# Patient Record
Sex: Female | Born: 1975 | Race: White | Hispanic: Yes | Marital: Married | Smoking: Never smoker
Health system: Southern US, Community
[De-identification: ages and names within clinical notes are randomized; demographics above are authoritative.]

## PROBLEM LIST (undated history)

## (undated) DIAGNOSIS — Z862 Personal history of diseases of the blood and blood-forming organs and certain disorders involving the immune mechanism: Secondary | ICD-10-CM

## (undated) DIAGNOSIS — K8020 Calculus of gallbladder without cholecystitis without obstruction: Secondary | ICD-10-CM

## (undated) DIAGNOSIS — Z8659 Personal history of other mental and behavioral disorders: Secondary | ICD-10-CM

## (undated) DIAGNOSIS — J329 Chronic sinusitis, unspecified: Secondary | ICD-10-CM

## (undated) DIAGNOSIS — L0293 Carbuncle, unspecified: Secondary | ICD-10-CM

## (undated) DIAGNOSIS — Z8759 Personal history of other complications of pregnancy, childbirth and the puerperium: Secondary | ICD-10-CM

## (undated) DIAGNOSIS — Z8669 Personal history of other diseases of the nervous system and sense organs: Secondary | ICD-10-CM

## (undated) DIAGNOSIS — I8390 Asymptomatic varicose veins of unspecified lower extremity: Secondary | ICD-10-CM

## (undated) HISTORY — DX: Calculus of gallbladder without cholecystitis without obstruction: K80.20

## (undated) HISTORY — PX: NO PAST SURGERIES: SHX2092

---

## 2000-12-12 LAB — HEMOGLOBINOPATHY EVALUATION: Hemoglobin, Elp: NORMAL

## 2000-12-19 ENCOUNTER — Ambulatory Visit (HOSPITAL_COMMUNITY): Admission: RE | Admit: 2000-12-19 | Discharge: 2000-12-19 | Payer: Self-pay | Admitting: Obstetrics

## 2001-01-18 ENCOUNTER — Inpatient Hospital Stay (HOSPITAL_COMMUNITY): Admission: AD | Admit: 2001-01-18 | Discharge: 2001-01-19 | Payer: Self-pay | Admitting: Obstetrics

## 2003-03-24 ENCOUNTER — Inpatient Hospital Stay (HOSPITAL_COMMUNITY): Admission: AD | Admit: 2003-03-24 | Discharge: 2003-03-24 | Payer: Self-pay | Admitting: Obstetrics & Gynecology

## 2003-05-20 ENCOUNTER — Encounter: Admission: RE | Admit: 2003-05-20 | Discharge: 2003-05-20 | Payer: Self-pay | Admitting: *Deleted

## 2003-06-10 ENCOUNTER — Encounter: Admission: RE | Admit: 2003-06-10 | Discharge: 2003-06-10 | Payer: Self-pay | Admitting: *Deleted

## 2003-06-17 ENCOUNTER — Encounter: Admission: RE | Admit: 2003-06-17 | Discharge: 2003-06-17 | Payer: Self-pay | Admitting: *Deleted

## 2003-06-22 ENCOUNTER — Inpatient Hospital Stay (HOSPITAL_COMMUNITY): Admission: AD | Admit: 2003-06-22 | Discharge: 2003-06-24 | Payer: Self-pay | Admitting: Family Medicine

## 2003-06-22 ENCOUNTER — Inpatient Hospital Stay (HOSPITAL_COMMUNITY): Admission: AD | Admit: 2003-06-22 | Discharge: 2003-06-22 | Payer: Self-pay | Admitting: Family Medicine

## 2005-05-23 DIAGNOSIS — Z8759 Personal history of other complications of pregnancy, childbirth and the puerperium: Secondary | ICD-10-CM

## 2005-05-23 HISTORY — DX: Personal history of other complications of pregnancy, childbirth and the puerperium: Z87.59

## 2006-01-19 ENCOUNTER — Inpatient Hospital Stay (HOSPITAL_COMMUNITY): Admission: AD | Admit: 2006-01-19 | Discharge: 2006-01-20 | Payer: Self-pay | Admitting: Family Medicine

## 2006-01-20 ENCOUNTER — Inpatient Hospital Stay (HOSPITAL_COMMUNITY): Admission: AD | Admit: 2006-01-20 | Discharge: 2006-01-21 | Payer: Self-pay | Admitting: Gynecology

## 2006-01-22 ENCOUNTER — Encounter (INDEPENDENT_AMBULATORY_CARE_PROVIDER_SITE_OTHER): Payer: Self-pay | Admitting: *Deleted

## 2006-01-22 ENCOUNTER — Inpatient Hospital Stay (HOSPITAL_COMMUNITY): Admission: AD | Admit: 2006-01-22 | Discharge: 2006-01-22 | Payer: Self-pay | Admitting: Obstetrics and Gynecology

## 2006-02-08 ENCOUNTER — Ambulatory Visit: Payer: Self-pay | Admitting: Obstetrics and Gynecology

## 2007-05-23 ENCOUNTER — Inpatient Hospital Stay (HOSPITAL_COMMUNITY): Admission: RE | Admit: 2007-05-23 | Discharge: 2007-05-23 | Payer: Self-pay | Admitting: Obstetrics and Gynecology

## 2007-06-21 ENCOUNTER — Ambulatory Visit (HOSPITAL_COMMUNITY): Admission: RE | Admit: 2007-06-21 | Discharge: 2007-06-21 | Payer: Self-pay | Admitting: Family Medicine

## 2007-08-12 ENCOUNTER — Inpatient Hospital Stay (HOSPITAL_COMMUNITY): Admission: AD | Admit: 2007-08-12 | Discharge: 2007-08-12 | Payer: Self-pay | Admitting: Obstetrics & Gynecology

## 2007-08-12 ENCOUNTER — Ambulatory Visit: Payer: Self-pay | Admitting: Obstetrics and Gynecology

## 2007-09-17 ENCOUNTER — Inpatient Hospital Stay (HOSPITAL_COMMUNITY): Admission: AD | Admit: 2007-09-17 | Discharge: 2007-09-17 | Payer: Self-pay | Admitting: Obstetrics & Gynecology

## 2007-09-17 ENCOUNTER — Ambulatory Visit: Payer: Self-pay | Admitting: *Deleted

## 2007-09-19 ENCOUNTER — Ambulatory Visit: Payer: Self-pay | Admitting: *Deleted

## 2007-09-19 ENCOUNTER — Inpatient Hospital Stay (HOSPITAL_COMMUNITY): Admission: AD | Admit: 2007-09-19 | Discharge: 2007-09-21 | Payer: Self-pay | Admitting: Obstetrics and Gynecology

## 2007-10-27 ENCOUNTER — Inpatient Hospital Stay (HOSPITAL_COMMUNITY): Admission: AD | Admit: 2007-10-27 | Discharge: 2007-10-27 | Payer: Self-pay | Admitting: Obstetrics and Gynecology

## 2007-11-02 ENCOUNTER — Ambulatory Visit (HOSPITAL_COMMUNITY): Admission: RE | Admit: 2007-11-02 | Discharge: 2007-11-02 | Payer: Self-pay | Admitting: Family Medicine

## 2007-11-05 ENCOUNTER — Inpatient Hospital Stay (HOSPITAL_COMMUNITY): Admission: AD | Admit: 2007-11-05 | Discharge: 2007-11-08 | Payer: Self-pay | Admitting: Obstetrics

## 2007-11-05 ENCOUNTER — Ambulatory Visit: Payer: Self-pay | Admitting: *Deleted

## 2008-02-02 ENCOUNTER — Inpatient Hospital Stay (HOSPITAL_COMMUNITY): Admission: AD | Admit: 2008-02-02 | Discharge: 2008-02-02 | Payer: Self-pay | Admitting: Obstetrics and Gynecology

## 2008-04-07 ENCOUNTER — Emergency Department (HOSPITAL_COMMUNITY): Admission: EM | Admit: 2008-04-07 | Discharge: 2008-04-08 | Payer: Self-pay | Admitting: Emergency Medicine

## 2008-05-23 DIAGNOSIS — L0293 Carbuncle, unspecified: Secondary | ICD-10-CM

## 2008-05-23 DIAGNOSIS — Z8659 Personal history of other mental and behavioral disorders: Secondary | ICD-10-CM

## 2008-05-23 DIAGNOSIS — L0292 Furuncle, unspecified: Secondary | ICD-10-CM

## 2008-05-23 HISTORY — DX: Furuncle, unspecified: L02.92

## 2008-05-23 HISTORY — DX: Personal history of other mental and behavioral disorders: Z86.59

## 2008-05-23 HISTORY — DX: Carbuncle, unspecified: L02.93

## 2008-06-17 ENCOUNTER — Emergency Department (HOSPITAL_COMMUNITY): Admission: EM | Admit: 2008-06-17 | Discharge: 2008-06-17 | Payer: Self-pay | Admitting: Emergency Medicine

## 2008-06-23 ENCOUNTER — Ambulatory Visit: Payer: Self-pay | Admitting: Nurse Practitioner

## 2008-06-23 DIAGNOSIS — J45909 Unspecified asthma, uncomplicated: Secondary | ICD-10-CM | POA: Insufficient documentation

## 2008-06-24 LAB — CONVERTED CEMR LAB
ALT: 19 units/L (ref 0–35)
AST: 11 units/L (ref 0–37)
BUN: 11 mg/dL (ref 6–23)
CO2: 24 meq/L (ref 19–32)
Creatinine, Ser: 0.62 mg/dL (ref 0.40–1.20)
Eosinophils Relative: 4 % (ref 0–5)
Glucose, Bld: 76 mg/dL (ref 70–99)
Neutrophils Relative %: 43 % (ref 43–77)
Platelets: 277 10*3/uL (ref 150–400)
RBC: 5.08 M/uL (ref 3.87–5.11)
Total Bilirubin: 0.5 mg/dL (ref 0.3–1.2)
Total Protein: 7.4 g/dL (ref 6.0–8.3)
WBC: 11.3 10*3/uL — ABNORMAL HIGH (ref 4.0–10.5)

## 2008-06-24 IMAGING — US US OB LIMITED
1 series · 13 of 13 positions shown · non-contrast
Comparison: none

OBSTETRICAL ULTRASOUND:
 This ultrasound exam was performed in the [HOSPITAL] Ultrasound Department.  The OB US report was generated in the AS system, and faxed to the ordering physician.  This report is also available in [REDACTED] PACS.

[Series 1: us ob limited · 0.30mm/px · 13 of 13 slices shown]
[im 1/13]
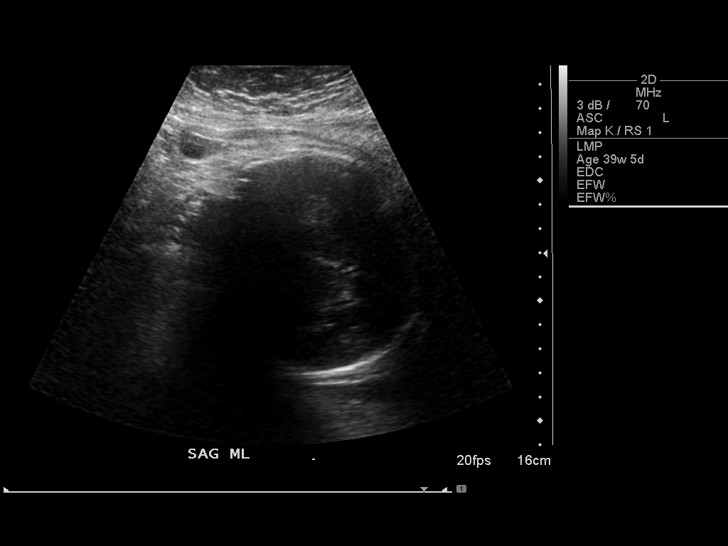
[im 2/13]
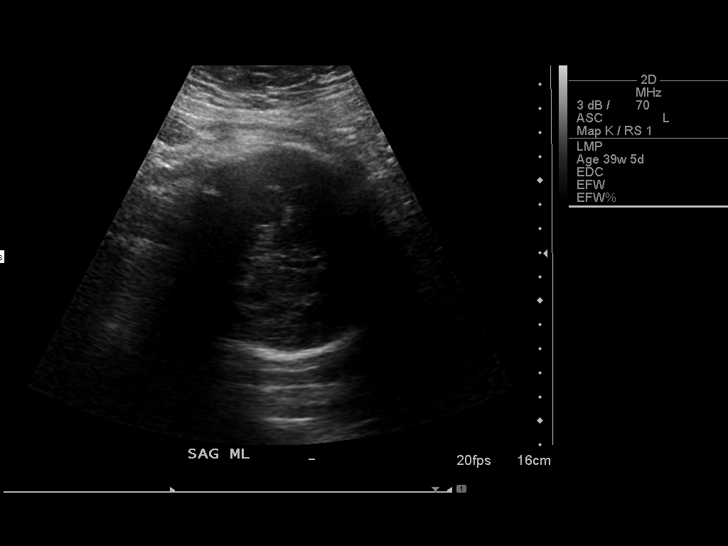
[im 3/13]
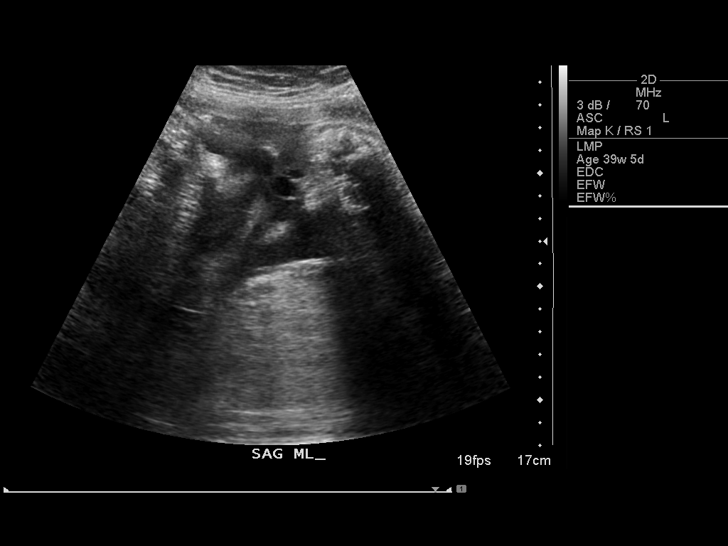
[im 4/13]
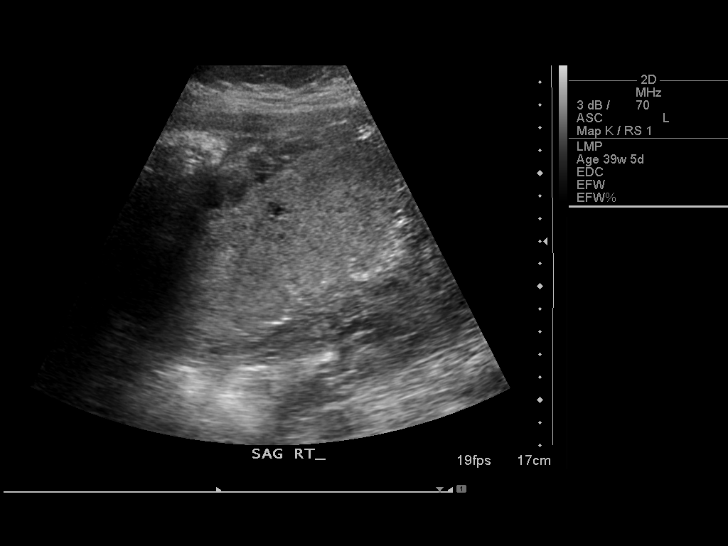
[im 5/13]
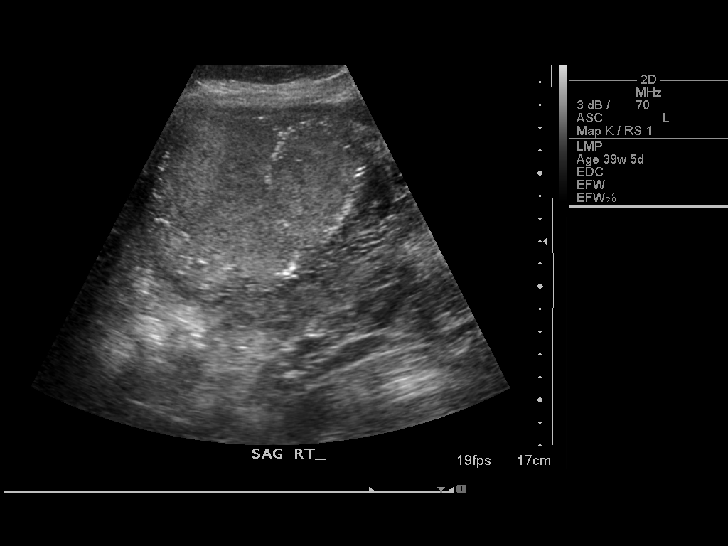
[im 6/13]
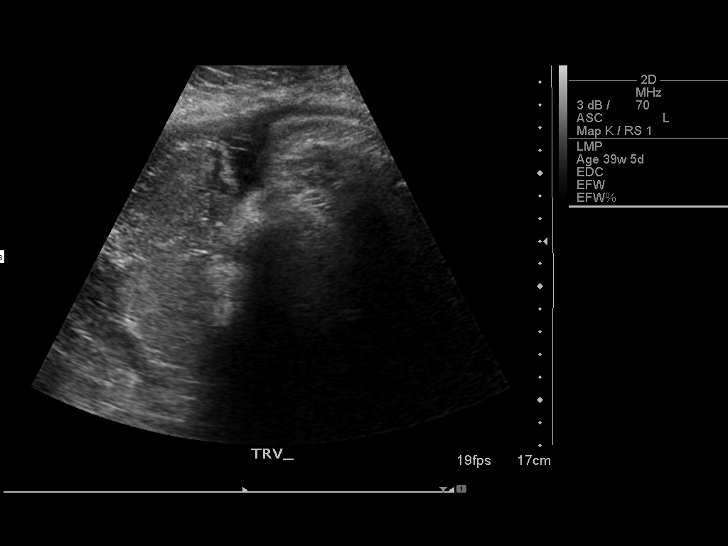
[im 7/13]
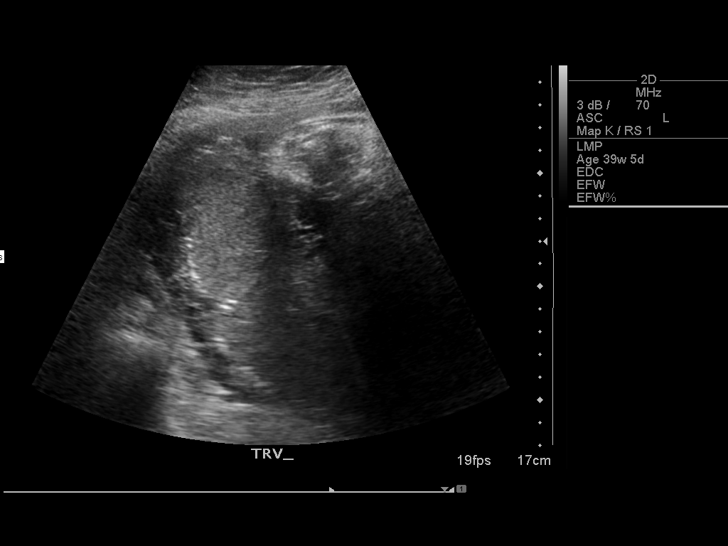
[im 8/13]
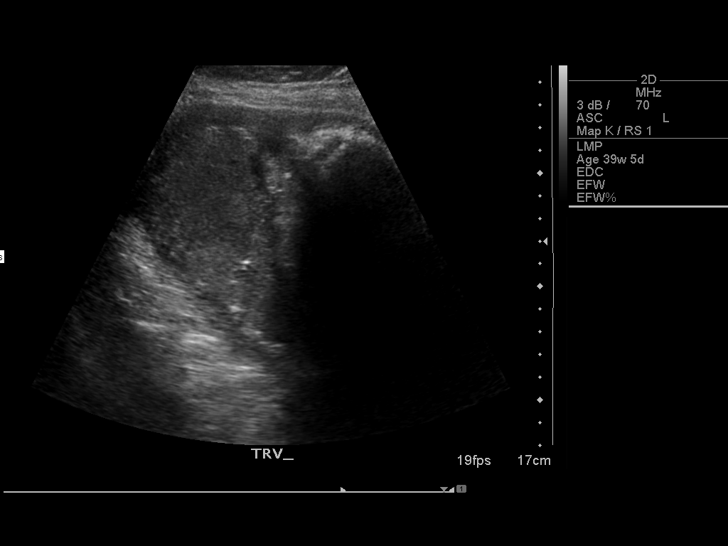
[im 9/13]
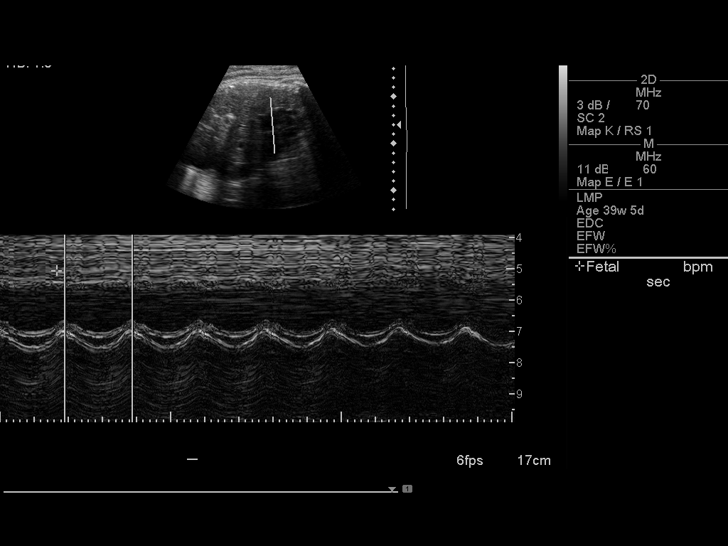
[im 10/13]
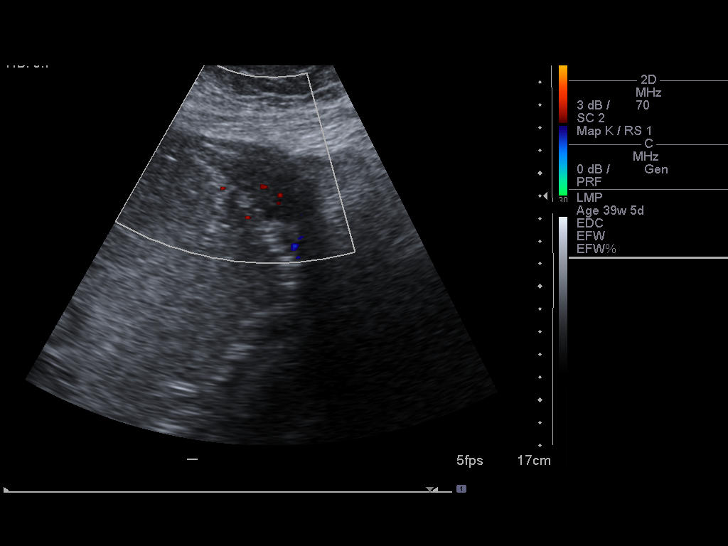
[im 11/13]
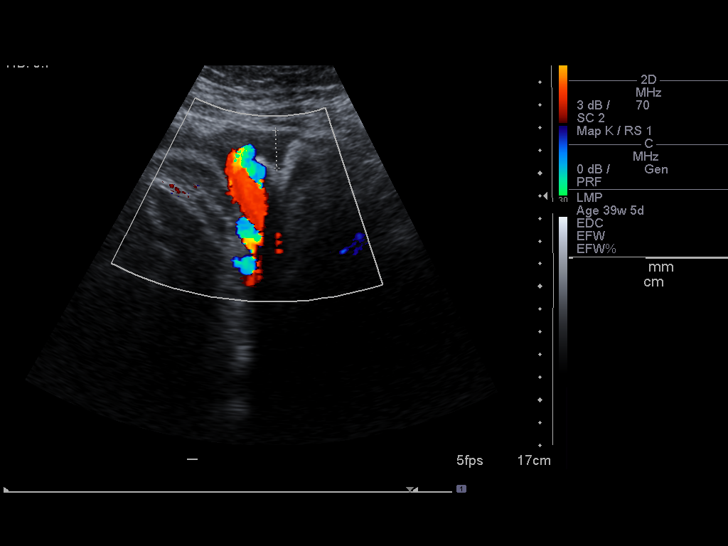
[im 12/13]
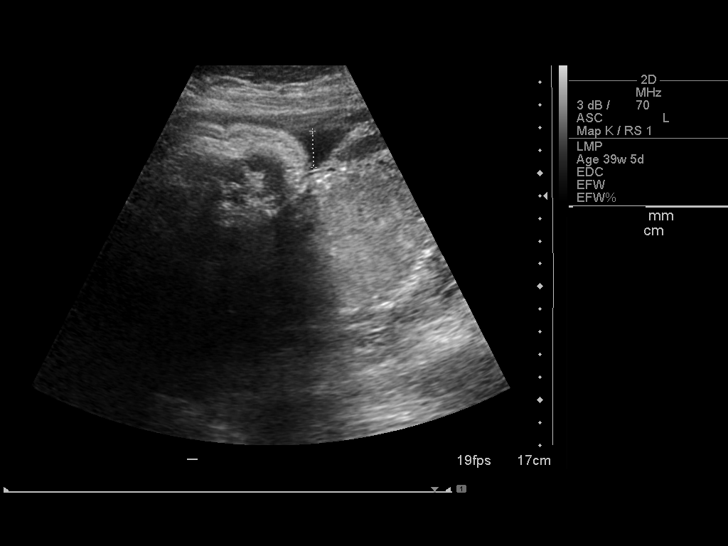
[im 13/13]
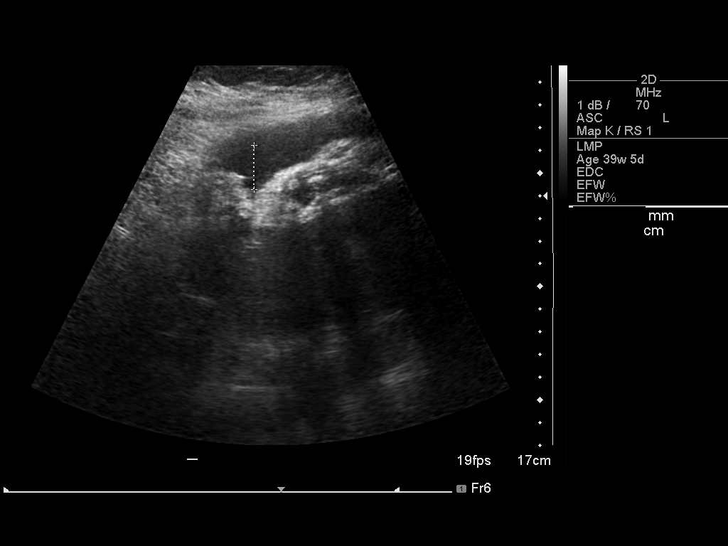

[13 of 13 positions shown; findings below may reference images not displayed]

IMPRESSION: See AS Obstetric US report.

## 2008-06-25 ENCOUNTER — Ambulatory Visit: Payer: Self-pay | Admitting: *Deleted

## 2008-06-26 ENCOUNTER — Encounter (INDEPENDENT_AMBULATORY_CARE_PROVIDER_SITE_OTHER): Payer: Self-pay | Admitting: Nurse Practitioner

## 2008-06-26 ENCOUNTER — Ambulatory Visit (HOSPITAL_COMMUNITY): Admission: RE | Admit: 2008-06-26 | Discharge: 2008-06-26 | Payer: Self-pay | Admitting: Internal Medicine

## 2008-07-18 ENCOUNTER — Telehealth (INDEPENDENT_AMBULATORY_CARE_PROVIDER_SITE_OTHER): Payer: Self-pay | Admitting: Nurse Practitioner

## 2008-08-08 ENCOUNTER — Inpatient Hospital Stay (HOSPITAL_COMMUNITY): Admission: EM | Admit: 2008-08-08 | Discharge: 2008-08-10 | Payer: Self-pay | Admitting: Emergency Medicine

## 2008-08-25 ENCOUNTER — Ambulatory Visit: Payer: Self-pay | Admitting: Nurse Practitioner

## 2008-08-25 DIAGNOSIS — R12 Heartburn: Secondary | ICD-10-CM

## 2008-09-01 ENCOUNTER — Telehealth (INDEPENDENT_AMBULATORY_CARE_PROVIDER_SITE_OTHER): Payer: Self-pay | Admitting: Internal Medicine

## 2008-09-08 ENCOUNTER — Ambulatory Visit: Payer: Self-pay | Admitting: Nurse Practitioner

## 2008-09-08 ENCOUNTER — Encounter (INDEPENDENT_AMBULATORY_CARE_PROVIDER_SITE_OTHER): Payer: Self-pay | Admitting: Nurse Practitioner

## 2008-09-08 DIAGNOSIS — J329 Chronic sinusitis, unspecified: Secondary | ICD-10-CM | POA: Insufficient documentation

## 2008-09-08 LAB — CONVERTED CEMR LAB
Blood in Urine, dipstick: NEGATIVE
Chlamydia, DNA Probe: NEGATIVE
Eosinophils Absolute: 1.1 10*3/uL — ABNORMAL HIGH (ref 0.0–0.7)
GC Probe Amp, Genital: NEGATIVE
Glucose, Urine, Semiquant: NEGATIVE
HCT: 42.6 % (ref 36.0–46.0)
Hemoglobin: 14.2 g/dL (ref 12.0–15.0)
KOH Prep: NEGATIVE
LDL Cholesterol: 72 mg/dL (ref 0–99)
MCHC: 33.3 g/dL (ref 30.0–36.0)
Neutro Abs: 4.3 10*3/uL (ref 1.7–7.7)
Nitrite: NEGATIVE
Protein, U semiquant: 30
Specific Gravity, Urine: 1.02
Urobilinogen, UA: 1
VLDL: 22 mg/dL (ref 0–40)
WBC Urine, dipstick: NEGATIVE
WBC: 8.3 10*3/uL (ref 4.0–10.5)

## 2008-09-09 ENCOUNTER — Encounter (INDEPENDENT_AMBULATORY_CARE_PROVIDER_SITE_OTHER): Payer: Self-pay | Admitting: Nurse Practitioner

## 2008-09-17 ENCOUNTER — Telehealth (INDEPENDENT_AMBULATORY_CARE_PROVIDER_SITE_OTHER): Payer: Self-pay | Admitting: Nurse Practitioner

## 2008-10-22 ENCOUNTER — Ambulatory Visit: Payer: Self-pay | Admitting: Nurse Practitioner

## 2008-10-22 ENCOUNTER — Telehealth (INDEPENDENT_AMBULATORY_CARE_PROVIDER_SITE_OTHER): Payer: Self-pay | Admitting: Nurse Practitioner

## 2008-10-22 DIAGNOSIS — J309 Allergic rhinitis, unspecified: Secondary | ICD-10-CM | POA: Insufficient documentation

## 2008-10-22 DIAGNOSIS — L0293 Carbuncle, unspecified: Secondary | ICD-10-CM

## 2008-10-22 DIAGNOSIS — L0292 Furuncle, unspecified: Secondary | ICD-10-CM | POA: Insufficient documentation

## 2008-11-20 ENCOUNTER — Telehealth (INDEPENDENT_AMBULATORY_CARE_PROVIDER_SITE_OTHER): Payer: Self-pay | Admitting: *Deleted

## 2008-12-15 ENCOUNTER — Ambulatory Visit: Payer: Self-pay | Admitting: Nurse Practitioner

## 2008-12-15 DIAGNOSIS — F5089 Other specified eating disorder: Secondary | ICD-10-CM

## 2008-12-15 LAB — CONVERTED CEMR LAB
Bilirubin Urine: NEGATIVE
Glucose, Urine, Semiquant: NEGATIVE
Ketones, urine, test strip: NEGATIVE
Nitrite: NEGATIVE
Urobilinogen, UA: 0.2
WBC Urine, dipstick: NEGATIVE
pH: 5

## 2009-02-18 ENCOUNTER — Ambulatory Visit: Payer: Self-pay | Admitting: Nurse Practitioner

## 2009-02-18 LAB — CONVERTED CEMR LAB
Blood in Urine, dipstick: NEGATIVE
Nitrite: NEGATIVE

## 2009-02-24 ENCOUNTER — Ambulatory Visit (HOSPITAL_COMMUNITY): Admission: RE | Admit: 2009-02-24 | Discharge: 2009-02-24 | Payer: Self-pay | Admitting: Internal Medicine

## 2009-03-16 ENCOUNTER — Ambulatory Visit: Payer: Self-pay | Admitting: Nurse Practitioner

## 2009-03-17 ENCOUNTER — Encounter (INDEPENDENT_AMBULATORY_CARE_PROVIDER_SITE_OTHER): Payer: Self-pay | Admitting: Nurse Practitioner

## 2009-03-18 ENCOUNTER — Telehealth (INDEPENDENT_AMBULATORY_CARE_PROVIDER_SITE_OTHER): Payer: Self-pay | Admitting: Nurse Practitioner

## 2009-03-18 DIAGNOSIS — D649 Anemia, unspecified: Secondary | ICD-10-CM | POA: Insufficient documentation

## 2009-03-18 LAB — CONVERTED CEMR LAB
Eosinophils Absolute: 0.3 10*3/uL (ref 0.0–0.7)
Hemoglobin: 9.2 g/dL — ABNORMAL LOW (ref 12.0–15.0)
Lymphocytes Relative: 30 % (ref 12–46)
MCHC: 29.3 g/dL — ABNORMAL LOW (ref 30.0–36.0)
MCV: 76.8 fL — ABNORMAL LOW (ref 78.0–100.0)
Monocytes Absolute: 0.6 10*3/uL (ref 0.1–1.0)
Retic Ct Pct: 1 % (ref 0.4–3.1)

## 2009-03-19 ENCOUNTER — Inpatient Hospital Stay (HOSPITAL_COMMUNITY): Admission: AD | Admit: 2009-03-19 | Discharge: 2009-03-19 | Payer: Self-pay | Admitting: Obstetrics & Gynecology

## 2009-03-20 ENCOUNTER — Encounter (INDEPENDENT_AMBULATORY_CARE_PROVIDER_SITE_OTHER): Payer: Self-pay | Admitting: Nurse Practitioner

## 2009-03-20 ENCOUNTER — Telehealth (INDEPENDENT_AMBULATORY_CARE_PROVIDER_SITE_OTHER): Payer: Self-pay | Admitting: Nurse Practitioner

## 2009-03-23 ENCOUNTER — Emergency Department (HOSPITAL_COMMUNITY): Admission: EM | Admit: 2009-03-23 | Discharge: 2009-03-23 | Payer: Self-pay | Admitting: Emergency Medicine

## 2009-03-23 ENCOUNTER — Ambulatory Visit: Payer: Self-pay | Admitting: Nurse Practitioner

## 2009-03-23 ENCOUNTER — Encounter (INDEPENDENT_AMBULATORY_CARE_PROVIDER_SITE_OTHER): Payer: Self-pay | Admitting: Nurse Practitioner

## 2009-03-24 LAB — CONVERTED CEMR LAB
HCT: 32.5 % — ABNORMAL LOW (ref 36.0–46.0)
Hemoglobin: 9.5 g/dL — ABNORMAL LOW (ref 12.0–15.0)
MCHC: 29.2 g/dL — ABNORMAL LOW (ref 30.0–36.0)
Platelets: 278 10*3/uL (ref 150–400)
Retic Ct Pct: 1.4 % (ref 0.4–3.1)
WBC: 7.6 10*3/uL (ref 4.0–10.5)

## 2009-04-08 ENCOUNTER — Ambulatory Visit: Payer: Self-pay | Admitting: Nurse Practitioner

## 2009-04-08 DIAGNOSIS — J069 Acute upper respiratory infection, unspecified: Secondary | ICD-10-CM | POA: Insufficient documentation

## 2009-04-08 LAB — CONVERTED CEMR LAB: Rapid Strep: NEGATIVE

## 2009-04-29 ENCOUNTER — Ambulatory Visit: Payer: Self-pay | Admitting: Nurse Practitioner

## 2009-04-29 LAB — CONVERTED CEMR LAB
Hemoglobin: 13.8 g/dL (ref 12.0–15.0)
MCHC: 31.6 g/dL (ref 30.0–36.0)
Platelets: 251 10*3/uL (ref 150–400)

## 2009-05-05 ENCOUNTER — Telehealth (INDEPENDENT_AMBULATORY_CARE_PROVIDER_SITE_OTHER): Payer: Self-pay | Admitting: *Deleted

## 2009-05-29 ENCOUNTER — Telehealth (INDEPENDENT_AMBULATORY_CARE_PROVIDER_SITE_OTHER): Payer: Self-pay | Admitting: *Deleted

## 2009-06-04 ENCOUNTER — Ambulatory Visit: Payer: Self-pay | Admitting: Nurse Practitioner

## 2009-07-01 ENCOUNTER — Telehealth (INDEPENDENT_AMBULATORY_CARE_PROVIDER_SITE_OTHER): Payer: Self-pay | Admitting: Nurse Practitioner

## 2009-07-03 ENCOUNTER — Ambulatory Visit: Payer: Self-pay | Admitting: Nurse Practitioner

## 2009-07-06 ENCOUNTER — Ambulatory Visit: Payer: Self-pay | Admitting: Nurse Practitioner

## 2009-07-20 ENCOUNTER — Ambulatory Visit: Payer: Self-pay | Admitting: Nurse Practitioner

## 2009-07-20 DIAGNOSIS — R51 Headache: Secondary | ICD-10-CM

## 2009-07-20 DIAGNOSIS — R519 Headache, unspecified: Secondary | ICD-10-CM | POA: Insufficient documentation

## 2009-07-23 ENCOUNTER — Telehealth (INDEPENDENT_AMBULATORY_CARE_PROVIDER_SITE_OTHER): Payer: Self-pay | Admitting: Nurse Practitioner

## 2009-07-23 ENCOUNTER — Ambulatory Visit (HOSPITAL_COMMUNITY): Admission: RE | Admit: 2009-07-23 | Discharge: 2009-07-23 | Payer: Self-pay | Admitting: Internal Medicine

## 2009-07-27 ENCOUNTER — Ambulatory Visit: Payer: Self-pay | Admitting: Internal Medicine

## 2009-08-05 ENCOUNTER — Telehealth (INDEPENDENT_AMBULATORY_CARE_PROVIDER_SITE_OTHER): Payer: Self-pay | Admitting: Nurse Practitioner

## 2009-08-10 ENCOUNTER — Encounter (INDEPENDENT_AMBULATORY_CARE_PROVIDER_SITE_OTHER): Payer: Self-pay | Admitting: Nurse Practitioner

## 2009-09-02 ENCOUNTER — Telehealth (INDEPENDENT_AMBULATORY_CARE_PROVIDER_SITE_OTHER): Payer: Self-pay | Admitting: Nurse Practitioner

## 2009-09-03 ENCOUNTER — Ambulatory Visit: Payer: Self-pay | Admitting: Nurse Practitioner

## 2009-09-03 DIAGNOSIS — B373 Candidiasis of vulva and vagina: Secondary | ICD-10-CM

## 2009-09-03 LAB — CONVERTED CEMR LAB
Beta hcg, urine, semiquantitative: NEGATIVE
Blood in Urine, dipstick: NEGATIVE
KOH Prep: NEGATIVE
Ketones, urine, test strip: NEGATIVE
Nitrite: NEGATIVE
Urobilinogen, UA: 0.2

## 2009-09-28 ENCOUNTER — Ambulatory Visit: Payer: Self-pay | Admitting: Nurse Practitioner

## 2009-10-28 ENCOUNTER — Ambulatory Visit: Payer: Self-pay | Admitting: Nurse Practitioner

## 2009-10-28 DIAGNOSIS — K047 Periapical abscess without sinus: Secondary | ICD-10-CM

## 2009-11-01 ENCOUNTER — Emergency Department (HOSPITAL_COMMUNITY): Admission: EM | Admit: 2009-11-01 | Discharge: 2009-11-01 | Payer: Self-pay | Admitting: Emergency Medicine

## 2009-11-25 ENCOUNTER — Ambulatory Visit: Payer: Self-pay | Admitting: Obstetrics and Gynecology

## 2009-11-25 ENCOUNTER — Inpatient Hospital Stay (HOSPITAL_COMMUNITY): Admission: AD | Admit: 2009-11-25 | Discharge: 2009-11-25 | Payer: Self-pay | Admitting: Obstetrics & Gynecology

## 2009-12-21 ENCOUNTER — Telehealth (INDEPENDENT_AMBULATORY_CARE_PROVIDER_SITE_OTHER): Payer: Self-pay | Admitting: Nurse Practitioner

## 2009-12-28 ENCOUNTER — Ambulatory Visit: Payer: Self-pay | Admitting: Nurse Practitioner

## 2009-12-28 ENCOUNTER — Telehealth (INDEPENDENT_AMBULATORY_CARE_PROVIDER_SITE_OTHER): Payer: Self-pay | Admitting: Nurse Practitioner

## 2009-12-31 ENCOUNTER — Telehealth (INDEPENDENT_AMBULATORY_CARE_PROVIDER_SITE_OTHER): Payer: Self-pay | Admitting: Nurse Practitioner

## 2010-01-01 ENCOUNTER — Telehealth (INDEPENDENT_AMBULATORY_CARE_PROVIDER_SITE_OTHER): Payer: Self-pay | Admitting: Nurse Practitioner

## 2010-01-05 ENCOUNTER — Emergency Department (HOSPITAL_COMMUNITY): Admission: EM | Admit: 2010-01-05 | Discharge: 2010-01-05 | Payer: Self-pay | Admitting: Emergency Medicine

## 2010-01-13 ENCOUNTER — Emergency Department (HOSPITAL_COMMUNITY): Admission: EM | Admit: 2010-01-13 | Discharge: 2010-01-13 | Payer: Self-pay | Admitting: Emergency Medicine

## 2010-02-17 ENCOUNTER — Ambulatory Visit: Payer: Self-pay | Admitting: Nurse Practitioner

## 2010-02-17 LAB — CONVERTED CEMR LAB
Albumin: 4.3 g/dL (ref 3.5–5.2)
Alkaline Phosphatase: 101 units/L (ref 39–117)
BUN: 13 mg/dL (ref 6–23)
CO2: 20 meq/L (ref 19–32)
Calcium: 9.1 mg/dL (ref 8.4–10.5)
Chlamydia, DNA Probe: NEGATIVE
Chloride: 104 meq/L (ref 96–112)
Eosinophils Absolute: 0.6 10*3/uL (ref 0.0–0.7)
GC Probe Amp, Genital: NEGATIVE
Glucose, Bld: 88 mg/dL (ref 70–99)
HDL goal, serum: 40 mg/dL
Ketones, urine, test strip: NEGATIVE
LDL Goal: 160 mg/dL
Lymphocytes Relative: 35 % (ref 12–46)
Lymphs Abs: 3.3 10*3/uL (ref 0.7–4.0)
Monocytes Relative: 6 % (ref 3–12)
Neutro Abs: 4.9 10*3/uL (ref 1.7–7.7)
Neutrophils Relative %: 53 % (ref 43–77)
Nitrite: NEGATIVE
Platelets: 196 10*3/uL (ref 150–400)
Potassium: 3.7 meq/L (ref 3.5–5.3)
RBC: 4.49 M/uL (ref 3.87–5.11)
Sodium: 139 meq/L (ref 135–145)
Specific Gravity, Urine: <1.005
Total Protein: 6.8 g/dL (ref 6.0–8.3)
Urobilinogen, UA: 0.2
WBC Urine, dipstick: NEGATIVE
WBC: 9.3 10*3/uL (ref 4.0–10.5)

## 2010-02-18 ENCOUNTER — Encounter (INDEPENDENT_AMBULATORY_CARE_PROVIDER_SITE_OTHER): Payer: Self-pay | Admitting: Nurse Practitioner

## 2010-02-23 LAB — CONVERTED CEMR LAB: Pap Smear: NEGATIVE

## 2010-03-12 IMAGING — CT CT HEAD W/O CM
1 series · 16 of 30 positions shown, 20 images · non-contrast
Comparison: None

CLINICAL DATA: Headache

CT HEAD WITHOUT CONTRAST
TECHNIQUE: Contiguous axial images were obtained from the base of
the skull through the vertex without contrast.

[Series 2: head routine 4.8 h37s · axial · 0.43mm/px · z∈[-162,-33]mm · 16 of 30 slices shown, 20 images]
[im 2/30  brain]
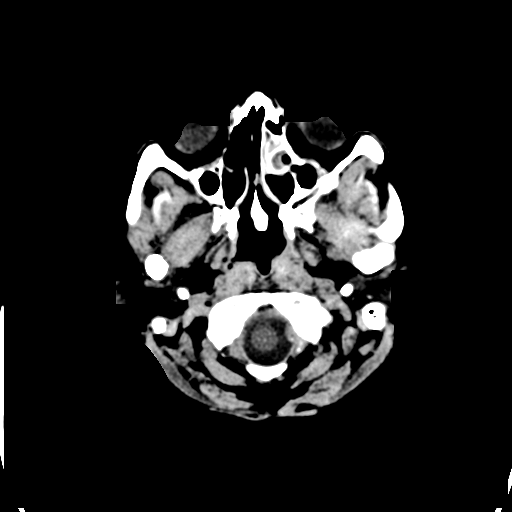
[im 2/30  bone]
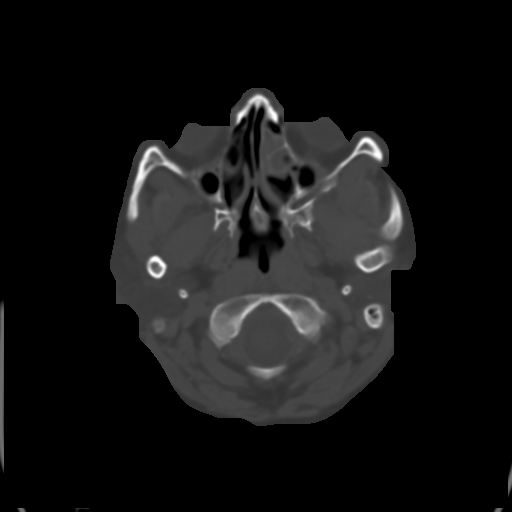
[im 4/30  brain]
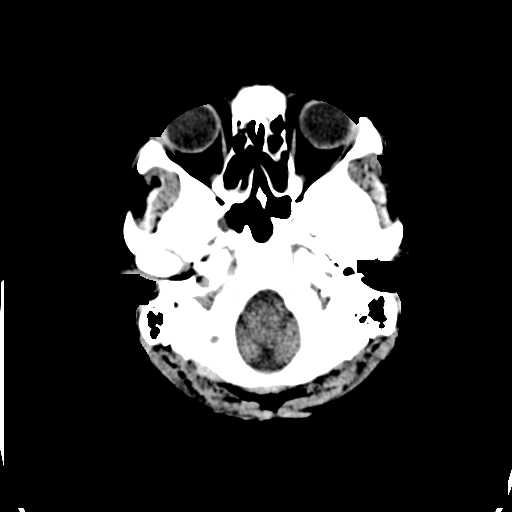
[im 6/30  brain]
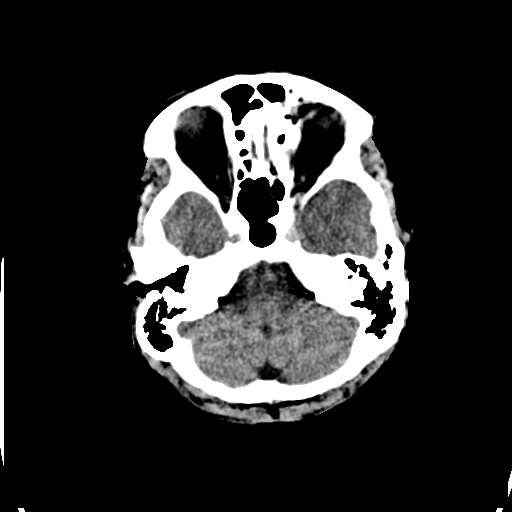
[im 8/30  brain]
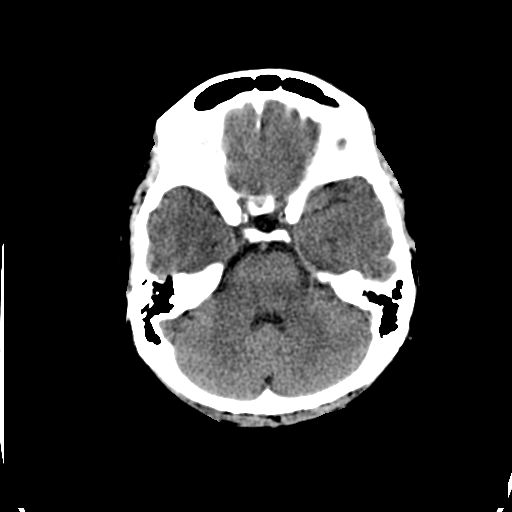
[im 9/30  brain]
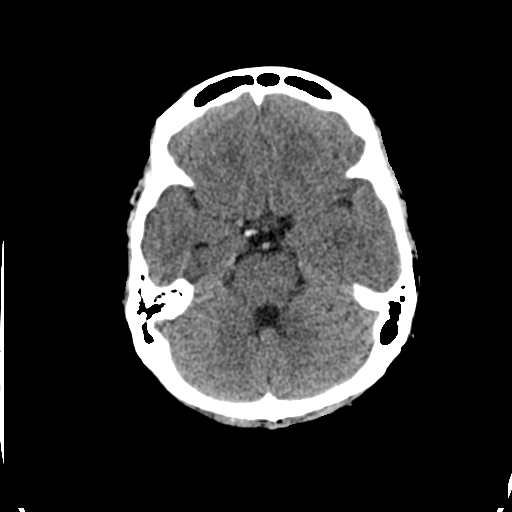
[im 9/30  bone]
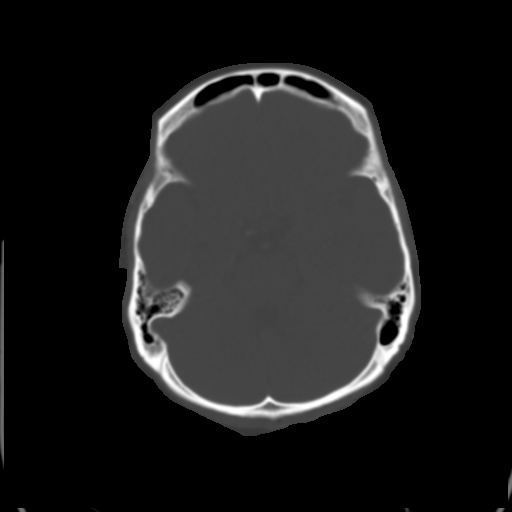
[im 11/30  brain]
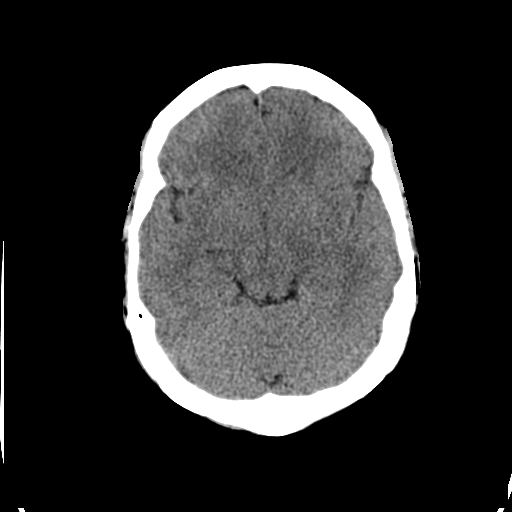
[im 13/30  brain]
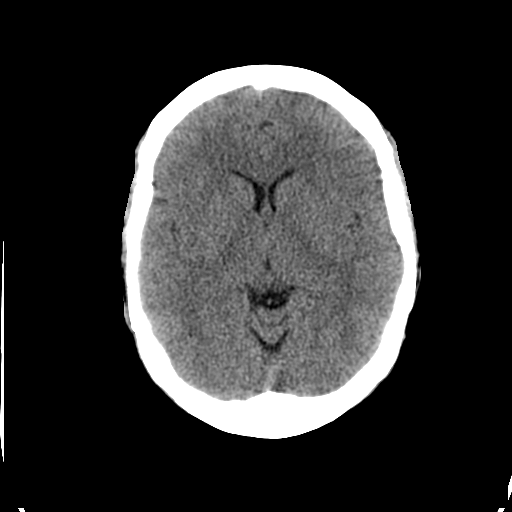
[im 15/30  brain]
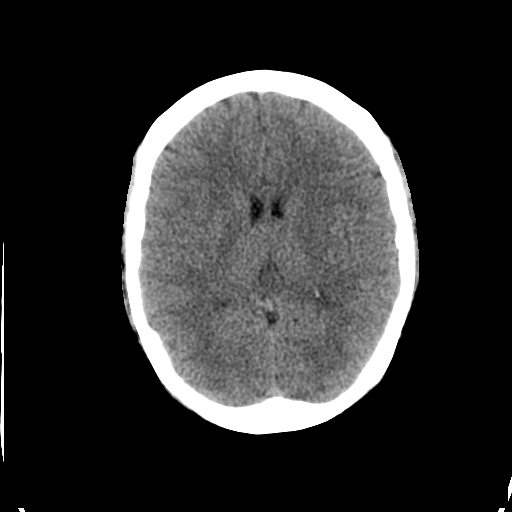
[im 16/30  brain]
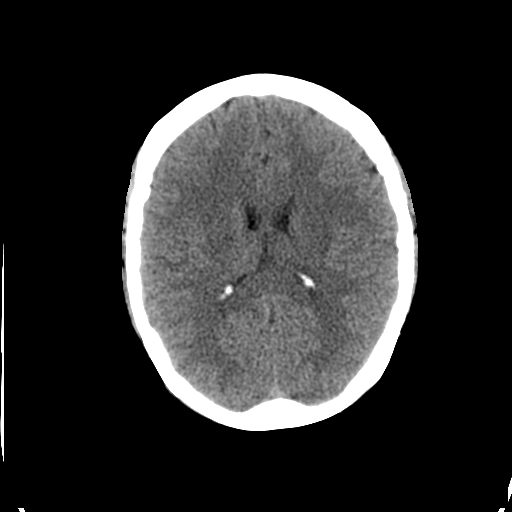
[im 16/30  bone]
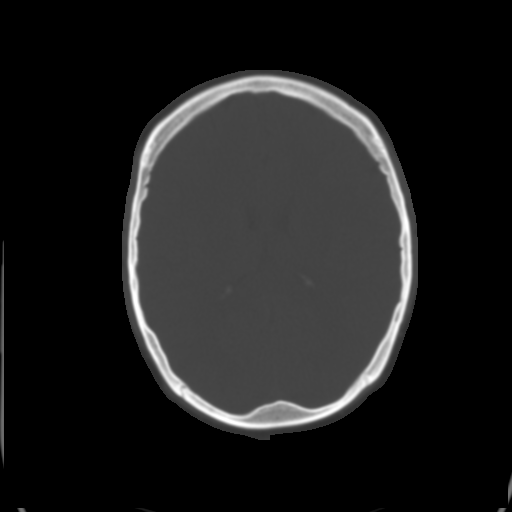
[im 18/30  brain]
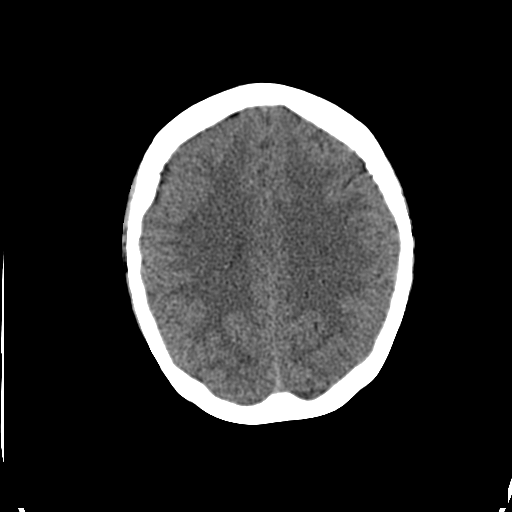
[im 20/30  brain]
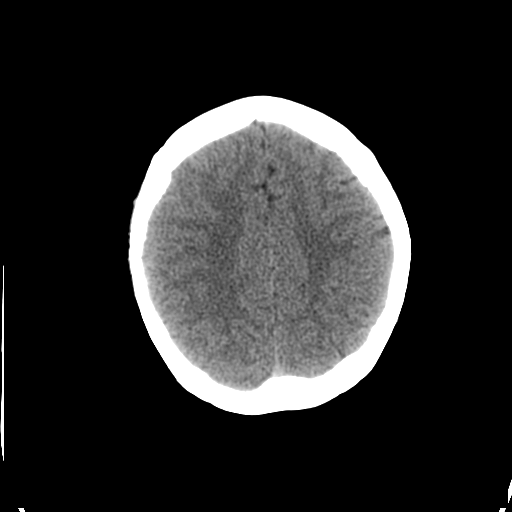
[im 22/30  brain]
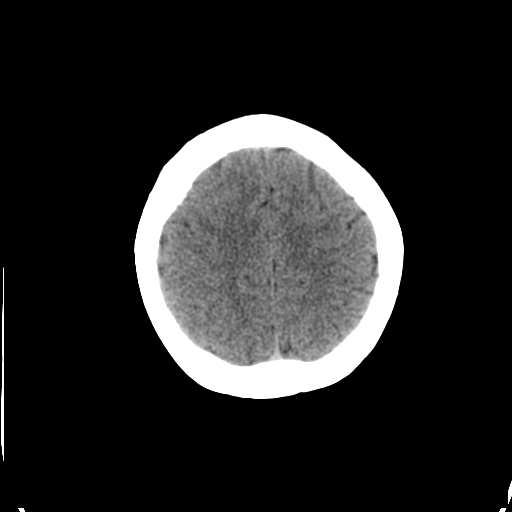
[im 23/30  brain]
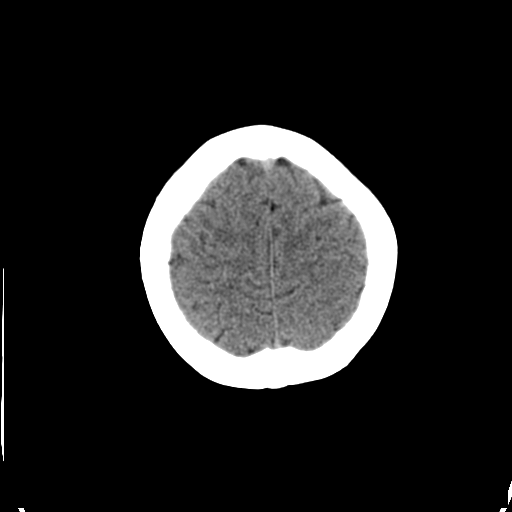
[im 23/30  bone]
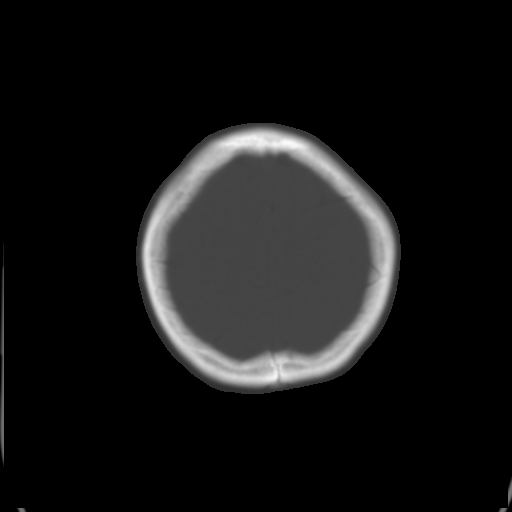
[im 25/30  brain]
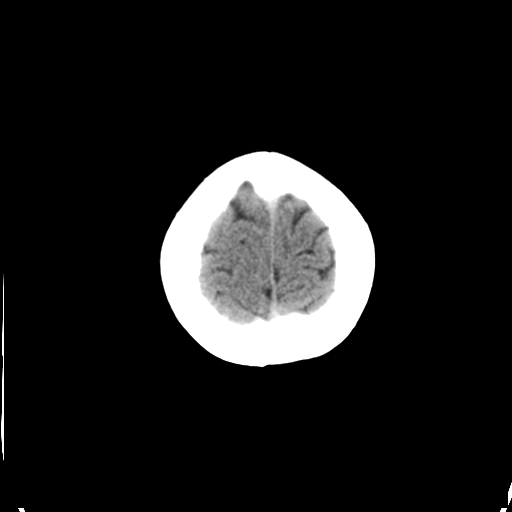
[im 27/30  brain]
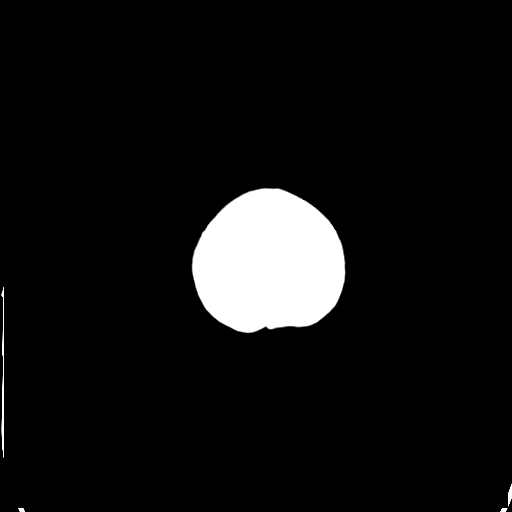
[im 29/30  brain]
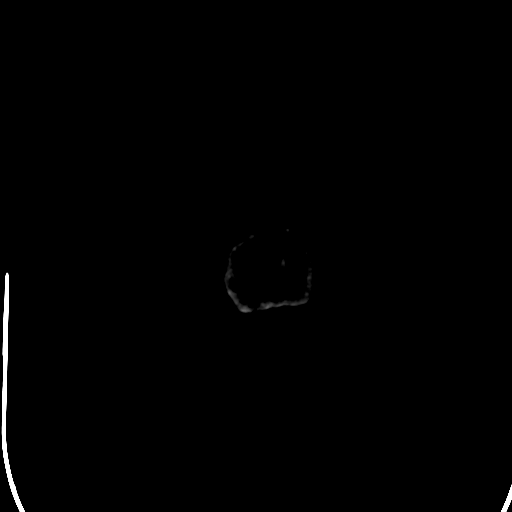

[16 of 30 positions shown; findings below may reference images not displayed]

FINDINGS: The brain has a normal appearance without evidence of
malformation, atrophy, old or acute infarction, mass lesion,
hemorrhage, hydrocephalus or extra-axial collection.  The calvarium
is unremarkable.  Middle ears and mastoids are clear.  There are
changes of sinusitis affecting the ethmoid, sphenoid and frontal
sinuses.
IMPRESSION: Normal appearance of the brain.  Paranasal sinusitis.

## 2010-04-01 ENCOUNTER — Telehealth (INDEPENDENT_AMBULATORY_CARE_PROVIDER_SITE_OTHER): Payer: Self-pay | Admitting: Nurse Practitioner

## 2010-04-01 ENCOUNTER — Ambulatory Visit: Payer: Self-pay | Admitting: Nurse Practitioner

## 2010-04-01 LAB — CONVERTED CEMR LAB
HDL: 41 mg/dL (ref >39)
LDL Cholesterol: 77 mg/dL (ref 0–99)
Total CHOL/HDL Ratio: 3.3

## 2010-04-02 ENCOUNTER — Encounter (INDEPENDENT_AMBULATORY_CARE_PROVIDER_SITE_OTHER): Payer: Self-pay | Admitting: Nurse Practitioner

## 2010-04-29 ENCOUNTER — Emergency Department (HOSPITAL_COMMUNITY): Admission: EM | Admit: 2010-04-29 | Discharge: 2009-10-27 | Payer: Self-pay | Admitting: Emergency Medicine

## 2010-05-03 ENCOUNTER — Telehealth (INDEPENDENT_AMBULATORY_CARE_PROVIDER_SITE_OTHER): Payer: Self-pay | Admitting: Nurse Practitioner

## 2010-05-20 ENCOUNTER — Encounter (INDEPENDENT_AMBULATORY_CARE_PROVIDER_SITE_OTHER): Payer: Self-pay | Admitting: Nurse Practitioner

## 2010-05-20 ENCOUNTER — Ambulatory Visit
Admission: RE | Admit: 2010-05-20 | Discharge: 2010-05-20 | Payer: Self-pay | Source: Home / Self Care | Attending: Nurse Practitioner | Admitting: Nurse Practitioner

## 2010-05-20 DIAGNOSIS — B999 Unspecified infectious disease: Secondary | ICD-10-CM | POA: Insufficient documentation

## 2010-05-20 LAB — CONVERTED CEMR LAB
Beta hcg, urine, semiquantitative: NEGATIVE
Rapid Strep: NEGATIVE

## 2010-05-23 DIAGNOSIS — Z8669 Personal history of other diseases of the nervous system and sense organs: Secondary | ICD-10-CM

## 2010-05-23 HISTORY — DX: Personal history of other diseases of the nervous system and sense organs: Z86.69

## 2010-05-23 NOTE — L&D Delivery Note (Cosign Needed)
Delivery Note At 5:12 PM a viable female was delivered via Vaginal, Spontaneous Delivery (Presentation: Left Occiput Anterior).  Nuchal cord x1, delivered through cord. APGAR: 6, 9; weight 8 lb 2.7 oz (3705 g).   Placenta status: Intact, Spontaneous.  Pt had moderate bleeding after delivery of placenta and so rcvd cytotec to good effect.  Cord: 3 vessels with the following complications: None.    Anesthesia: Epidural  Episiotomy: None Lacerations: None Suture Repair: n/a Est. Blood Loss (mL): 500  Mom to postpartum.  Baby to nursery-stable.  Maren Reamer, Camrin Lapre 05/12/2011, 5:57 PM

## 2010-06-11 ENCOUNTER — Encounter (INDEPENDENT_AMBULATORY_CARE_PROVIDER_SITE_OTHER): Payer: Self-pay | Admitting: Nurse Practitioner

## 2010-06-11 ENCOUNTER — Ambulatory Visit
Admission: RE | Admit: 2010-06-11 | Discharge: 2010-06-11 | Payer: Self-pay | Source: Home / Self Care | Attending: Nurse Practitioner | Admitting: Nurse Practitioner

## 2010-06-11 DIAGNOSIS — H6690 Otitis media, unspecified, unspecified ear: Secondary | ICD-10-CM | POA: Insufficient documentation

## 2010-06-22 NOTE — Letter (Signed)
Summary: Meghan Mclean   Imported By: Arta Bruce 10/28/2009 12:06:45  _____________________________________________________________________  External Attachment:    Type:   Image     Comment:   External Document

## 2010-06-22 NOTE — Progress Notes (Signed)
Summary: ? about birth control  Phone Note Call from Patient   Complaint: Urinary/GYN Problems Summary of Call: The pt went to the [facility] because apparently she had an appointment at 12:45 pm but when she went her name was not registered for an office visit.   I gave the number from the other location to contact Gavin Pound for further information.Manon Hilding  September 02, 2009 2:29 PM Initial call taken by: Manon Hilding,  September 02, 2009 2:30 PM  Follow-up for Phone Call        See phone note dated 09-02-09 @ 3:14pm Follow-up by: Candi Leash,  September 02, 2009 3:16 PM  Additional Follow-up for Phone Call Additional follow up Details #1::        Graciela, please read phone note 09/02/09 per Deb.Marland KitchenMarland KitchenMikey College CMA  September 03, 2009 10:51 AM   i spoke with Stanton Kidney yesterday and she explain everything to me in  reference of the pt.  Today I called the pt back  and she is aware of the cost tube ligation surgery, which is  ( $5000) and will look forward for other options).. she will probably has an aswere by today on her office visit appointment .Manon Hilding  September 03, 2009 11:45 AM    Additional Follow-up for Phone Call Additional follow up Details #2::    pt seen in office today Follow-up by: Lehman Prom FNP,  September 03, 2009 2:30 PM

## 2010-06-22 NOTE — Letter (Signed)
Summary: Handout Printed  Printed Handout:  - Vaginitis-Brief 

## 2010-06-22 NOTE — Assessment & Plan Note (Signed)
Summary: Acute - Candidiasis   Vital Signs:  Patient profile:   35 year old female Menstrual status:  regular Height:      59.5 inches Weight:      155 pounds BMI:     30.89 Temp:     97.9 degrees F oral Pulse rate:   101 / minute Pulse rhythm:   regular Resp:     18 per minute BP sitting:   118 / 79  (left arm) Cuff size:   regular  Vitals Entered By: [place] Shannon (September 03, 2009 2:16 PM) CC: vaginal infection...., Vaginal discharge Is Patient Diabetic? No Pain Assessment Patient in pain? no       Does patient need assistance? Functional Status Self care Ambulation Normal   Visit Type:  acute Primary Care Provider:  Lehman Prom FNP  CC:  vaginal infection.... and Vaginal discharge.  History of Present Illness:  Pt into the office to discuss contraceptions options she wanted a tubal ligation but she was not able to get the operation due to cost She not returns to consider option She is considering the IUD which would be good for 5 years. She had an IUD previously that last for 10 years and pt had it removed at the end of the 10 year. Menses - none for the past 3 months. Prior to that time she was regular, monthly menses. She has 6 children.  She is currently using condoms.   Vaginal discharge      This is a 35 year old woman who presents with Vaginal discharge.  The symptoms began 3 days ago ago.  The severity is described as mild.  The patient complains of itching and vaginal burning, but denies burning on urination, fever, and back pain.  The discharge is described as white.  Risk factors for vaginal discharge include missed period.  The patient denies the following symptoms: genital sores.    Language line used for Spanish interpreter  Current Medications (verified): 1)  Ventolin Hfa 108 (90 Base) Mcg/act Aers (Albuterol Sulfate) .... 2 Puffs Ever 6 Hours As Needed For Shortness of Breath 2)  Singulair 10 Mg Tabs (Montelukast Sodium) .Marland Kitchen.. 1 Tablet By  Mouth At Night For Asthma 3)  Advair Diskus 250-50 Mcg/dose Aepb (Fluticasone-Salmeterol) .... One Inhalation Two Times A Day  *note Change in Dose** 4)  Nexium 40 Mg Cpdr (Esomeprazole Magnesium) .... One Tablet By Mouth Daily Before Breakfast For Stomach 5)  Allegra-D 12 Hour 60-120 Mg Xr12h-Tab (Fexofenadine-Pseudoephedrine) .... One Tablet By Mouth Two Times A Day For Allergies 6)  Multivitamins  Tabs (Multiple Vitamin) .Marland Kitchen.. 1 Tablet By Mouth Daily 7)  Fluticasone Propionate 0.05 % Crea (Fluticasone Propionate) .... One Spray in Each Nostril Two Times A Day 8)  Ferrous Sulfate 325 (65 Fe) Mg Tbec (Ferrous Sulfate) .... One Tablet By Mouth Three Times A Day 9)  Calcium + D 600-200 Mg-Unit Tabs (Calcium Carbonate-Vitamin D) .... One Tablet By Mouth Two Times A Day 10)  Amoxicillin-Pot Clavulanate 875-125 Mg Tabs (Amoxicillin-Pot Clavulanate) .... One Tablet By Mouth Two Times A Day For Infection  Allergies (verified): 1)  ! Ibuprofen  Review of Systems General:  Denies fever. ENT:  Complains of nasal congestion. CV:  Denies chest pain or discomfort. Resp:  Denies cough and shortness of breath. GI:  Denies abdominal pain, nausea, and vomiting. GU:  Complains of discharge.  Physical Exam  General:  alert.   Head:  normocephalic.   Nose:  nasal congestion - inflammed  turbinates Lungs:  normal breath sounds.   no wheezes Heart:  normal rate and regular rhythm.   Msk:  up to the exam Neurologic:  alert & oriented X3.   Skin:  color normal.   Psych:  Oriented X3.     Impression & Recommendations:  Problem # 1:  FAMILY PLANNING (ICD-V25.09) will refer to [facility] - possible for IUD placement pt given info so she can call to make appt  Problem # 2:  ALLERGIC RHINITIS (ICD-477.9) pt to continue her meds would encourage that she purchase a netti pot Her updated medication list for this problem includes:    Nasacort Aq 55 Mcg/act Aers (Triamcinolone acetonide(nasal)) ..... One spray  in each nostril two times a day **d/c flonase**  Problem # 3:  CANDIDIASIS OF VULVA AND VAGINA (ICD-112.1)  handout given to pt Her updated medication list for this problem includes:    Fluconazole 150 Mg Tabs (Fluconazole) ..... One tablet by mouth x 1 day  Orders: KOH/ WET Mount [phone]) UA Dipstick w/o Micro (manual) (29562) Urine Pregnancy Test  (13086)  Complete Medication List: 1)  Ventolin Hfa 108 (90 Base) Mcg/act Aers (Albuterol sulfate) .... 2 puffs ever 6 hours as needed for shortness of breath 2)  Singulair 10 Mg Tabs (Montelukast sodium) .Marland Kitchen.. 1 tablet by mouth at night for asthma 3)  Advair Diskus 250-50 Mcg/dose Aepb (Fluticasone-salmeterol) .... One inhalation two times a day  *note change in dose** 4)  Nexium 40 Mg Cpdr (Esomeprazole magnesium) .... One tablet by mouth daily before breakfast for stomach 5)  Allegra-d 12 Hour 60-120 Mg Xr12h-tab (Fexofenadine-pseudoephedrine) .... One tablet by mouth two times a day for allergies 6)  Multivitamins Tabs (Multiple vitamin) .Marland Kitchen.. 1 tablet by mouth daily 7)  Ferrous Sulfate 325 (65 Fe) Mg Tbec (Ferrous sulfate) .... One tablet by mouth three times a day 8)  Calcium + D 600-200 Mg-unit Tabs (Calcium carbonate-vitamin d) .... One tablet by mouth two times a day 9)  Nasacort Aq 55 Mcg/act Aers (Triamcinolone acetonide(nasal)) .... One spray in each nostril two times a day **d/c flonase** 10)  Fluconazole 150 Mg Tabs (Fluconazole) .... One tablet by mouth x 1 day  Patient Instructions: 1)  You will need to buy a NETTI POT from the pharmacy.  Using this will help you clean out your sinuses. You can buy this from a pharmacy such as [facility] 2)  Your nasal spray has been changed to nasacort.  A new prescription has been sent to the pharmacy. 3)  This time of the year with the pollen and flowers your sinuses are really irriated 4)  Family planning - keep using condoms for now. 5)  Contact the health department to see if  you can get an IUD placed. 6)  You have a yeast infection.  Take Fluconazole by mouth x 1 dose 7)  Follow up as needed Prescriptions: FLUCONAZOLE 150 MG TABS (FLUCONAZOLE) One tablet by mouth x 1 day  #1 x 0   Entered and Authorized by:   Lehman Prom FNP   Signed by:   Lehman Prom FNP on 09/03/2009   Method used:   Print then Give to Patient   RxID:   [id number] NASACORT AQ 55 MCG/ACT AERS (TRIAMCINOLONE ACETONIDE(NASAL)) One spray in each nostril two times a day **D/C flonase**  #1 x 3   Entered and Authorized by:   Lehman Prom FNP   Signed by:   Lehman Prom FNP on 09/03/2009   Method  used:   Faxed to ...       [facility] (retail)       [address] [place], [place]  [address]       Ph: [phone]       Fax: [phone]   RxID:   [id number]   Laboratory Results   Urine Tests  Date/Time Received: September 03, 2009 3:07 PM   Routine Urinalysis   Glucose: negative   (Normal Range: Negative) Bilirubin: negative   (Normal Range: Negative) Ketone: negative   (Normal Range: Negative) Spec. Gravity: <1.005   (Normal Range: 1.003-1.035) Blood: negative   (Normal Range: Negative) pH: 6.5   (Normal Range: 5.0-8.0) Protein: negative   (Normal Range: Negative) Urobilinogen: 0.2   (Normal Range: 0-1) Nitrite: negative   (Normal Range: Negative) Leukocyte Esterace: trace   (Normal Range: Negative)    Urine HCG: negative Date/Time Received: September 03, 2009 2:52 PM   [employer] Source: vaginal WBC/hpf: 1-5 Bacteria/hpf: rare Clue cells/hpf: none Yeast/hpf: few Wet [place] KOH: Negative Trichomonas/hpf: none

## 2010-06-22 NOTE — Progress Notes (Signed)
Summary: REFILL MEDS  Phone Note Call from Patient   Caller: Patient Reason for Call: Refill Medication Summary of Call: PT WENT TO [facility] TO REFILL HER MEDS AND THEY TOLD HER THAT HER PROVIDER NEED TO FAX THE ORDER . Fuller Song PT @ [phone]  [facility] YOU  Initial call taken by: Cheryll Dessert,  December 21, 2009 9:40 AM  Follow-up for Phone Call        received request for ventolin sent electronically today to [facility] pharmacy Follow-up by: Lehman Prom FNP,  December 22, 2009 8:37 AM  Additional Follow-up for Phone Call Additional follow up Details #1::        called [phone] the number you have called is unavailable please call back later. Levon Hedger  December 22, 2009 9:45 AM  called [phone] the number you have called is unavailable please call back later. Levon Hedger  December 24, 2009 12:40 PM     Additional Follow-up for Phone Call Additional follow up Details #2::    pt into the office today - will review with pt Follow-up by: Lehman Prom FNP,  December 28, 2009 12:33 PM  Prescriptions: VENTOLIN HFA 108 (90 BASE) MCG/ACT AERS (ALBUTEROL SULFATE) 2 puffs ever 6 hours as needed for shortness of breath  #1 x 3   Entered and Authorized by:   Lehman Prom FNP   Signed by:   Lehman Prom FNP on 12/22/2009   Method used:   Faxed to ...       [facility] (retail)       [address] [place], [place]  [address]       Ph: [phone]       Fax: [phone]   RxID:   [id number]

## 2010-06-22 NOTE — Letter (Signed)
Summary: *HSN Results Follow up  [facility]  [address] [place], [place] [address]   Phone: [phone]  Fax: [phone]      02/18/2010   EVANN KOELZER [address] [place], [place]  [address]   Dear  Ms. Annaliz MARCIAL-GARCIA,                            ____S.Drinkard,FNP   ____D. Gore,FNP       ____B. McPherson,MD   ____V. Rankins,MD    ____E. Mulberry,MD    __X__N. Daphine Deutscher, FNP  ____D. Reche Dixon, MD    ____K. Philipp Deputy, MD    ____Other     This letter is to inform you that your recent test(s):  ___X____Pap Smear    ___X____Lab Test     _______X-ray    ___X____ is within acceptable limits  _______ requires a medication change  _______ requires a follow-up lab visit  _______ requires a follow-up visit with your provider   Comments: Labs done during recent office visit are normal.  PAP Smear results __________________________.       _________________________________________________________ If you have any questions, please contact our office [phone].                    Sincerely,    Lehman Prom FNP [facility]

## 2010-06-22 NOTE — Progress Notes (Signed)
Summary: Feeling sick/ acute issues//gk  Phone Note Call from Patient   Summary of Call: Since last week, pt had been suffering with ear pain  and blurred vision along with watering eye.  Indeed, she has headache and dizziness.  Pt was looking to be seen today but there is nothing available. [facility] FNP Initial call taken by: Manon Hilding,  December 28, 2009 11:28 AM  Follow-up for Phone Call        Put in acute appt. for Jesse Fall.  Dutch Quint RN  December 28, 2009 11:45 AM   Additional Follow-up for Phone Call Additional follow up Details #1::        will see pt in noon slot be sure that acute pt's are being phoned for updates regarding symptoms.   Additional Follow-up by: Lehman Prom FNP,  December 28, 2009 11:56 AM     Appended Document: F/u on Acute visit Called pt. for f/u -- states she feels about the same as when she saw provider.  Taking all meds as order, including Ceron-DM for congestion.  States she has headaches that are "just there", but at times feels like her head is "going to explode" and she gets dizzy, but Tylenol and the Ceron-DM help a little.  Still having nasal drainage, which is watery and clear.  Also c/o CP to left side of chest, non-radiating, denies SOB .  Advised to continue meds as ordered, to call back if symptoms worsen.  Dutch Quint RN  December 30, 2009 9:41 AM

## 2010-06-22 NOTE — Assessment & Plan Note (Signed)
Summary: Complete Physical Exam   Vital Signs:  Patient profile:   35 year old female Menstrual status:  regular LMP:     02/08/2010 Weight:      156.2 pounds BMI:     31.13 Temp:     97.6 degrees F oral Pulse rate:   76 / minute Pulse rhythm:   regular Resp:     20 per minute BP sitting:   104 / 76  (left arm) Cuff size:   regular  Vitals Entered By: Levon Hedger (February 17, 2010 3:13 PM)  Nutrition Counseling: Patient's BMI is greater than 25 and therefore counseled on weight management options. CC: Lipid Management Is Patient Diabetic? No Pain Assessment Patient in pain? no       Does patient need assistance? Functional Status Self care Ambulation Normal LMP (date): 02/08/2010 LMP - Character: normal     Enter LMP: 02/08/2010 Last PAP Result  Specimen Adequacy: Satisfactory for evaluation.   Interpretation/Result:Negative for intraepithelial Lesion or Malignancy.      Primary Care Provider:  Lehman Prom FNP  CC:  Lipid Management.  History of Present Illness:  Pt into the office for a complete physical exam  PAP - last smear done 1 year ago. 6 children and is married menses - monthly. S/p tubal ligation  Mammogram - no history of mammogram no family history of breast cancer  Optho - Never had an eye exam  Dental - last appt was 1 month ago. she was seen in this office at that time and was referred to the dental clinic  tdap - up to date  Asthma History    Asthma Control Assessment:    Age range: 12+ years    Symptoms: >2 days/week    Nighttime Awakenings: 0-2/month    Interferes w/ normal activity: some limitations    SABA use (not for EIB): 0-2 days/week    Exacerbations requiring oral systemic steroids: 0-1/year    Asthma Control Assessment: Not Well Controlled  Lipid Management History:      Positive NCEP/ATP III risk factors include HDL cholesterol less than 40.  Negative NCEP/ATP III risk factors include female age less than  67 years old and non-tobacco-user status.     Allergies: 1)  ! Ibuprofen 2)  ! Aspirin  Review of Systems General:  Denies fever. Eyes:  Denies blurring. ENT:  Denies earache. CV:  Denies chest pain or discomfort. Resp:  Denies cough and shortness of breath. GI:  Denies abdominal pain, nausea, and vomiting. GU:  Denies discharge. MS:  Denies joint pain. Derm:  Denies lesion(s). Neuro:  Denies headaches. Psych:  Denies anxiety and depression.  Physical Exam  General:  alert.   Head:  normocephalic.   Eyes:  pupils equal, pupils round, and pupils reactive to light.   Ears:  bil TM with bony landmarks present Nose:  no nasal discharge.   Mouth:  pharynx pink and moist and fair dentition.   Neck:  supple.   Chest Wall:  no mass.   Breasts:  no masses and no abnormal thickening.   Lungs:  normal breath sounds.   Heart:  normal rate and regular rhythm.   Abdomen:  soft, non-tender, and normal bowel sounds.   Rectal:  defer Msk:  normal ROM.   Extremities:  no edema Neurologic:  alert & oriented X3.   Skin:  color normal.   Psych:  Oriented X3.    Pelvic Exam  Vulva:      normal appearance.  Urethra and Bladder:      Urethra--no discharge.   Vagina:      physiologic discharge.   Cervix:      parous.   Uterus:      smooth.   Adnexa:      nontender bilaterally.      Impression & Recommendations:  Problem # 1:  ROUTINE GYNECOLOGICAL EXAMINATION (ICD-V72.31) labs done except lipids pap done self breast exam placcard rec optho and dental exam Orders: UA Dipstick w/o Micro (manual) ([address]) KOH/ WET Mount [phone]) Pap Smear, Thin Prep ( Collection of) (J4782) T-Comprehensive Metabolic Panel [phone]) T-CBC w/Diff ([id number]) T-TSH ([id number]) Rapid HIV  (92370) T- GC Chlamydia (27253)  Problem # 2:  ANEMIA (ICD-285.9)  Her updated medication list for this problem includes:    Ferrous Sulfate 325 (65 Fe) Mg Tbec (Ferrous sulfate) ..... One tablet  by mouth three times a day  Problem # 3:  REACTIVE AIRWAY DISEASE (ICD-493.90) pharmacy report indicates that pt is still not taking meds as ordered though symptoms are stable Her updated medication list for this problem includes:    Ventolin Hfa 108 (90 Base) Mcg/act Aers (Albuterol sulfate) .Marland Kitchen... 2 puffs ever 6 hours as needed for shortness of breath    Singulair 10 Mg Tabs (Montelukast sodium) .Marland Kitchen... 1 tablet by mouth at night for asthma    Advair Diskus 250-50 Mcg/dose Aepb (Fluticasone-salmeterol) ..... One inhalation two times a day  *note change in dose**  Complete Medication List: 1)  Ventolin Hfa 108 (90 Base) Mcg/act Aers (Albuterol sulfate) .... 2 puffs ever 6 hours as needed for shortness of breath 2)  Singulair 10 Mg Tabs (Montelukast sodium) .Marland Kitchen.. 1 tablet by mouth at night for asthma 3)  Advair Diskus 250-50 Mcg/dose Aepb (Fluticasone-salmeterol) .... One inhalation two times a day  *note change in dose** 4)  Nexium 40 Mg Cpdr (Esomeprazole magnesium) .... One tablet by mouth daily before breakfast for stomach 5)  Multivitamins Tabs (Multiple vitamin) .Marland Kitchen.. 1 tablet by mouth daily 6)  Ferrous Sulfate 325 (65 Fe) Mg Tbec (Ferrous sulfate) .... One tablet by mouth three times a day 7)  Calcium + D 600-200 Mg-unit Tabs (Calcium carbonate-vitamin d) .... One tablet by mouth two times a day 8)  Nasacort Aq 55 Mcg/act Aers (Triamcinolone acetonide(nasal)) .... One spray in each nostril two times a day **d/c flonase** 9)  Xyzal 5 Mg Tabs (Levocetirizine dihydrochloride) .... One tablet by mouth daily for allergies 10)  Ceron-dm 12.09-23-13 Mg/8ml Syrp (Phenylephrine-chlorphen-dm) .... One teaspoon every 6 hours as needed for congestion  Lipid Assessment/Plan:      Based on NCEP/ATP III, the patient's risk factor category is "0-1 risk factors".  The patient's lipid goals are as follows: Total cholesterol goal is 200; LDL cholesterol goal is 160; HDL cholesterol goal is 40; Triglyceride goal  is 150.     Patient Instructions: 1)  Maria - call dental clinic and schedule an appt for the pt. 2)  she went about 1 month ago and they told her to call and schedule a follow up appt but when she called they were not able to understand her. 3)  Flu vaccine will be here next month.  Come in 4 weeks for a nurse visit to get the  flu vaccine and lipids. you need to get his once per year.  Also do not eat after midnight before this appointment so you can get your cholesterol checked  Laboratory Results   Urine Tests  Date/Time Received: February 17, 2010 3:31 PM   Routine Urinalysis   Color: lt. yellow Appearance: Clear Glucose: negative   (Normal Range: Negative) Bilirubin: negative   (Normal Range: Negative) Ketone: negative   (Normal Range: Negative) Spec. Gravity: <1.005   (Normal Range: 1.003-1.035) Blood: trace-lysed   (Normal Range: Negative) pH: 6.0   (Normal Range: 5.0-8.0) Protein: negative   (Normal Range: Negative) Urobilinogen: 0.2   (Normal Range: 0-1) Nitrite: negative   (Normal Range: Negative) Leukocyte Esterace: negative   (Normal Range: Negative)        Laboratory Results   Urine Tests    Routine Urinalysis   Color: lt. yellow Appearance: Clear Glucose: negative   (Normal Range: Negative) Bilirubin: negative   (Normal Range: Negative) Ketone: negative   (Normal Range: Negative) Spec. Gravity: <1.005   (Normal Range: 1.003-1.035) Blood: trace-lysed   (Normal Range: Negative) pH: 6.0   (Normal Range: 5.0-8.0) Protein: negative   (Normal Range: Negative) Urobilinogen: 0.2   (Normal Range: 0-1) Nitrite: negative   (Normal Range: Negative) Leukocyte Esterace: negative   (Normal Range: Negative)

## 2010-06-22 NOTE — Assessment & Plan Note (Signed)
Summary: Allergic Rhinitis   Vital Signs:  Patient profile:   35 year old female Menstrual status:  regular Weight:      154.1 pounds BMI:     30.71 BSA:     1.66 O2 Sat:      72 % Temp:     97.5 degrees F oral Pulse rate:   100 / minute Pulse rhythm:   regular Resp:     20 per minute BP sitting:   102 / 72  (left arm)  Vitals Entered By: Levon Hedger (Sep 28, 2009 2:35 PM) CC: pt was taking medication for her nose and now she is having infection in her entire face...pt has been using home remedies for throat but right now her ears hurt the most Is Patient Diabetic? No Pain Assessment Patient in pain? yes     Location: ears, nose, chest  Does patient need assistance? Functional Status Self care Ambulation Normal   Primary Care Provider:  Lehman Prom FNP  CC:  pt was taking medication for her nose and now she is having infection in her entire face...pt has been using home remedies for throat but right now her ears hurt the most.  History of Present Illness:  Pt into the office with several complaints +sore throat +nasal congestion +ear pain pt was ordered to get a netti pot following her last visit in this office.  She used the pot twice and she stopped using it due to adverse effects.  Pt reports that she has not been able to afford her medications due to her husband not working at this time so pt has not been able to get her medications  Pt has questions about if the medications are to cure the problem or if it is just to manage the symptoms  Allergies (verified): 1)  ! Ibuprofen  Review of Systems General:  Denies fever. ENT:  Complains of nasal congestion and sore throat. CV:  Denies chest pain or discomfort. Resp:  Denies cough.  Physical Exam  General:  alert.   Head:  normocephalic.   Eyes:  pupils round.   Ears:  bil TM with clear fluid Nose:  nasal congestion (extreme) Mouth:  pale, tonsillar enlargement Lungs:  clear throughout Heart:   normal rate and regular rhythm.   Msk:  up to the exam table Neurologic:  alert & oriented X3.     Impression & Recommendations:  Problem # 1:  ALLERGIC RHINITIS (ICD-477.9) advised pt to make an environemental sweep of her home. there may be triggers where she live claritin samples given to pt in office Her updated medication list for this problem includes:    Nasacort Aq 55 Mcg/act Aers (Triamcinolone acetonide(nasal)) ..... One spray in each nostril two times a day **d/c flonase**  Problem # 2:  REACTIVE AIRWAY DISEASE (ICD-493.90)  Her updated medication list for this problem includes:    Ventolin Hfa 108 (90 Base) Mcg/act Aers (Albuterol sulfate) .Marland Kitchen... 2 puffs ever 6 hours as needed for shortness of breath    Singulair 10 Mg Tabs (Montelukast sodium) .Marland Kitchen... 1 tablet by mouth at night for asthma    Advair Diskus 250-50 Mcg/dose Aepb (Fluticasone-salmeterol) ..... One inhalation two times a day  *note change in dose**  Orders: Pulse Oximetry (single measurment) (94760)  Complete Medication List: 1)  Ventolin Hfa 108 (90 Base) Mcg/act Aers (Albuterol sulfate) .... 2 puffs ever 6 hours as needed for shortness of breath 2)  Singulair 10 Mg Tabs (Montelukast sodium) .Marland KitchenMarland KitchenMarland Kitchen  1 tablet by mouth at night for asthma 3)  Advair Diskus 250-50 Mcg/dose Aepb (Fluticasone-salmeterol) .... One inhalation two times a day  *note change in dose** 4)  Nexium 40 Mg Cpdr (Esomeprazole magnesium) .... One tablet by mouth daily before breakfast for stomach 5)  Allegra-d 12 Hour 60-120 Mg Xr12h-tab (Fexofenadine-pseudoephedrine) .... One tablet by mouth two times a day for allergies 6)  Multivitamins Tabs (Multiple vitamin) .Marland Kitchen.. 1 tablet by mouth daily 7)  Ferrous Sulfate 325 (65 Fe) Mg Tbec (Ferrous sulfate) .... One tablet by mouth three times a day 8)  Calcium + D 600-200 Mg-unit Tabs (Calcium carbonate-vitamin d) .... One tablet by mouth two times a day 9)  Nasacort Aq 55 Mcg/act Aers (Triamcinolone  acetonide(nasal)) .... One spray in each nostril two times a day **d/c flonase** 10)  Fluconazole 150 Mg Tabs (Fluconazole) .... One tablet by mouth x 1 day  Patient Instructions: 1)  Take the claritin 10mg  by mouth daily 2)  You should also use the nasal spray in each nostril 3)  Follow up as needed

## 2010-06-22 NOTE — Assessment & Plan Note (Signed)
Summary: Acute - Sinusitis   Vital Signs:  Patient profile:   35 year old female Menstrual status:  regular LMP:     05/2009 Weight:      162.0 pounds BMI:     32.29 BSA:     1.70 Temp:     98.1 degrees F oral Pulse rate:   71 / minute Pulse rhythm:   regular Resp:     16 per minute BP sitting:   103 / 72  (left arm) Cuff size:   regular  Vitals Entered By: Levon Hedger (June 04, 2009 10:46 AM) CC: sorethroat, cough with fever x 3 days Pain Assessment Patient in pain? yes     Location: stomach  Does patient need assistance? Functional Status Self care Ambulation Normal LMP (date): 05/2009 LMP - Character: normal     Enter LMP: 05/2009 Last PAP Result  Specimen Adequacy: Satisfactory for evaluation.   Interpretation/Result:Negative for intraepithelial Lesion or Malignancy.      CC:  sorethroat and cough with fever x 3 days.  History of Present Illness:  Pt into the office with complaints of headache. Started 1 week ago. Slight cough present Fever for 3 days +nasal congestion Asthma medications continue.  Doing well until last week with Upper respiratory symptoms continue  Pt has been taking tylenol arthritis over the counter for the pain.  Abd discomfort after taking tylenol on yesterday but pt took the meds without eating  Spanish interpreter present today  Allergies: 1)  ! Ibuprofen  Review of Systems General:  Complains of fever; x 3 days. ENT:  Complains of nasal congestion and sore throat; denies earache. CV:  Denies chest pain or discomfort. Resp:  Complains of cough; slight. Neuro:  Complains of headaches.  Physical Exam  General:  alert.   Head:  normocephalic.   Ears:  bil TM with clear fluid  Nose:  nasal congestion frontal sinus tenderness Lungs:  normal breath sounds.   Heart:  normal rate and regular rhythm.   Abdomen:  normal bowel sounds.   Msk:  up to the exam table Neurologic:  alert & oriented X3.   Psych:  Oriented X3.       Impression & Recommendations:  Problem # 1:  SINUSITIS (ICD-473.9)  will start cough meds and antibiotics  The following medications were removed from the medication list:    Mucinex Dm 30-600 Mg Xr12h-tab (Dextromethorphan-guaifenesin) ..... One tablet by mouth two times a day  as needed for cough Her updated medication list for this problem includes:    Allegra-d 12 Hour 60-120 Mg Xr12h-tab (Fexofenadine-pseudoephedrine) ..... One tablet by mouth two times a day for allergies    Ceron-dm 12.09-23-13 Mg/58ml Syrp (Phenylephrine-chlorphen-dm) ..... One teaspoon every 6 hours as needed for cough    Amoxicillin 500 Mg Caps (Amoxicillin) ..... One capsule by mouth three times a day for infection  Orders: Rapid Strep ([address])  Problem # 2:  REACTIVE AIRWAY DISEASE (ICD-493.90) stable for now but advised current respiratory symptoms may trigger asthma Her updated medication list for this problem includes:    Ventolin Hfa 108 (90 Base) Mcg/act Aers (Albuterol sulfate) .Marland Kitchen... 2 puffs ever 6 hours as needed for shortness of breath    Singulair 10 Mg Tabs (Montelukast sodium) .Marland Kitchen... 1 tablet by mouth at night for asthma    Advair Diskus 100-50 Mcg/dose Misc (Fluticasone-salmeterol) .Marland Kitchen... 1 inhalation twice daily for breathing  Problem # 3:  ANEMIA (ICD-285.9) advised pt to keep taking iron but needs to  also add fiber in the diet to prevent constipation. may need to take a stool softner Her updated medication list for this problem includes:    Ferrous Sulfate 325 (65 Fe) Mg Tbec (Ferrous sulfate) ..... One tablet by mouth three times a day  Complete Medication List: 1)  Ibuprofen 600 Mg Tabs (Ibuprofen) .... One tablet by mouth two times a day as needed for pain 2)  Ventolin Hfa 108 (90 Base) Mcg/act Aers (Albuterol sulfate) .... 2 puffs ever 6 hours as needed for shortness of breath 3)  Singulair 10 Mg Tabs (Montelukast sodium) .Marland Kitchen.. 1 tablet by mouth at night for asthma 4)  Advair Diskus  100-50 Mcg/dose Misc (Fluticasone-salmeterol) .Marland Kitchen.. 1 inhalation twice daily for breathing 5)  Nexium 40 Mg Cpdr (Esomeprazole magnesium) .... One tablet by mouth daily before breakfast for stomach 6)  Allegra-d 12 Hour 60-120 Mg Xr12h-tab (Fexofenadine-pseudoephedrine) .... One tablet by mouth two times a day for allergies 7)  Multivitamins Tabs (Multiple vitamin) .Marland Kitchen.. 1 tablet by mouth daily 8)  Fluticasone Propionate 0.05 % Crea (Fluticasone propionate) .... One spray in each nostril two times a day 9)  Ferrous Sulfate 325 (65 Fe) Mg Tbec (Ferrous sulfate) .... One tablet by mouth three times a day 10)  Calcium + D 600-200 Mg-unit Tabs (Calcium carbonate-vitamin d) .... One tablet by mouth two times a day 11)  Ceron-dm 12.09-23-13 Mg/27ml Syrp (Phenylephrine-chlorphen-dm) .... One teaspoon every 6 hours as needed for cough 12)  Amoxicillin 500 Mg Caps (Amoxicillin) .... One capsule by mouth three times a day for infection  Patient Instructions: 1)  Start allegra D - 1 tablet by mouth two times a day (Prescription sent to [facility]) Stop the lorantadine when you get the new medications. 2)  Continue your asthma medications Prescriptions: AMOXICILLIN 500 MG CAPS (AMOXICILLIN) One capsule by mouth three times a day for infection  #21 x 0   Entered and Authorized by:   Lehman Prom FNP   Signed by:   Lehman Prom FNP on 06/04/2009   Method used:   Print then Give to Patient   RxID:   [id number] CERON-DM 12.09-23-13 MG/5ML SYRP (PHENYLEPHRINE-CHLORPHEN-DM) One teaspoon every 6 hours as needed for cough  #4 ounces x 0   Entered and Authorized by:   Lehman Prom FNP   Signed by:   Lehman Prom FNP on 06/04/2009   Method used:   Print then Give to Patient   RxID:   [id number] ALLEGRA-D 12 HOUR 60-120 MG XR12H-TAB (FEXOFENADINE-PSEUDOEPHEDRINE) One tablet by mouth two times a day for allergies  #60 x 5   Entered and Authorized by:   Lehman Prom FNP   Signed by:   Lehman Prom FNP on 06/04/2009   Method used:   Faxed to ...       [facility] (retail)       [address] [place], [place]  [address]       Ph: [phone]       Fax: [phone]   RxID:   [id number]   Laboratory Results  Date/Time Received: June 04, 2009 11:10 AM  Date/Time Reported: June 04, 2009 11:10 AM   Other Tests  Rapid Strep: negative

## 2010-06-22 NOTE — Assessment & Plan Note (Signed)
Summary: Acute - Reactive Airway    Vital Signs:  Patient profile:   35 year old female Menstrual status:  regular Weight:      155.5 pounds BMI:     30.99 BSA:     1.67 O2 Sat:      98 % on Room air Temp:     98.3 degrees F oral Pulse rate:   80 / minute Pulse rhythm:   regular Resp:     20 per minute BP sitting:   103 / 71  (left arm) Cuff size:   regular  Vitals Entered By: Levon Hedger (July 06, 2009 2:54 PM)  O2 Flow:  Room air  History of Present Illness:  Pt into the office to f/u on visit 3 days ago. Cough has improved. Main complaint at this time is sore throat and headache. Reports headache has been constant over the past 2 weeks and was even present during last visit. She presents with her medications and has been taking them as ordered. Pt did not get the tessalon perles for cough over the weekend due to cost.  Needs spanish interpreter     Headache HPI:      The headaches are not associated with an aura.  The location of the headaches are bilateral and occipital.  Headache quality is pressure or tightness.  Aggravating factors include physical activity, head movement, and coughing or sneezing.        The patient denies fever, seizures, and impaired level of consciousness.        Additional history: Headache present x 2 weeks.  started initailly in frontal region and has progressed into the occipital. Denies any nausea or fever. Tylenol does not help much with the headache and pt is not able to take ibuprofen due to asthma trigger.    Headache Treatment History:      She has tried acetaminophen-plain which was ineffective.     Allergies: 1)  ! Ibuprofen  Review of Systems General:  Denies fever. Eyes:  Denies blurring. ENT:  Denies earache. CV:  Complains of chest pain or discomfort. Resp:  Denies cough. GI:  Denies abdominal pain, nausea, and vomiting. Neuro:  Complains of headaches.  Physical Exam  General:  alert.   Head:   normocephalic.   Eyes:  pupils round.   Ears:  bil TM with bony landmarks present right ear with slight erythema Nose:  frontal sinus tenderness Mouth:  pharynx pink and moist.   Neck:  active ROM Lungs:  normal breath sounds.   no wheezes Heart:  normal rate and regular rhythm.   Msk:  up to the exam table Neurologic:  alert & oriented X3.   Skin:  color normal.   Psych:  Oriented X3.     Impression & Recommendations:  Problem # 1:  REACTIVE AIRWAY DISEASE (ICD-493.90)  Will increase advair to 250/50 on next visit improved today Her updated medication list for this problem includes:    Ventolin Hfa 108 (90 Base) Mcg/act Aers (Albuterol sulfate) .Marland Kitchen... 2 puffs ever 6 hours as needed for shortness of breath    Singulair 10 Mg Tabs (Montelukast sodium) .Marland Kitchen... 1 tablet by mouth at night for asthma    Advair Diskus 250-50 Mcg/dose Aepb (Fluticasone-salmeterol) ..... One inhalation two times a day  *note change in dose**  Orders: Pulse Oximetry (single measurment) (16109)  Problem # 2:  ALLERGIC RHINITIS (ICD-477.9) will treat for sinusitis given headache  continue other current medications  Complete Medication List:  1)  Ventolin Hfa 108 (90 Base) Mcg/act Aers (Albuterol sulfate) .... 2 puffs ever 6 hours as needed for shortness of breath 2)  Singulair 10 Mg Tabs (Montelukast sodium) .Marland Kitchen.. 1 tablet by mouth at night for asthma 3)  Advair Diskus 250-50 Mcg/dose Aepb (Fluticasone-salmeterol) .... One inhalation two times a day  *note change in dose** 4)  Nexium 40 Mg Cpdr (Esomeprazole magnesium) .... One tablet by mouth daily before breakfast for stomach 5)  Allegra-d 12 Hour 60-120 Mg Xr12h-tab (Fexofenadine-pseudoephedrine) .... One tablet by mouth two times a day for allergies 6)  Multivitamins Tabs (Multiple vitamin) .Marland Kitchen.. 1 tablet by mouth daily 7)  Fluticasone Propionate 0.05 % Crea (Fluticasone propionate) .... One spray in each nostril two times a day 8)  Ferrous Sulfate 325  (65 Fe) Mg Tbec (Ferrous sulfate) .... One tablet by mouth three times a day 9)  Calcium + D 600-200 Mg-unit Tabs (Calcium carbonate-vitamin d) .... One tablet by mouth two times a day 10)  Tessalon Perles 100 Mg Caps (Benzonatate) .... One capsule by mouth two times a day as needed for cough 11)  Clarithromycin 500 Mg Tabs (Clarithromycin) .... One tablet by mouth two times a day for infection  Patient Instructions: 1)  Follow up in 2 weeks for breathing and assessment of headache.  2)  If headache still present after antibiotics will need to schedule a head CT 3)  Will also consider home nebulizer if you continue with frequent exacerbation Prescriptions: CLARITHROMYCIN 500 MG TABS (CLARITHROMYCIN) One tablet by mouth two times a day for infection  #20 x 0   Entered and Authorized by:   Lehman Prom FNP   Signed by:   Lehman Prom FNP on 07/06/2009   Method used:   Faxed to ...       [facility] (retail)       [address] [place], [place]  [address]       Ph: [phone]       Fax: [phone]   RxID:   [id number]

## 2010-06-22 NOTE — Letter (Signed)
Summary: Handout Printed  Printed Handout:  - Seasonal (Allergic) Rhinitis

## 2010-06-22 NOTE — Assessment & Plan Note (Signed)
Summary: Asthma/Headache   Vital Signs:  Patient profile:   35 year old female Menstrual status:  regular LMP:     06/27/2009 Weight:      156.7 pounds BMI:     31.23 BSA:     1.67 Temp:     98.1 degrees F oral Pulse rate:   101 / minute Pulse rhythm:   regular Resp:     99 per minute BP sitting:   114 / 74  (left arm) Cuff size:   regular  Vitals Entered By: Levon Hedger (July 20, 2009 3:08 PM) CC: follow-up visit on breathing, Headache Is Patient Diabetic? No Pain Assessment Patient in pain? yes       Does patient need assistance? Functional Status Self care Ambulation Normal LMP (date): 06/27/2009 LMP - Character: normal     Enter LMP: 06/27/2009 Last PAP Result  Specimen Adequacy: Satisfactory for evaluation.   Interpretation/Result:Negative for intraepithelial Lesion or Malignancy.      CC:  follow-up visit on breathing and Headache.  History of Present Illness:  Pt into the office for asthma follow up - asthma and headache Pt presents today with all her medications for asthma Still with some chest discomfort with deep breathing. Denies any cough no fever no wheezing Pt has concerns that "something is wrong with my chest" Pt just finished the antibiotic as ordered during the last visit.  She did not start as scheduled due to finances.     Asthma History    Asthma Control Assessment:    Age range: 12+ years    Symptoms: 0-2 days/week    Nighttime Awakenings: 0-2/month    Interferes w/ normal activity: no limitations    SABA use (not for EIB): 0-2 days/week    Asthma Control Assessment: Well Controlled  Headache HPI:      The headaches are not associated with an aura.  The location of the headaches are occipital.  Headache quality is pressure or tightness.  Aggravating factors include physical activity, head movement, and coughing or sneezing.              Allergies (verified): 1)  ! Ibuprofen  Review of Systems Eyes:  Complains of  blurring. CV:  Denies chest pain or discomfort. Resp:  Complains of chest discomfort; denies chest pain with inspiration, cough, and shortness of breath. Neuro:  Complains of headaches.  Physical Exam  General:  alert and well-developed.   Head:  normocephalic.   Eyes:  pupils round.   Chest Wall:  tenderness with palpation Lungs:  normal breath sounds.   no wheezes or rhonchi noted Heart:  normal rate and regular rhythm.   Msk:  up to the exam  Neurologic:  alert & oriented X3.   Skin:  color normal.   Psych:  Oriented X3.     Impression & Recommendations:  Problem # 1:  REACTIVE AIRWAY DISEASE (ICD-493.90) advised pt to continue current meds will order CXRAY given pt's concerns. if negative then problem is most likely muscle aleve samples given in office - will be careful as to not induce GI symptoms Her updated medication list for this problem includes:    Ventolin Hfa 108 (90 Base) Mcg/act Aers (Albuterol sulfate) .Marland Kitchen... 2 puffs ever 6 hours as needed for shortness of breath    Singulair 10 Mg Tabs (Montelukast sodium) .Marland Kitchen... 1 tablet by mouth at night for asthma    Advair Diskus 250-50 Mcg/dose Aepb (Fluticasone-salmeterol) ..... One inhalation two times a day  *note  change in dose**  Orders: CXR- 2view (CXR)  Problem # 2:  HEADACHE (ICD-784.0)  will order head CT pt also needs an eye exam she can scheduled an appt for [facility] at [address] Orders: CT without Contrast (CT w/o contrast)  Her updated medication list for this problem includes:    Aleve 220 Mg Tabs (Naproxen sodium) .Marland Kitchen... 2 tablets by mouth daily  Problem # 3:  FAMILY PLANNING (ICD-V25.09)  Pt would like tubal ligation she is aware of the cost  Complete Medication List: 1)  Ventolin Hfa 108 (90 Base) Mcg/act Aers (Albuterol sulfate) .... 2 puffs ever 6 hours as needed for shortness of breath 2)  Singulair 10 Mg Tabs (Montelukast sodium) .Marland Kitchen.. 1 tablet by mouth at night for asthma 3)  Advair  Diskus 250-50 Mcg/dose Aepb (Fluticasone-salmeterol) .... One inhalation two times a day  *note change in dose** 4)  Nexium 40 Mg Cpdr (Esomeprazole magnesium) .... One tablet by mouth daily before breakfast for stomach 5)  Allegra-d 12 Hour 60-120 Mg Xr12h-tab (Fexofenadine-pseudoephedrine) .... One tablet by mouth two times a day for allergies 6)  Multivitamins Tabs (Multiple vitamin) .Marland Kitchen.. 1 tablet by mouth daily 7)  Fluticasone Propionate 0.05 % Crea (Fluticasone propionate) .... One spray in each nostril two times a day 8)  Ferrous Sulfate 325 (65 Fe) Mg Tbec (Ferrous sulfate) .... One tablet by mouth three times a day 9)  Calcium + D 600-200 Mg-unit Tabs (Calcium carbonate-vitamin d) .... One tablet by mouth two times a day 10)  Aleve 220 Mg Tabs (Naproxen sodium) .... 2 tablets by mouth daily  Other Orders: Gynecologic Referral (Gyn)  Patient Instructions: 1)  Asthma - continue current medications 2)  Get x-ray of your chest to look at your chest.  If this is negative then pain is likely from muscles that are sore from cough or deep breathing 3)  Headache - you should schedule an appointment for an eye exam at [facility].   This will look for any cataracts, glaucoma in the eyes.  You will likely need an eye exam.  The cheapest place to get this done is [facility] 4)  Keep your appointment for head CT 5)  Follow up in this office in 2 weeks for head CT and CXRAY results Prescriptions: ALEVE 220 MG TABS (NAPROXEN SODIUM) 2 tablets by mouth daily  #10 x 0   Entered and Authorized by:   Lehman Prom FNP   Signed by:   Lehman Prom FNP on 07/23/2009   Method used:   Samples Given   RxID:   [id number]

## 2010-06-22 NOTE — Progress Notes (Signed)
Summary: retasure results  Phone Note Call from Patient Call back at Home Phone [phone]   Summary of Call: Pt wants to know if the provider can get the result from retasure exam done at the other location. [facility]  Initial call taken by: Manon Hilding,  August 10, 2009 10:30 AM  Follow-up for Phone Call        forward to N. Daphine Deutscher, FNP Follow-up by: Levon Hedger,  August 10, 2009 4:47 PM  Additional Follow-up for Phone Call Additional follow up Details #1::        I don't have the results as of yet. When was the exam done?  It takes at least 2 weeks for the test to be viewed by the eye doctor and the report to migrate over to this office from [facility].  Additional Follow-up by: Lehman Prom FNP,  August 13, 2009 8:15 AM    Additional Follow-up for Phone Call Additional follow up Details #2::    called medical records and they will fax results to the office Follow-up by: Levon Hedger,  August 19, 2009 4:19 PM  Additional Follow-up for Phone Call Additional follow up Details #3:: Details for Additional Follow-up Action Taken: notify pt that retasure results are ok -no cataracts, glaucoma or problems with the vessels in her eyes. This screening did NOT check her need for glasses.  She still may need a basic eye exam if she is having blurriness or trouble focusing when trying to read.  most economical place is [email or web address]  August 20, 2009  1:07 PM  Levon Hedger  August 21, 2009 12:17 PM Per Ashby Dawes left message on machine for pt to return call to the office.  pt informed of above medication per Ashby Dawes. Levon Hedger  August 25, 2009 4:46 PM   Levon Hedger  August 25, 2009 3:23 PM Left message on machine for pt to retur call to the office per Graciela.     Diabetes Management Exam:    Eye Exam:       Eye Exam done elsewhere          Date: 07/27/2009          Results: normal          Done by: Colonel Bald

## 2010-06-22 NOTE — Progress Notes (Signed)
Summary: CXRAY/head CT results  Phone Note Outgoing Call   Summary of Call: notify pt that chest x-ray is normal head ct shows that she has a sinus infection.  this is likely the cause of her headache. will send a prescription for antibiotics to [facility] pharmacy as it will be expensive from [facility] she will need to take according to the directions - Amoxil 875mg  by mouth two times a day x 10 days (rx printed and in basket - fax to [facility] pharmacy) Initial call taken by: Lehman Prom FNP,  July 23, 2009 8:00 PM  Follow-up for Phone Call        per Graciela left message on machine for pt to return call to the office. Levon Hedger  August 03, 2009 4:27 PM  Levon Hedger  August 03, 2009 4:48 PM Per Ashby Dawes pt informed.  will call [place] pharmacy for availability of meds.    New/Updated Medications: LEVAQUIN 750 MG TABS (LEVOFLOXACIN) One tablet by mouth daily for infection AMOXICILLIN-POT CLAVULANATE 875-125 MG TABS (AMOXICILLIN-POT CLAVULANATE) One tablet by mouth two times a day for infection Prescriptions: AMOXICILLIN-POT CLAVULANATE 875-125 MG TABS (AMOXICILLIN-POT CLAVULANATE) One tablet by mouth two times a day for infection  #20 x 0   Entered and Authorized by:   Lehman Prom FNP   Signed by:   Lehman Prom FNP on 07/24/2009   Method used:   Printed then faxed to ...       [facility] - Pharmac (retail)       [address] [place], [place]  [address]       Ph: [phone]       Fax: [phone]   RxID:   [id number] LEVAQUIN 750 MG TABS (LEVOFLOXACIN) One tablet by mouth daily for infection  #5 x 0   Entered and Authorized by:   Lehman Prom FNP   Signed by:   Lehman Prom FNP on 07/23/2009   Method used:   Faxed to ...       [facility] - Pharmac (retail)       [address] [place], [place]  [address]       Ph: [phone]       Fax: [phone]   RxID:   [id number]

## 2010-06-22 NOTE — Letter (Signed)
Summary: [facility]  [facility]   Imported By: Arta Bruce 08/21/2009 10:30:41  _____________________________________________________________________  External Attachment:    Type:   Image     Comment:   External Document

## 2010-06-22 NOTE — Assessment & Plan Note (Signed)
Summary: S/p ER visit   Vital Signs:  Patient profile:   35 year old female Menstrual status:  regular Weight:      154.4 pounds BMI:     30.77 Temp:     98.0 degrees F oral Pulse rate:   73 / minute Pulse rhythm:   regular Resp:     16 per minute BP sitting:   112 / 74  (left arm) Cuff size:   regular  Vitals Entered By: Levon Hedger (October 28, 2009 8:30 AM) CC: x 2 weeks tooth pain on the right side of her face...went to urgent care for taking Ibuprofen which she has an allergy  Is Patient Diabetic? No Pain Assessment Patient in pain? yes     Location: chest  Does patient need assistance? Functional Status Self care Ambulation Normal   Primary Care Provider:  Lehman Prom FNP  CC:  x 2 weeks tooth pain on the right side of her face...went to urgent care for taking Ibuprofen which she has an allergy .  History of Present Illness:  Pt into the office because she went to the ER on 2 days ago after taking a advil for a headache. **Pt has a known allergy to NSAIDs but reports she was in so much pain she took it anyway** This medications induced an asthmatic response and she was sent to the ER. she did get Rx filled as ordered including famotidine 20mg  two times a day x 3 days and loratadine 10mg  by mouth daily.  She has been having tooth pain for the past 2 weeks. She notes that she has a cavity in one of her teeth No dental care in many years +headache +facial swelling +pain  Allergies (verified): 1)  ! Ibuprofen  Review of Systems General:  mouth pain. ENT:  Complains of nasal congestion. CV:  Denies chest pain or discomfort. Resp:  Denies cough and shortness of breath; SOB has resolved at this time. GI:  Denies abdominal pain, nausea, and vomiting. Neuro:  Complains of headaches.  Physical Exam  General:  alert.   Head:  normocephalic.   Nose:  right nare - inflammation tender with palpation of right maxillary slight swelling Mouth:  right upper  molar - cavity    Impression & Recommendations:  Problem # 1:  ABSCESS, TOOTH (ICD-522.5) will treat with pen v k will refer pt to dental clinic Orders: Dental Referral (Dentist)  Problem # 2:  REACTIVE AIRWAY DISEASE (ICD-493.90)  Her updated medication list for this problem includes:    Ventolin Hfa 108 (90 Base) Mcg/act Aers (Albuterol sulfate) .Marland Kitchen... 2 puffs ever 6 hours as needed for shortness of breath    Singulair 10 Mg Tabs (Montelukast sodium) .Marland Kitchen... 1 tablet by mouth at night for asthma    Advair Diskus 250-50 Mcg/dose Aepb (Fluticasone-salmeterol) ..... One inhalation two times a day  *note change in dose**  Complete Medication List: 1)  Ventolin Hfa 108 (90 Base) Mcg/act Aers (Albuterol sulfate) .... 2 puffs ever 6 hours as needed for shortness of breath 2)  Singulair 10 Mg Tabs (Montelukast sodium) .Marland Kitchen.. 1 tablet by mouth at night for asthma 3)  Advair Diskus 250-50 Mcg/dose Aepb (Fluticasone-salmeterol) .... One inhalation two times a day  *note change in dose** 4)  Nexium 40 Mg Cpdr (Esomeprazole magnesium) .... One tablet by mouth daily before breakfast for stomach 5)  Allegra-d 12 Hour 60-120 Mg Xr12h-tab (Fexofenadine-pseudoephedrine) .... One tablet by mouth two times a day for allergies 6)  Multivitamins Tabs (Multiple vitamin) .Marland Kitchen.. 1 tablet by mouth daily 7)  Ferrous Sulfate 325 (65 Fe) Mg Tbec (Ferrous sulfate) .... One tablet by mouth three times a day 8)  Calcium + D 600-200 Mg-unit Tabs (Calcium carbonate-vitamin d) .... One tablet by mouth two times a day 9)  Nasacort Aq 55 Mcg/act Aers (Triamcinolone acetonide(nasal)) .... One spray in each nostril two times a day **d/c flonase** 10)  Penicillin V Potassium 500 Mg Tabs (Penicillin v potassium) .... One tablet by mouth three times a day for infection  Patient Instructions: 1)  You will be referred to the dental clinic 2)  they will contact you with the time/date of the appointment. 3)  Follow up as  needed Prescriptions: PENICILLIN V POTASSIUM 500 MG TABS (PENICILLIN V POTASSIUM) One tablet by mouth three times a day for infection  #30 x 0   Entered and Authorized by:   Lehman Prom FNP   Signed by:   Lehman Prom FNP on 10/28/2009   Method used:   Print then Give to Patient   RxID:   [id number] ALLEGRA-D 12 HOUR 60-120 MG XR12H-TAB (FEXOFENADINE-PSEUDOEPHEDRINE) One tablet by mouth two times a day for allergies  #60 x 5   Entered and Authorized by:   Lehman Prom FNP   Signed by:   Lehman Prom FNP on 10/28/2009   Method used:   Faxed to ...       [facility] - Pharmac (retail)       [address] [place], [place]  [address]       Ph: [phone]       Fax: [phone]   RxID:   [id number] SINGULAIR 10 MG TABS (MONTELUKAST SODIUM) 1 tablet by mouth at night for asthma  #1 x 6   Entered and Authorized by:   Lehman Prom FNP   Signed by:   Lehman Prom FNP on 10/28/2009   Method used:   Faxed to ...       [facility] - Pharmac (retail)       [address] [place], [place]  [address]       Ph: [phone]       Fax: [phone]   RxID:   [id number]   Appended Document: S/p ER visit    Clinical Lists Changes  Medications: Removed medication of ALLEGRA-D 12 HOUR 60-120 MG XR12H-TAB (FEXOFENADINE-PSEUDOEPHEDRINE) One tablet by mouth two times a day for allergies Added new medication of XYZAL 5 MG TABS (LEVOCETIRIZINE DIHYDROCHLORIDE) One tablet by mouth daily for allergies - Signed Rx of XYZAL 5 MG TABS (LEVOCETIRIZINE DIHYDROCHLORIDE) One tablet by mouth daily for allergies;  #30 x 5;  Signed;  Entered by: Lehman Prom FNP;  Authorized by: Lehman Prom FNP;  Method used: Faxed to [facility], [address], [place], [place]  [address], Ph: [phone], Fax: [phone]    Prescriptions: XYZAL 5 MG TABS (LEVOCETIRIZINE  DIHYDROCHLORIDE) One tablet by mouth daily for allergies  #30 x 5   Entered and Authorized by:   Lehman Prom FNP   Signed by:   Lehman Prom FNP on 10/28/2009   Method used:   Faxed to ...       [facility] - Pharmac (retail)       [address] [place], [place]  [address]  Ph: [phone]       Fax: [phone]   RxID:   [id number]

## 2010-06-22 NOTE — Letter (Signed)
Summary: TEST ORDER FORM//CT//APPT DATE & TIME  TEST ORDER FORM//CT//APPT DATE & TIME   Imported By: Arta Bruce 09/16/2009 15:09:55  _____________________________________________________________________  External Attachment:    Type:   Image     Comment:   External Document

## 2010-06-22 NOTE — Assessment & Plan Note (Signed)
Summary: Acute- Asthma Flare   Vital Signs:  Patient profile:   35 year old female Menstrual status:  regular Weight:      158.3 pounds O2 Sat:      96 % on Room air Temp:     98.3 degrees F oral Pulse rate:   79 / minute Pulse rhythm:   regular Resp:     18 per minute BP sitting:   102 / 69  (left arm) Cuff size:   regular  Vitals Entered By: Geanie Cooley  (July 03, 2009 11:48 AM)  O2 Flow:  Room air  Serial Vital Signs/Assessments:  Comments: 11:56 AM PEAK FLOWS 1: 350 2: 360 3:350 By: Vesta Mixer CMA   CC: Pt states she has been having problems with her asthma. She hasnt had any asthma attacks, but pt states her chest hurt, her throat hurts, she has been coughing and she has had cold chills and headaches for 5days. After each blow of the peak flow, pt starting couging alot. Is Patient Diabetic? No Pain Assessment Patient in pain? yes     Location: chest Intensity: 7 Type: heaviness Onset of pain  5days  Does patient need assistance? Functional Status Self care Ambulation Normal   CC:  Pt states she has been having problems with her asthma. She hasnt had any asthma attacks, but pt states her chest hurt, her throat hurts, she has been coughing and she has had cold chills and headaches for 5days. After each blow of the peak flow, and pt starting couging alot.Marland Kitchen  History of Present Illness:  Pt into the office with c/o SOB and chest pain. Pt has been taking meds as ordered except Allegra D which she reports she only took 1 dose and it gave her chest pain  Asthma History    Initial Asthma Severity Rating:    Age range: 12+ years    Symptoms: daily    Nighttime Awakenings: 3-4/month    Interferes w/ normal activity: some limitations    SABA use (not for EIB): >2 days/week but not >1X/day    Exacerbations requiring oral systemic steroids: 2 or more/year    Asthma Severity Assessment: Moderate Persistent    Allergies: 1)  ! Ibuprofen  Review of  Systems CV:  Complains of chest pain or discomfort. Resp:  Complains of chest pain with inspiration, cough, shortness of breath, and wheezing. GI:  Denies nausea and vomiting.  Physical Exam  General:  alert.   Head:  normocephalic.   Lungs:  few scattered wheezes decreased air movement Heart:  normal rate and regular rhythm.     Impression & Recommendations:  Problem # 1:  REACTIVE AIRWAY DISEASE (ICD-493.90) poor control will increase advair to 250/50 neb given in office along with depomedrol advised pt to restart allegra at 1/2 tablet x 3 days then advance to whole table pt will need to search home for triggers follow up in 3 days Her updated medication list for this problem includes:    Ventolin Hfa 108 (90 Base) Mcg/act Aers (Albuterol sulfate) .Marland Kitchen... 2 puffs ever 6 hours as needed for shortness of breath    Singulair 10 Mg Tabs (Montelukast sodium) .Marland Kitchen... 1 tablet by mouth at night for asthma    Advair Diskus 250-50 Mcg/dose Aepb (Fluticasone-salmeterol) ..... One inhalation two times a day  *note change in dose**  Orders: Depo- Medrol 80mg  (J1040) Admin of Therapeutic Inj  intramuscular or subcutaneous (36644) Albuterol Sulfate Sol 1mg  unit dose (I3474) Atrovent 1mg  (Neb) (  E4540) Nebulizer Tx (98119)  Complete Medication List: 1)  Ibuprofen 600 Mg Tabs (Ibuprofen) .... One tablet by mouth two times a day as needed for pain 2)  Ventolin Hfa 108 (90 Base) Mcg/act Aers (Albuterol sulfate) .... 2 puffs ever 6 hours as needed for shortness of breath 3)  Singulair 10 Mg Tabs (Montelukast sodium) .Marland Kitchen.. 1 tablet by mouth at night for asthma 4)  Advair Diskus 250-50 Mcg/dose Aepb (Fluticasone-salmeterol) .... One inhalation two times a day  *note change in dose** 5)  Nexium 40 Mg Cpdr (Esomeprazole magnesium) .... One tablet by mouth daily before breakfast for stomach 6)  Allegra-d 12 Hour 60-120 Mg Xr12h-tab (Fexofenadine-pseudoephedrine) .... One tablet by mouth two times a day  for allergies 7)  Multivitamins Tabs (Multiple vitamin) .Marland Kitchen.. 1 tablet by mouth daily 8)  Fluticasone Propionate 0.05 % Crea (Fluticasone propionate) .... One spray in each nostril two times a day 9)  Ferrous Sulfate 325 (65 Fe) Mg Tbec (Ferrous sulfate) .... One tablet by mouth three times a day 10)  Calcium + D 600-200 Mg-unit Tabs (Calcium carbonate-vitamin d) .... One tablet by mouth two times a day 11)  Tessalon Perles 100 Mg Caps (Benzonatate) .... One capsule by mouth two times a day as needed for cough  Patient Instructions: 1)  You have been given an injection of medication which will help open your lungs over the weekend. 2)  Your advair will be increased to 250/50.  A new prescription has been sent to the pharmacy for you to pick up next time. 3)  Allegra D - Take 1/2 tablet on Friday, Saturday and Sunday. 4)  Starting on Monday, take the whole tablet 5)  Follow up on Monday with n.martin for recheck of lungs Prescriptions: CALCIUM + D 600-200 MG-UNIT TABS (CALCIUM CARBONATE-VITAMIN D) One tablet by mouth two times a day  #60 x 5   Entered and Authorized by:   Hanan Mcwilliams Martin FNP   Signed by:   Sabriah Hobbins Martin FNP on 07/05/2009   Method used:   Faxed to ...       [facility] - Pharmac (retail)       [address]       [place], [place]  [address]       Ph: [phone]       Fax: [phone]   RxID:   [id number] CALCIUM + D 600-200 MG-UNIT TABS (CALCIUM CARBONATE-VITAMIN D) One tablet by mouth two times a day  #60 x 5   Entered and Authorized by:   Kadi Hession Martin FNP   Signed by:   Javarri Segal Martin FNP on 07/05/2009   Method used:   Printed then faxed to ...       [facility] - Pharmac (retail)       [address] [place], [place]  [address]       Ph: [phone]       Fax: [phone]   RxID:   [id number] TESSALON PERLES 100 MG CAPS (BENZONATATE) One capsule by mouth two times a day as needed  for cough  #14 x 0   Entered and Authorized by:   Lehman Prom FNP   Signed by:   Lehman Prom FNP on 07/03/2009   Method used:   Print then Give to Patient   RxID:   [id number] ADVAIR DISKUS 250-50 MCG/DOSE AEPB (FLUTICASONE-SALMETEROL) One inhalation two times a day  *Note change in dose**  #1 x  3   Entered and Authorized by:   Lehman Prom FNP   Signed by:   Lehman Prom FNP on 07/03/2009   Method used:   Faxed to ...       [facility] - Pharmac (retail)       [address] [place], [place]  [address]       Ph: [phone]       Fax: [phone]   RxID:   [id number]      Medication Administration  Injection # 1:    Medication: Depo- Medrol 80mg     Diagnosis: VIRAL URI (ICD-465.9)    Route: IM    Site: RUOQ gluteus    Exp Date: 01/2011    Lot #: [id number]    Mfr: Pharmacia    Patient tolerated injection without complications    Given by: Levon Hedger (July 03, 2009 2:04 PM)  Medication # 1:    Medication: Albuterol Sulfate Sol 1mg  unit dose    Diagnosis: VIRAL URI (ICD-465.9)    Dose: 2.5mg     Route: po    Exp Date: 12/2010    Lot #: [id number]    Mfr: nephron    Patient tolerated medication without complications    Given by: Levon Hedger (July 03, 2009 12:43 PM)  Medication # 2:    Medication: Atrovent 1mg  (Neb)    Diagnosis: VIRAL URI (ICD-465.9)    Dose: 0.5mg     Route: po    Exp Date: 12/2010    Lot #: [id number]    Mfr: nephron    Patient tolerated medication without complications    Given by: Levon Hedger (July 03, 2009 12:43 PM)  Orders Added: 1)  Depo- Medrol 80mg  [J1040] 2)  Admin of Therapeutic Inj  intramuscular or subcutaneous [96372] 3)  Albuterol Sulfate Sol 1mg  unit dose [J7613] 4)  Atrovent 1mg  (Neb) [U4403] 5)  Nebulizer Tx [94640] 6)  Est. Patient Level III [47425]

## 2010-06-22 NOTE — Progress Notes (Signed)
Summary: urgent message!!!!  Phone Note Call from Patient Call back at [facility] Phone [phone]   Summary of Call: Since yesterday, the pt start using allegra again but she has cough now  and she is wondering if she use any cough medication  can affect her using the allegra (asthma patient) Daphine Deutscher FNP Initial call taken by: Manon Hilding,  July 01, 2009 9:58 AM  Follow-up for Phone Call        Pt called back again because she is unsure of what medication she should take for cough because the pt is asthmatic and recently start using allegra again.  Pt is wondering if someone can advice of what medication she should use that don't affect her health condition.. Also today she has chest pain and sorethroat.Manon Hilding  July 02, 2009 12:21 PM  Levon Hedger  July 02, 2009 12:38 PM Per Ashby Dawes pt state since last night her chest is very tight and hard for her to breath. She has a sorethroat and coug with green phlem x 3 days and she said she had some fever last night.  She is eating a lttle and is drinking juices and water.  She says she has only used advil and singular.  Additional Follow-up for Phone Call Additional follow up Details #1::        Pt is a very poorly controlled asthmatic Pt should have an inhaler to use for SOB. She should also be using advair by mouth two times a day EVERYDAY.  continue singulair and allegra. gargle with warm salt water. Have her come this afternoon for nurse triage - o2 sat, peak flow, and possibly nebulizer Additional Follow-up by: Lehman Prom FNP,  July 02, 2009 12:57 PM    Additional Follow-up for Phone Call Additional follow up Details #2::    I spoke with the pt and she only is taking singular and advair and she is not using allegra because she start having chest pain.Manon Hilding  July 02, 2009 1:46 PM   Schedule pt for a nurse triage visit this afternoon as indicated in the previous note. she needs to bring all  her medication (if she is taking them or not) into the office) n.martin,fnp  July 02, 2009  1:54 PM  I called back the pt again, and she will come for an office visit tomorrow (Jul 03, 2009 at 11:45 am).Marland Kitchenand I also scheduled an interpreter for the visit. Manon Hilding  July 02, 2009 2:26 PM

## 2010-06-22 NOTE — Progress Notes (Signed)
Summary: appt request  Phone Note Call from Patient Call back at [facility] Phone [phone]   Summary of Call: Since last two days, the pt has nasal congestion, and nasal congestion and she is wondering if she can use tylenol.  She is an asthmatic pt so she do not know what to use.  I set up an office visit with [facility] on Thursday, Jan 13. [facility] FNP Initial call taken by: Manon Hilding,  May 29, 2009 3:41 PM  Follow-up for Phone Call        Pt still having nasal congestion, cough and headache and she is asthmatic and as result would like to somebody call her back that can advise her what she can get from the pharmacy.Manon Hilding  June 01, 2009 11:57 AM  Follow-up by: Manon Hilding,  June 01, 2009 11:58 AM  Additional Follow-up for Phone Call Additional follow up Details #1::        pt will need to keep appt to see provider unless there is a cancellation. If she feels worse before the appt she needs to go to the urgent care and if she does that she needs to cancel her appt. Additional Follow-up by: Mikey College CMA,  June 01, 2009 4:05 PM    Additional Follow-up for Phone Call Additional follow up Details #2::    I spoke with the pt and she will wait until tomorrow because of the bad weather.Manon Hilding  June 03, 2009 10:03 AM

## 2010-06-22 NOTE — Progress Notes (Signed)
Summary: GYN update  Phone Note From Other Clinic   Caller: Receptionist Summary of Call: [facility] and [facility], Wanted to make you aware of a situation with a patient today.  Ms Meghan Mclean, MRN [id number], was scheduled for an appointment in our office today for a consult for a tubal ligation.  This patient is indigent with 100% write off.  The health system agreed several years ago that sterilizations must be paid for up front (no payment plans are allowed for this procedure).  There are many options to prevent pregnancy and without payment up front for a tubal, the patient will not be scheduled for surgery.  I spoke with this patient.  She was very upset as she thought this would be covered by the charity care policy or that she could make payments.  I explained that this was a very costly procedure greater than $5000.00 though I didn't have an exact price.  I explained there would be anesthesia, pathology, surgeon and operating room charges.  I told her we would be happy to see her if she would like to discuss other possible options as she said she wasn't able to pay up front, she decided she would leave.  Please make your providers and staff aware of this policy so hopefully we can prevent this issue with another patient.  Also, let me know if you have any questions. Thanks in advance for your help! Tresa Endo   Initial call taken by: Candi Leash,  September 02, 2009 3:14 PM  Follow-up for Phone Call        Reviewed and comments noted Follow-up by: Lehman Prom FNP,  September 02, 2009 5:28 PM

## 2010-06-22 NOTE — Progress Notes (Signed)
Summary: NEED MEDS REFIL  Phone Note Call from Patient Call back at [phone]   Refills Requested: Medication #1:  CALCIUM + D 600-200 MG-UNIT TABS One tablet by mouth two times a day Summary of Call: NEED REFIL FOR ALLEGRA [facility] Initial call taken by: Domenic Polite,  April 01, 2010 9:14 AM  Follow-up for Phone Call        Refills for Calcium and Xyzal completed. Dutch Quint RN  April 01, 2010 9:25 AM     Prescriptions: XYZAL 5 MG TABS (LEVOCETIRIZINE DIHYDROCHLORIDE) One tablet by mouth daily for allergies  #30 x 3   Entered by:   Dutch Quint RN   Authorized by:   Lehman Prom FNP   Signed by:   Dutch Quint RN on 04/01/2010   Method used:   Faxed to ...       [facility] - Pharmac (retail)       [address] [place], [place]  [address]       Ph: [phone]       Fax: [phone]   RxID:   [id number] CALCIUM + D 600-200 MG-UNIT TABS (CALCIUM CARBONATE-VITAMIN D) One tablet by mouth two times a day  #60 x 3   Entered by:   Dutch Quint RN   Authorized by:   Lehman Prom FNP   Signed by:   Dutch Quint RN on 04/01/2010   Method used:   Faxed to ...       [facility] - Pharmac (retail)       [address] [place], [place]  [address]       Ph: [phone]       Fax: [phone]   RxID:   [id number]

## 2010-06-22 NOTE — Progress Notes (Signed)
Summary: side effects from medication  Phone Note Call from Patient   Summary of Call: The pt states that the medication that she is taking for her allergies cause her respiraton problems and she feels constantly nervous.  Pt always go to [facility] Sherian Maroon) Daphine Deutscher FNP  Initial call taken by: Manon Hilding,  December 31, 2009 9:33 AM  Follow-up for Phone Call        Left message on answering machine for pt. to return call.  Dutch Quint RN  January 05, 2010 3:00 PM  States she thinks the Ceron-DM has been  causing some of her symptoms.  She told the ED physician last night and he told her that Allegra-D should be used in place of that.   Also thinks that Advair 250 mg. is causing the anxiety and respiratory symptoms, wants to go back to 150 mg. Follow-up by: Dutch Quint RN,  January 06, 2010 11:36 AM  Additional Follow-up for Phone Call Additional follow up Details #1::        Pt can stop the ceron DM - she should be taking either the ceron DM or the allegra D. She reported during last visit that she did not have any allegra D during recent visit so i ordered the ceron DM which was less expensive than the allegra D over the counter. Regarding the advair - 100/50 was not effective as pt still returned with frequent flares so she was increased to 250/50 which does not seem to be doing much better since she still returns to frequent flares. The problem with all this is that pt is NOT taking meds regularly Additional Follow-up by: Lehman Prom FNP,  January 06, 2010 11:39 AM    Additional Follow-up for Phone Call Additional follow up Details #2::    Advised of provider's instructions and recommendations.  States that she does better with the lower-dose Advair; that she doesn't feel anxious or jittery, so that's what she's been taking.  Advised to bring all of her medications to her next appt. with provider - 01/18/10 - and discuss it then.  Verbalized understanding and agreement.     Follow-up by: Dutch Quint RN,  January 06, 2010 2:50 PM

## 2010-06-22 NOTE — Assessment & Plan Note (Signed)
Summary: Acute - Allergic Rhinits   Vital Signs:  Patient profile:   35 year old female Menstrual status:  regular Weight:      155.7 pounds BMI:     31.03 O2 Sat:      97 % on Room air Temp:     98.1 degrees F oral Pulse rate:   78 / minute Pulse rhythm:   regular Resp:     20 per minute BP sitting:   102 / 72  (left arm) Cuff size:   regular  Vitals Entered By: Levon Hedger (December 28, 2009 11:54 AM)  Nutrition Counseling: Patient's BMI is greater than 25 and therefore counseled on weight management options.  O2 Flow:  Room air CC: Headache Is Patient Diabetic? No  Does patient need assistance? Functional Status Self care Ambulation Normal   Primary Care Provider:  Lehman Prom FNP  CC:  Headache.  History of Present Illness:  Pt into the office with c/o headache Started 1 week ago Last night she was unable to sleep +ear pain  +sore throat Pt has been without her medications for 2 weeks.  Reports that she has called refills into the pharmacy but is waiting until this week to refill. Pt has never been to an allergist, so she does not know to what she is allergic.    Headache HPI:      The patient comes in for an acute, first time visit for headaches.  The headaches will last anywhere from 30 minutes to several days at a time.        The headaches are not associated with an aura.  The location of the headaches are occipital.  Headache quality is pressure or tightness.  Aggravating factors include physical activity, head movement, and coughing or sneezing.         Allergies: 1)  ! Ibuprofen  Review of Systems General:  Denies fever. ENT:  Complains of earache, nasal congestion, and sore throat. CV:  Denies chest pain or discomfort. Resp:  Denies cough, shortness of breath, and wheezing. Neuro:  Complains of headaches.  Physical Exam  General:  alert.   Head:  normocephalic.   Ears:  bil TM with clear fluid behind TM Lungs:  normal breath sounds.    Heart:  normal rate and regular rhythm.   Msk:  up to the exam table Neurologic:  gait normal.     Impression & Recommendations:  Problem # 1:  ALLERGIC RHINITIS (ICD-477.9) advised pt to pick up meds this week from the pharmacy pt needs allergy testing - will check on pricing for pt will give cough meds with decongestant - pt was not able to tolerate the allergy D Her updated medication list for this problem includes:    Nasacort Aq 55 Mcg/act Aers (Triamcinolone acetonide(nasal)) ..... One spray in each nostril two times a day **d/c flonase**    Xyzal 5 Mg Tabs (Levocetirizine dihydrochloride) ..... One tablet by mouth daily for allergies  Problem # 2:  REACTIVE AIRWAY DISEASE (ICD-493.90) stable Her updated medication list for this problem includes:    Ventolin Hfa 108 (90 Base) Mcg/act Aers (Albuterol sulfate) .Marland Kitchen... 2 puffs ever 6 hours as needed for shortness of breath    Singulair 10 Mg Tabs (Montelukast sodium) .Marland Kitchen... 1 tablet by mouth at night for asthma    Advair Diskus 250-50 Mcg/dose Aepb (Fluticasone-salmeterol) ..... One inhalation two times a day  *note change in dose**  Orders: Pulse Oximetry (single measurment) (98119)  Problem # 3:  HEADACHE (ICD-784.0) likely due to congestion advised pt to take tylenol as needed   Problem # 4:  IUD (ICD-V25.1) s/p removal for 2 weeks which has subsided at this time pt will schedule appt for pelvic exam  Complete Medication List: 1)  Ventolin Hfa 108 (90 Base) Mcg/act Aers (Albuterol sulfate) .... 2 puffs ever 6 hours as needed for shortness of breath 2)  Singulair 10 Mg Tabs (Montelukast sodium) .Marland Kitchen.. 1 tablet by mouth at night for asthma 3)  Advair Diskus 250-50 Mcg/dose Aepb (Fluticasone-salmeterol) .... One inhalation two times a day  *note change in dose** 4)  Nexium 40 Mg Cpdr (Esomeprazole magnesium) .... One tablet by mouth daily before breakfast for stomach 5)  Multivitamins Tabs (Multiple vitamin) .Marland Kitchen.. 1 tablet by  mouth daily 6)  Ferrous Sulfate 325 (65 Fe) Mg Tbec (Ferrous sulfate) .... One tablet by mouth three times a day 7)  Calcium + D 600-200 Mg-unit Tabs (Calcium carbonate-vitamin d) .... One tablet by mouth two times a day 8)  Nasacort Aq 55 Mcg/act Aers (Triamcinolone acetonide(nasal)) .... One spray in each nostril two times a day **d/c flonase** 9)  Xyzal 5 Mg Tabs (Levocetirizine dihydrochloride) .... One tablet by mouth daily for allergies 10)  Ceron-dm 12.09-23-13 Mg/6ml Syrp (Phenylephrine-chlorphen-dm) .... One teaspoon every 6 hours as needed for congestion  Patient Instructions: 1)  Schedule an appointment for a complete physical exam 2)  Come fasting for this appointment 3)  Get your medications from the pharmacy. Prescriptions: CERON-DM 12.09-23-13 MG/5ML SYRP (PHENYLEPHRINE-CHLORPHEN-DM) One teaspoon every 6 hours as needed for congestion  #127ml x 0   Entered and Authorized by:   Lehman Prom FNP   Signed by:   Lehman Prom FNP on 12/28/2009   Method used:   Print then Give to Patient   RxID:   [id number]

## 2010-06-22 NOTE — Progress Notes (Signed)
Summary: EARACHE (SPANISH)   Phone Note Call from Patient   Complaint: Earache/Ear Infection Summary of Call: PT IS BEING HAVING A SEVERE EARACHE .IF NFP MARTIN CAN PRESCRIBE SOMENTHING . Fuller Song HER @ [phone]  [facility] YOU  Initial call taken by: Cheryll Dessert,  January 01, 2010 10:41 AM  Follow-up for Phone Call        Left message on answering machine for pt. to return call.  Dutch Quint RN  January 05, 2010 3:00 PM  Still having an earache, with headache.  Went to ED last night.  Checked her ears, gave her Amoxicillin x10 days three times a day.  Also given rx for nasal spray and Allegra-D which she has not filled yet.  Is feeling better -- also taking Claritin.  Told to f/u with provider. - has appt. 01/18/10 for CPP. Follow-up by: Dutch Quint RN,  January 06, 2010 11:27 AM

## 2010-06-22 NOTE — Letter (Signed)
Summary: Lipid Letter  [facility]  [address] [place], [place] [address]   Phone: [phone]  Fax: [phone]    04/02/2010  [facility] [address] [place], [place]  [address]  Dear Marianna Fuss:  We have carefully reviewed your last lipid profile from 04/01/2010 and the results are noted below with a summary of recommendations for lipid management.    Cholesterol:       136     Goal: less than 200   HDL "good" Cholesterol:   41     Goal: greater than 40   LDL "bad" Cholesterol:   77     Goal: less than 130   Triglycerides:       88     Goal: less than 150    Cholesterol labs are normal.      Current Medications: 1)    Ventolin Hfa 108 (90 Base) Mcg/act Aers (Albuterol sulfate) .... 2 puffs ever 6 hours as needed for shortness of breath 2)    Singulair 10 Mg Tabs (Montelukast sodium) .Marland Kitchen.. 1 tablet by mouth at night for asthma 3)    Advair Diskus 250-50 Mcg/dose Aepb (Fluticasone-salmeterol) .... One inhalation two times a day  *note change in dose** 4)    Nexium 40 Mg Cpdr (Esomeprazole magnesium) .... One tablet by mouth daily before breakfast for stomach 5)    Multivitamins  Tabs (Multiple vitamin) .Marland Kitchen.. 1 tablet by mouth daily 6)    Ferrous Sulfate 325 (65 Fe) Mg Tbec (Ferrous sulfate) .... One tablet by mouth three times a day 7)    Calcium + D 600-200 Mg-unit Tabs (Calcium carbonate-vitamin d) .... One tablet by mouth two times a day 8)    Nasacort Aq 55 Mcg/act Aers (Triamcinolone acetonide(nasal)) .... One spray in each nostril two times a day **d/c flonase** 9)    Xyzal 5 Mg Tabs (Levocetirizine dihydrochloride) .... One tablet by mouth daily for allergies 10)    Ceron-dm 12.09-23-13 Mg/12ml Syrp (Phenylephrine-chlorphen-dm) .... One teaspoon every 6 hours as needed for congestion  If you have any questions, please call. We appreciate being able to work with you.   Sincerely,    [facility] Lehman Prom FNP

## 2010-06-24 NOTE — Letter (Signed)
Summary: Handout Printed  Printed Handout:  - Ear - Middle, Infection (Otitis Media), Adult

## 2010-06-24 NOTE — Letter (Signed)
Summary: Handout Printed  Printed Handout:  - Intestinal Parasites (Worms)-Brief

## 2010-06-24 NOTE — Progress Notes (Signed)
Summary: ALLEGRA REFILL   Phone Note Refill Request   PT NEED THE ALLEGRA  REFILL AND PT USE  THE [facility] . PLEASE,CALL HER @! [phone]  Initial call taken by: Cheryll Dessert,  May 03, 2010 12:31 PM  Follow-up for Phone Call        Allegra changed to Xyzal in June -- still has Xyzal refills left at [facility] pharmacy.  Dutch Quint RN  May 03, 2010 12:43 PM   Additional Follow-up for Phone Call Additional follow up Details #1::        xyzal was refilled in november with 3 refills. confirm with pharmacy that it was received and then notify pt regarding refills Additional Follow-up by: Lehman Prom FNP,  May 03, 2010 3:49 PM    Additional Follow-up for Phone Call Additional follow up Details #2::    Confirmed with pharmacy -- Per interpreter -- pt. states she will call [place] pharmacy for refills tomorrow. Dutch Quint RN  May 03, 2010 4:45 PM

## 2010-06-24 NOTE — Assessment & Plan Note (Signed)
Summary: Right - Otitis Media   Vital Signs:  Patient profile:   35 year old female Menstrual status:  regular LMP:     04/2010 Weight:      152.3 pounds BMI:     30.36 O2 Sat:      95 % on Room air Temp:     97.7 degrees F oral Pulse rate:   100 / minute Pulse rhythm:   regular Resp:     20 per minute BP sitting:   110 / 70  (left arm) Cuff size:   regular  Vitals Entered By: Levon Hedger (June 11, 2010 10:57 AM)  Nutrition Counseling: Patient's BMI is greater than 25 and therefore counseled on weight management options.  O2 Flow:  Room air  Serial Vital Signs/Assessments:  Comments: 11:10 AM P/F  340,  320,  300 By: Levon Hedger   CC: still has cough and pain in her ears with green phlem Is Patient Diabetic? No Pain Assessment Patient in pain? no       Does patient need assistance? Functional Status Self care Ambulation Normal LMP (date): 04/2010 LMP - Character: normal     Enter LMP: 04/2010 Last PAP Result  Specimen Adequacy: Satisfactory for evaluation.   Interpretation/Result:Negative for intraepithelial Lesion or Malignancy.      Primary Care Provider:  Lehman Prom FNP  CC:  still has cough and pain in her ears with green phlem.  History of Present Illness:  Pt into the office for follow up on cough as seen on prevoius visit Still with cough Pt was instructed to take Theraflu over the counter which has not helped. +productive cough -fever +headache +right ear pain +sore throat especially when coughing  Community liason present today in office - Spanish   Allergies (verified): 1)  ! Ibuprofen 2)  ! Aspirin  Review of Systems General:  Denies fever. ENT:  Complains of earache, nasal congestion, sinus pressure, and sore throat. CV:  Denies chest pain or discomfort. Resp:  Complains of cough. GI:  Denies abdominal pain, nausea, and vomiting.  Physical Exam  General:  alert.   Head:  normocephalic.   Eyes:  pupils  round.   Ears:  right - TM erythema left ear - clear fluid Nose:  inflammed turbinates Mouth:  fair dentition.   Lungs:  basilar wheezes bilaterally Heart:  normal rate and regular rhythm.   Neurologic:  alert & oriented X3.     Impression & Recommendations:  Problem # 1:  OTITIS MEDIA, RIGHT (ICD-382.9)  handout given to pt  Her updated medication list for this problem includes:    Amoxicillin 500 Mg Tabs (Amoxicillin) ..... One tablet by mouth three times a day for infection  Problem # 2:  ALLERGIC RHINITIS (ICD-477.9) pt would benefit from allergy shots but she is not able to afford she was instructed to take meds as ordered Her updated medication list for this problem includes:    Rhinocort Aqua 32 Mcg/act Susp (Budesonide) ..... One spray in each nostril two times a day    Xyzal 5 Mg Tabs (Levocetirizine dihydrochloride) ..... One tablet by mouth daily for allergies  Problem # 3:  REACTIVE AIRWAY DISEASE (ICD-493.90) few scattered wheezes today advised pt to to be sure she is taking meds as  ordered Her updated medication list for this problem includes:    Ventolin Hfa 108 (90 Base) Mcg/act Aers (Albuterol sulfate) .Marland Kitchen... 2 puffs ever 6 hours as needed for shortness of breath  Singulair 10 Mg Tabs (Montelukast sodium) .Marland Kitchen... 1 tablet by mouth at night for asthma    Advair Diskus 100-50 Mcg/dose Aepb (Fluticasone-salmeterol) ..... One inhalation two times a day **note change in dose**  Orders: Peak Flow Rate (94150) Pulse Oximetry (single measurment) (94760)  Complete Medication List: 1)  Ventolin Hfa 108 (90 Base) Mcg/act Aers (Albuterol sulfate) .... 2 puffs ever 6 hours as needed for shortness of breath 2)  Singulair 10 Mg Tabs (Montelukast sodium) .Marland Kitchen.. 1 tablet by mouth at night for asthma 3)  Advair Diskus 100-50 Mcg/dose Aepb (Fluticasone-salmeterol) .... One inhalation two times a day **note change in dose** 4)  Nexium 40 Mg Cpdr (Esomeprazole magnesium) .... One  tablet by mouth daily before breakfast for stomach 5)  Multivitamins Tabs (Multiple vitamin) .Marland Kitchen.. 1 tablet by mouth daily 6)  Ferrous Sulfate 325 (65 Fe) Mg Tbec (Ferrous sulfate) .... One tablet by mouth three times a day 7)  Calcium + D 600-200 Mg-unit Tabs (Calcium carbonate-vitamin d) .... One tablet by mouth two times a day 8)  Rhinocort Aqua 32 Mcg/act Susp (Budesonide) .... One spray in each nostril two times a day 9)  Xyzal 5 Mg Tabs (Levocetirizine dihydrochloride) .... One tablet by mouth daily for allergies 10)  Vermox 100mg   .... One tblet by mouth two times a day x 3 days 11)  Amoxicillin 500 Mg Tabs (Amoxicillin) .... One tablet by mouth three times a day for infection  Asthma Management Plan    Asthma Severity: Moderate Persistent    Control Assessment: Not Well Controlled    Personal best PEF: 340 liters/minute    Predicted PEF: 473 liters/minute    Working PEF: 390 liters/minute    Plan based on PEF formula: [place] [employer] Zone: (Range: 310 to 390) ADVAIR DISKUS 100-50 MCG/DOSE AEPB:  1 inhalation twice a day SINGULAIR 10 MG TABS:  1 tablet daily  Yellow Zone: VENTOLIN HFA 108 (90 BASE) MCG/ACT AERS:  2 puffs every 4 hours as needed  Red Zone:  Patient Instructions: 1)  You should go with your children to their visit today.  Try to see how much the medication is from the [facility] since the health department did not have it and since you do not have medicaid. 2)  Ear infection - Take Amoxil 500mg  by mouth three times a day for ear infection 3)  Follow up as needed Prescriptions: AMOXICILLIN 500 MG TABS (AMOXICILLIN) One tablet by mouth three times a day for infection  #30 x 0   Entered and Authorized by:   Lehman Prom FNP   Signed by:   Lehman Prom FNP on 06/11/2010   Method used:   Print then Give to Patient   RxID:   [id number]    Orders Added: 1)  Est. Patient Level III [10272] 2)  Peak Flow Rate [94150] 3)  Pulse Oximetry (single  measurment) [53664]

## 2010-06-24 NOTE — Assessment & Plan Note (Addendum)
Summary: URI   Vital Signs:  Patient profile:   35 year old female Menstrual status:  regular LMP:     04/07/2010 Weight:      152.6 pounds BMI:     30.42 O2 Sat:      95 % on Room air Temp:     97.5 degrees F Pulse rate:   94 / minute Pulse rhythm:   regular Resp:     20 per minute BP sitting:   94 / 70  (left arm) Cuff size:   regular  Vitals Entered By: Levon Hedger (May 20, 2010 10:37 AM)  Nutrition Counseling: Patient's BMI is greater than 25 and therefore counseled on weight management options.  O2 Flow:  Room air  Serial Vital Signs/Assessments:  Comments: 11:59 AM 360, 360, 380 By: Lehman Prom FNP   CC: sore throat, cough , congestion x 1 week...pt states that her family sick and she was the last one to get it Is Patient Diabetic? No Pain Assessment Patient in pain? yes     Location: abdomen  Does patient need assistance? Functional Status Self care Ambulation Normal LMP (date): 04/07/2010 LMP - Character: normal     Enter LMP: 04/07/2010 Last PAP Result  Specimen Adequacy: Satisfactory for evaluation.   Interpretation/Result:Negative for intraepithelial Lesion or Malignancy.      Primary Care Paz Fuentes:  Lehman Prom FNP  CC:  sore throat, cough , and congestion x 1 week...pt states that her family sick and she was the last one to get it.  History of Present Illness:  Pt into the office with c/o sore throat and lower abdominal pain. Pt started with sore throat 3 days ago. Cough that started 1 week ago +headache +fever +nasal congestion Pt has taken Suphedrine PE over the counter on yesterday without resolution of the symptoms.  Pt did not bring the medications with her today.  Request to decrease advair back to 100/50 dose. states that 250/50 is too strong  TEFL teacher in office to interpret   Allergies (verified): 1)  ! Ibuprofen 2)  ! Aspirin  Review of Systems General:  Complains of fever. ENT:  Complains of  difficulty swallowing, nasal congestion, and sore throat. Resp:  Complains of cough; denies wheezing. GI:  Complains of abdominal pain, change in bowel habits, and nausea; denies vomiting; Pt reports that she had a BM 3 weeks ago and she noticed some parasites in her feces.  Itchy rectum. No recent travel. Admits that she does walk barefooted sometimes outside.  Physical Exam  General:  alert.   Head:  normocephalic.   Ears:  bil TM with bony landmarks present Nose:  inflammed Mouth:  tonsillar enlargment Lungs:  few scattered wheezes Heart:  normal rate and regular rhythm.   Abdomen:  normal bowel sounds.   Msk:  up to the exam table Neurologic:  alert & oriented X3.     Impression & Recommendations:  Problem # 1:  VIRAL URI (ICD-465.9) advised pt to take theraflu as needed for symptoms rapid strep is negative The following medications were removed from the medication list:    Ceron-dm 12.09-23-13 Mg/25ml Syrp (Phenylephrine-chlorphen-dm) ..... One teaspoon every 6 hours as needed for congestion Her updated medication list for this problem includes:    Xyzal 5 Mg Tabs (Levocetirizine dihydrochloride) ..... One tablet by mouth daily for allergies  Orders: Rapid Strep (16109)  Problem # 2:  UNSPECIFIED INFECTIOUS AND PARASITIC DISEASES (ICD-136.9) will treat pt based on report advised her to  have her 6 children assessed by their PCP  Problem # 3:  REACTIVE AIRWAY DISEASE (ICD-493.90) pt is still not completely complaint with her meds - she reports she is going to get refills today Her updated medication list for this problem includes:    Ventolin Hfa 108 (90 Base) Mcg/act Aers (Albuterol sulfate) .Marland Kitchen... 2 puffs ever 6 hours as needed for shortness of breath    Singulair 10 Mg Tabs (Montelukast sodium) .Marland Kitchen... 1 tablet by mouth at night for asthma    Advair Diskus 100-50 Mcg/dose Aepb (Fluticasone-salmeterol) ..... One inhalation two times a day **note change in  dose**  Orders: Pulse Oximetry (single measurment) (94760) Peak Flow Rate (94150)  Complete Medication List: 1)  Ventolin Hfa 108 (90 Base) Mcg/act Aers (Albuterol sulfate) .... 2 puffs ever 6 hours as needed for shortness of breath 2)  Singulair 10 Mg Tabs (Montelukast sodium) .Marland Kitchen.. 1 tablet by mouth at night for asthma 3)  Advair Diskus 100-50 Mcg/dose Aepb (Fluticasone-salmeterol) .... One inhalation two times a day **note change in dose** 4)  Nexium 40 Mg Cpdr (Esomeprazole magnesium) .... One tablet by mouth daily before breakfast for stomach 5)  Multivitamins Tabs (Multiple vitamin) .Marland Kitchen.. 1 tablet by mouth daily 6)  Ferrous Sulfate 325 (65 Fe) Mg Tbec (Ferrous sulfate) .... One tablet by mouth three times a day 7)  Calcium + D 600-200 Mg-unit Tabs (Calcium carbonate-vitamin d) .... One tablet by mouth two times a day 8)  Rhinocort Aqua 32 Mcg/act Susp (Budesonide) .... One spray in each nostril two times a day 9)  Xyzal 5 Mg Tabs (Levocetirizine dihydrochloride) .... One tablet by mouth daily for allergies 10)  Vermox 100mg   .... One tblet by mouth two times a day x 3 days  Other Orders: Urine Pregnancy Test  (81191)  Asthma Management Plan    Asthma Severity: Moderate Persistent    Control Assessment: Not Well Controlled    Personal best PEF: 390 liters/minute    Predicted PEF: 473 liters/minute    Working PEF: 390 liters/minute    Plan based on PEF formula: [employer] [employer] Zone: (Range: 310 to 390) ADVAIR DISKUS 100-50 MCG/DOSE AEPB:  1 inhalation twice a day SINGULAIR 10 MG TABS:  1 tablet daily  Yellow Zone: VENTOLIN HFA 108 (90 BASE) MCG/ACT AERS:  2 puffs every 4 hours as needed  Red Zone:  Patient Instructions: 1)  You have a viral illness. 2)  This will improve over time. 3)  You should gargle with warm salt water 4)  Drink warm tea to help soothe your throat 5)  Strep throat is negative 6)  Pregnancy is negative 7)  You should get Theraflu over the  counter. You will mix with warm water, lemon and tea if you would like. 8)  Follow up as needed. Prescriptions: VERMOX 100MG  One tblet by mouth two times a day x 3 days  #6 x 0   Entered and Authorized by:   Lehman Prom FNP   Signed by:   Lehman Prom FNP on 05/20/2010   Method used:   Print then Give to Patient   RxID:   [id number] RHINOCORT AQUA 32 MCG/ACT SUSP (BUDESONIDE) One spray in each nostril two times a day  #1 x 5   Entered and Authorized by:   Lehman Prom FNP   Signed by:   Lehman Prom FNP on 05/20/2010   Method used:   Faxed to ...       [facility] (retail)       [address] [place], [place]  [address]       Ph: [phone]       Fax: [phone]   RxID:   [id number] SINGULAIR 10 MG TABS (MONTELUKAST SODIUM) 1 tablet by mouth at night for asthma  #30 x 5   Entered and Authorized by:   Lehman Prom FNP   Signed by:   Lehman Prom FNP on 05/20/2010   Method used:   Faxed to ...       [facility] - [facility] (retail)       [address] [place], [place]  [address]       Ph: [phone]       Fax: [phone]   RxID:   [id number] ADVAIR DISKUS 100-50 MCG/DOSE AEPB (FLUTICASONE-SALMETEROL) One inhalation two times a day **note change in dose**  #1 x 5   Entered and Authorized by:   Lehman Prom FNP   Signed by:   Lehman Prom FNP on 05/20/2010   Method used:   Faxed to ...       [facility] - [facility] (retail)       [address] [place], [place]  [address]       Ph: [phone]       Fax: [phone]   RxID:   [id number]    Orders Added: 1)  Est. Patient Level III [69485] 2)  Pulse Oximetry (single measurment) [94760] 3)  Peak Flow Rate [94150] 4)  Rapid Strep [87880] 5)  Urine Pregnancy Test  [81025]    Laboratory Results   Urine Tests  Date/Time Received: May 20, 2010 11:59  AM     Urine HCG: negative Date/Time Received: May 20, 2010 11:59 AM   Other Tests  Rapid Strep: negative   Appended Document: URI    Clinical Lists Changes  Medications: Changed medication from * VERMOX 100MG  One tblet by mouth two times a day x 3 days to * MEBENDAZOLE 100MG  One tblet by mouth two times a day x 3 days - Signed Rx of MEBENDAZOLE 100MG  One tblet by mouth two times a day x 3 days;  #6 x 0;  Signed;  Entered by: Lehman Prom FNP;  Authorized by: Lehman Prom FNP;  Method used: Printed then faxed to [facility], [address] [place], [place]  [address], Ph: [phone], Fax: [phone]    Prescriptions: MEBENDAZOLE 100MG  One tblet by mouth two times a day x 3 days  #6 x 0   Entered and Authorized by:   Lehman Prom FNP   Signed by:   Lehman Prom FNP on 06/16/2010   Method used:   Printed then faxed to ...       [facility] - [facility] (retail)       [address] [place], [place]  [address]       Ph: [phone]       Fax: [phone]   RxID:   [id number]

## 2010-07-01 ENCOUNTER — Encounter (INDEPENDENT_AMBULATORY_CARE_PROVIDER_SITE_OTHER): Payer: Self-pay | Admitting: Nurse Practitioner

## 2010-07-08 NOTE — Miscellaneous (Signed)
Summary: Allergy refeferral  Clinical Lists Changes  Orders: Added new Referral order of Allergy Referral  (Allergy) - Signed

## 2010-07-13 ENCOUNTER — Encounter (INDEPENDENT_AMBULATORY_CARE_PROVIDER_SITE_OTHER): Payer: Self-pay | Admitting: Nurse Practitioner

## 2010-07-20 NOTE — Miscellaneous (Signed)
Summary: New meds  Clinical Lists Changes Gave verbal to [facility] - ok to refill meds She has not filled any Rx since 04/2010 with the exception of nasal spray anyway Medications: Removed medication of AMOXICILLIN 500 MG TABS (AMOXICILLIN) One tablet by mouth three times a day for infection Added new medication of CEFTIN 250 MG TABS (CEFUROXIME AXETIL) Rx per Allergist Added new medication of NASONEX 50 MCG/ACT SUSP (MOMETASONE FUROATE) Rx per allergist Added new medication of ADVAIR HFA 115-21 MCG/ACT AERO (FLUTICASONE-SALMETEROL) Rx by allergist

## 2010-07-23 ENCOUNTER — Encounter (INDEPENDENT_AMBULATORY_CARE_PROVIDER_SITE_OTHER): Payer: Self-pay | Admitting: Nurse Practitioner

## 2010-07-27 ENCOUNTER — Encounter: Payer: Self-pay | Admitting: Nurse Practitioner

## 2010-07-27 ENCOUNTER — Encounter (INDEPENDENT_AMBULATORY_CARE_PROVIDER_SITE_OTHER): Payer: Self-pay | Admitting: Nurse Practitioner

## 2010-08-03 NOTE — Assessment & Plan Note (Signed)
Summary: F/u visit with [facility]erg[facility]   Vital Signs:  Patient profile:   35 year old female Menstrual status:  regular LMP:     07/24/2010 Weight:      151.2 pounds BMI:     30.14 O2 Sat:      99 % on Room air Pulse rate:   83 / minute Pulse rhythm:   regular Resp:     20 per minute BP sitting:   98 / 66  (left arm) Cuff size:   regular  Vitals Entered By: Levon Hedger (July 27, 2010 10:46 AM)  Nutrition Counseling: Patient's BMI is greater than 25 and therefore counseled on weight management options.  O2 Flow:  Room air  Serial Vital Signs/Assessments:  Comments: 11:30 AM 400, 400, 410 (done by Levon Hedger) By: Lehman Prom FNP   CC: reaction to medication from allergy specialist  Is Patient Diabetic? No Pain Assessment Patient in pain? no       Does patient need assistance? Functional Status Self care Ambulation Normal LMP (date): 07/24/2010 LMP - Character: normal     Enter LMP: 07/24/2010 Last PAP Result  Specimen Adequacy: Satisfactory for evaluation.   Interpretation/Result:Negative for intraepithelial Lesion or Malignancy.      Primary Care Provider:  Lehman Prom FNP  CC:  reaction to medication from allergy specialist .  History of Present Illness:  Pt into the office for f/u - she has been to the allergist as ordered. Pt presents today with written corrsponsence from the allergiest. On initial visit pt was instructed 1.  Avoidance measures 2.  Treat Inflammation - nasaonex, advair, singulair and prednisone 3.  If needed pt is to take cetirizine or xyzal or ventolin 4. pt was instructed to start ceftin 250mg  by mouth two times a day for 14 days (she has 9 pills left) 5.  Pt was to return on Friday March 2nd for skin testing.  She was instructed not to take xyzal, cetirizine for days before the testing.  Pt admits that she did return for the testing Pt was restarted on Prednison 10mg  - 2 tablets once daily for 1 weeks (pt has  these pills with her today) Continue all other medications Avoidance measures Return in 4 weeks for results  Pt admits that she has some problems with the advair - 230/21 as given per the allergiest.  she reports that when she takes it the medication makes her not able to sleep.  She would prefer to start back on the advair diskus as started by this office  Allergies (verified): 1)  ! Ibuprofen 2)  ! Aspirin  Review of Systems CV:  Denies chest pain or discomfort. Resp:  Denies shortness of breath and wheezing; symptoms improved since taking meds from [facility]. Neuro:  Complains of headaches; increased about 2 weeks ago.Marland Kitchen  Physical Exam  General:  alert.   Head:  normocephalic.   Nose:  nasal congestion Lungs:  normal breath sounds.   Heart:  normal rate and regular rhythm.   Neurologic:  alert & oriented X3.     Impression & Recommendations:  Problem # 1:  ALLERGIC RHINITIS (ICD-477.9) Pt instructed to take meds as ordered by allergiest ok to change back to advair 100/50 as she is able tolerate Her updated medication list for this problem includes:    Rhinocort Aqua 32 Mcg/act Susp (Budesonide) ..... One spray in each nostril two times a day    Xyzal 5 Mg Tabs (Levocetirizine dihydrochloride) ..... One tablet by  mouth daily for allergies    Nasonex 50 Mcg/act Susp (Mometasone furoate) .Marland Kitchen... Rx per allergist  Problem # 2:  REACTIVE AIRWAY DISEASE (ICD-493.90)  Her updated medication list for this problem includes:    Ventolin Hfa 108 (90 Base) Mcg/act Aers (Albuterol sulfate) .Marland Kitchen... 2 puffs ever 6 hours as needed for shortness of breath    Singulair 10 Mg Tabs (Montelukast sodium) .Marland Kitchen... 1 tablet by mouth at night for asthma    Advair Diskus 100-50 Mcg/dose Aepb (Fluticasone-salmeterol) ..... One inhalation two times a day **note change in dose**    Advair Hfa 115-21 Mcg/act Aero (Fluticasone-salmeterol) .Marland Kitchen... Rx by allergist  Orders: Peak Flow Rate (94150) Pulse  Oximetry (single measurment) (94760)  Complete Medication List: 1)  Ventolin Hfa 108 (90 Base) Mcg/act Aers (Albuterol sulfate) .... 2 puffs ever 6 hours as needed for shortness of breath 2)  Singulair 10 Mg Tabs (Montelukast sodium) .Marland Kitchen.. 1 tablet by mouth at night for asthma 3)  Advair Diskus 100-50 Mcg/dose Aepb (Fluticasone-salmeterol) .... One inhalation two times a day **note change in dose** 4)  Nexium 40 Mg Cpdr (Esomeprazole magnesium) .... One tablet by mouth daily before breakfast for stomach 5)  Multivitamins Tabs (Multiple vitamin) .Marland Kitchen.. 1 tablet by mouth daily 6)  Ferrous Sulfate 325 (65 Fe) Mg Tbec (Ferrous sulfate) .... One tablet by mouth three times a day 7)  Calcium + D 600-200 Mg-unit Tabs (Calcium carbonate-vitamin d) .... One tablet by mouth two times a day 8)  Rhinocort Aqua 32 Mcg/act Susp (Budesonide) .... One spray in each nostril two times a day 9)  Xyzal 5 Mg Tabs (Levocetirizine dihydrochloride) .... One tablet by mouth daily for allergies 10)  Mebendazole 100mg   .... One tblet by mouth two times a day x 3 days 11)  Ceftin 250 Mg Tabs (Cefuroxime axetil) .... Rx per allergist 12)  Nasonex 50 Mcg/act Susp (Mometasone furoate) .... Rx per allergist 13)  Advair Hfa 115-21 Mcg/act Aero (Fluticasone-salmeterol) .... Rx by allergist  Patient Instructions: 1)  Keep your appointment with the allergiest as ordered in 4 weeks.  it is very important that you find out what you are allergic to and determine if you are a candidate for the injection 2)  Keep taking all the medications as ordered 3)  May take zyrtec or tylenol for headache.   4)  Follow up here as needed   Orders Added: 1)  Est. Patient Level III [[address]] 2)  Peak Flow Rate [94150] 3)  Pulse Oximetry (single measurment) [98119]

## 2010-08-08 LAB — CBC
Hemoglobin: 14.8 g/dL (ref 12.0–15.0)
MCHC: 34.7 g/dL (ref 30.0–36.0)
RDW: 12.8 % (ref 11.5–15.5)
WBC: 7.4 10*3/uL (ref 4.0–10.5)

## 2010-08-08 LAB — POCT PREGNANCY, URINE: Preg Test, Ur: NEGATIVE

## 2010-08-09 LAB — COMPREHENSIVE METABOLIC PANEL
ALT: 25 U/L (ref 0–35)
AST: 18 U/L (ref 0–37)
Albumin: 4 g/dL (ref 3.5–5.2)
Calcium: 9.7 mg/dL (ref 8.4–10.5)
GFR calc Af Amer: >60 mL/min (ref >60)
Sodium: 139 mEq/L (ref 135–145)
Total Protein: 7.4 g/dL (ref 6.0–8.3)

## 2010-08-09 LAB — DIFFERENTIAL
Eosinophils Absolute: 0.3 10*3/uL (ref 0.0–0.7)
Eosinophils Relative: 3 % (ref 0–5)
Lymphs Abs: 2.3 10*3/uL (ref 0.7–4.0)
Monocytes Absolute: 0.5 10*3/uL (ref 0.1–1.0)
Monocytes Relative: 4 % (ref 3–12)

## 2010-08-09 LAB — URINALYSIS, ROUTINE W REFLEX MICROSCOPIC
Bilirubin Urine: NEGATIVE
Hgb urine dipstick: NEGATIVE
Nitrite: NEGATIVE
Specific Gravity, Urine: 1.015 (ref 1.005–1.030)
pH: 6 (ref 5.0–8.0)

## 2010-08-09 LAB — RAPID STREP SCREEN (MED CTR MEBANE ONLY): Streptococcus, Group A Screen (Direct): NEGATIVE

## 2010-08-09 LAB — LIPASE, BLOOD: Lipase: 22 U/L (ref 11–59)

## 2010-08-09 LAB — POCT PREGNANCY, URINE: Preg Test, Ur: NEGATIVE

## 2010-08-09 LAB — CBC
MCHC: 34.5 g/dL (ref 30.0–36.0)
RBC: 4.56 MIL/uL (ref 3.87–5.11)
RDW: 12.4 % (ref 11.5–15.5)

## 2010-08-09 LAB — POCT CARDIAC MARKERS
CKMB, poc: <1 ng/mL — ABNORMAL LOW (ref 1.0–8.0)
Myoglobin, poc: 62.2 ng/mL (ref 12–200)

## 2010-08-19 NOTE — Letter (Signed)
[facility]   Imported By: Arta Bruce 08/12/2010 11:28:30  _____________________________________________________________________  External Attachment:    Type:   Image     Comment:   External Document

## 2010-08-25 LAB — DIFFERENTIAL
Basophils Absolute: 0 10*3/uL (ref 0.0–0.1)
Basophils Relative: 0 % (ref 0–1)
Eosinophils Absolute: 0.4 10*3/uL (ref 0.0–0.7)
Eosinophils Relative: 4 % (ref 0–5)
Monocytes Absolute: 0.6 10*3/uL (ref 0.1–1.0)

## 2010-08-25 LAB — CBC
HCT: 29 % — ABNORMAL LOW (ref 36.0–46.0)
Hemoglobin: 9.1 g/dL — ABNORMAL LOW (ref 12.0–15.0)
MCHC: 31.6 g/dL (ref 30.0–36.0)
MCV: 73.8 fL — ABNORMAL LOW (ref 78.0–100.0)
RDW: 16.2 % — ABNORMAL HIGH (ref 11.5–15.5)

## 2010-08-25 LAB — URINE MICROSCOPIC-ADD ON

## 2010-08-25 LAB — BASIC METABOLIC PANEL
CO2: 24 mEq/L (ref 19–32)
Calcium: 9 mg/dL (ref 8.4–10.5)
Chloride: 107 mEq/L (ref 96–112)
Glucose, Bld: 104 mg/dL — ABNORMAL HIGH (ref 70–99)
Potassium: 3.5 mEq/L (ref 3.5–5.1)
Sodium: 138 mEq/L (ref 135–145)

## 2010-08-25 LAB — URINALYSIS, ROUTINE W REFLEX MICROSCOPIC
Hgb urine dipstick: NEGATIVE
Protein, ur: NEGATIVE mg/dL
Urobilinogen, UA: 0.2 mg/dL (ref 0.0–1.0)

## 2010-08-26 LAB — WET PREP, GENITAL
Clue Cells Wet Prep HPF POC: NONE SEEN
Trich, Wet Prep: NONE SEEN

## 2010-09-02 LAB — DIFFERENTIAL
Basophils Absolute: 0.1 10*3/uL (ref 0.0–0.1)
Basophils Relative: 0 % (ref 0–1)
Basophils Relative: 1 % (ref 0–1)
Eosinophils Absolute: 0.4 K/uL (ref 0.0–0.7)
Eosinophils Relative: 0 % (ref 0–5)
Eosinophils Relative: 4 % (ref 0–5)
Lymphocytes Relative: 17 % (ref 12–46)
Lymphocytes Relative: 6 % — ABNORMAL LOW (ref 12–46)
Lymphs Abs: 1.8 10*3/uL (ref 0.7–4.0)
Monocytes Absolute: 0 10*3/uL — ABNORMAL LOW (ref 0.1–1.0)
Monocytes Absolute: 0.5 K/uL (ref 0.1–1.0)
Monocytes Relative: 4 % (ref 3–12)
Neutro Abs: 10.8 10*3/uL — ABNORMAL HIGH (ref 1.7–7.7)
Neutro Abs: 7.9 10*3/uL — ABNORMAL HIGH (ref 1.7–7.7)
Neutrophils Relative %: 74 % (ref 43–77)
Neutrophils Relative %: 94 % — ABNORMAL HIGH (ref 43–77)

## 2010-09-02 LAB — BASIC METABOLIC PANEL
BUN: 6 mg/dL (ref 6–23)
CO2: 20 mEq/L (ref 19–32)
Calcium: 8.6 mg/dL (ref 8.4–10.5)
Calcium: 9 mg/dL (ref 8.4–10.5)
Chloride: 109 mEq/L (ref 96–112)
Creatinine, Ser: 0.53 mg/dL (ref 0.4–1.2)
Creatinine, Ser: 0.67 mg/dL (ref 0.4–1.2)
GFR calc Af Amer: >60 mL/min (ref >60)
GFR calc Af Amer: >60 mL/min (ref >60)
GFR calc non Af Amer: >60 mL/min (ref >60)
Glucose, Bld: 245 mg/dL — ABNORMAL HIGH (ref 70–99)
Potassium: 3.5 mEq/L (ref 3.5–5.1)
Sodium: 140 mEq/L (ref 135–145)

## 2010-09-02 LAB — CBC
HCT: 40.7 % (ref 36.0–46.0)
Hemoglobin: 13.1 g/dL (ref 12.0–15.0)
Hemoglobin: 13.8 g/dL (ref 12.0–15.0)
MCHC: 34 g/dL (ref 30.0–36.0)
MCV: 92.6 fL (ref 78.0–100.0)
MCV: 92.9 fL (ref 78.0–100.0)
Platelets: 220 10*3/uL (ref 150–400)
RBC: 4.15 MIL/uL (ref 3.87–5.11)
RBC: 4.3 MIL/uL (ref 3.87–5.11)
RBC: 4.39 MIL/uL (ref 3.87–5.11)
RDW: 12.5 % (ref 11.5–15.5)
WBC: 10.6 10*3/uL — ABNORMAL HIGH (ref 4.0–10.5)
WBC: 11.5 10*3/uL — ABNORMAL HIGH (ref 4.0–10.5)
WBC: 15 10*3/uL — ABNORMAL HIGH (ref 4.0–10.5)

## 2010-09-02 LAB — BASIC METABOLIC PANEL WITH GFR
CO2: 25 meq/L (ref 19–32)
Chloride: 109 meq/L (ref 96–112)
Creatinine, Ser: 0.55 mg/dL (ref 0.4–1.2)
GFR calc Af Amer: >60 mL/min (ref >60)
Glucose, Bld: 118 mg/dL — ABNORMAL HIGH (ref 70–99)

## 2010-09-02 LAB — D-DIMER, QUANTITATIVE: D-Dimer, Quant: 0.35 ug/mL-FEU (ref 0.00–0.48)

## 2010-09-02 LAB — POCT CARDIAC MARKERS
CKMB, poc: <1 ng/mL — ABNORMAL LOW (ref 1.0–8.0)
Myoglobin, poc: 44 ng/mL (ref 12–200)
Troponin i, poc: <0.05 ng/mL (ref 0.00–0.09)

## 2010-09-02 LAB — CARDIAC PANEL(CRET KIN+CKTOT+MB+TROPI)
CK, MB: 1.1 ng/mL (ref 0.3–4.0)
Relative Index: INVALID (ref 0.0–2.5)
Relative Index: INVALID (ref 0.0–2.5)
Troponin I: 0.02 ng/mL (ref 0.00–0.06)

## 2010-09-02 LAB — CK TOTAL AND CKMB (NOT AT ARMC)
CK, MB: 1.1 ng/mL (ref 0.3–4.0)
Relative Index: INVALID (ref 0.0–2.5)
Total CK: 68 U/L (ref 7–177)

## 2010-09-06 LAB — POCT PREGNANCY, URINE: Preg Test, Ur: NEGATIVE

## 2010-10-01 ENCOUNTER — Inpatient Hospital Stay (HOSPITAL_COMMUNITY)
Admission: AD | Admit: 2010-10-01 | Discharge: 2010-10-01 | Disposition: A | Discharge status: Home / Self Care | Payer: Self-pay | Source: Ambulatory Visit | Attending: Obstetrics and Gynecology | Admitting: Obstetrics and Gynecology

## 2010-10-01 ENCOUNTER — Inpatient Hospital Stay (HOSPITAL_COMMUNITY): Payer: Self-pay

## 2010-10-01 DIAGNOSIS — J45909 Unspecified asthma, uncomplicated: Secondary | ICD-10-CM | POA: Insufficient documentation

## 2010-10-01 DIAGNOSIS — O99891 Other specified diseases and conditions complicating pregnancy: Secondary | ICD-10-CM | POA: Insufficient documentation

## 2010-10-01 DIAGNOSIS — O9989 Other specified diseases and conditions complicating pregnancy, childbirth and the puerperium: Secondary | ICD-10-CM

## 2010-10-05 NOTE — Discharge Summary (Signed)
Meghan Mclean, Meghan Mclean      ACCOUNT NO.:  [id number]   MEDICAL RECORD NO.:  [id number]          PATIENT TYPE:  INP   LOCATION:  1401                         FACILITY:  [facility]   PHYSICIAN:  Theodosia Paling, MD    DATE OF BIRTH:  1976/02/06   DATE OF ADMISSION:  08/08/2008  DATE OF DISCHARGE:  08/10/2008                               DISCHARGE SUMMARY   PRIMARY CARE PHYSICIAN:  [facility].   ADMITTING HISTORY:  Please refer to the excellent admission note  dictatedby Dr. Della Goo under history of present illness.   1. Chest pain with negative cardiac enzyme and negative CT and most      likely due to asthma exacerbation as it was getting better with      nebs.  2. History of asthma.   DISCHARGE MEDICATIONS:  Patient is to continue home medication which  include the following:  1. Proventil 2 puffs p.o. every 8 hours p.r.n.  2. Advair Diskus 1 puff every 12 hours 50/250.  3. Singulair 10 mg p.o. at bedtime.   NEW MEDICATION ADDED:  1. Albuterol 2.5 mg nebs every 4 to 6 hours p.r.n. for a month.  2. Azithromycin 250 mg p.o. daily for 3 days.   HOSPITAL COURSE:  The following issues were addressed during the  hospitalization:  1. Chest pain.  Patient was admitted in tele.  There was no EKG      finding.  Cardiac enzymes were negative.  CT was negative for PE.      Her chest pain was improving with nebulizer treatment.  Most likely      it was secondary to asthma exacerbation.  She received nebulizer.      At the time of discharge, patient is chest pain free.  2. Asthma exacerbation.  Patient received nebulizer.  Her oxygen      saturation at rest and on ambulation at room air is above 96%.  She      is not tachypneic.  Her vitals, she is hemodynamically stable.   CONSULTATION PERFORMED:  None.   PROCEDURE PERFORMED:  None.   IMAGING PERFORMED:  CTAshowing volume loss at the right base which in  part could be decompressive due to fatty infiltration of the  liver,  asymmetric increased marking with small right pleural effusion, early  infiltrate versus atelectasis.  Chest x-ray done on August 08, 2008,  showing no acute cardiopulmonary process.   DISPOSITION:  Patient will be following up With [facility]  in 1 week's time.  The patient will be calling up for  appointment.Marland Kitchen   TOTAL TIME SPENT IN DISCHARGE OF THE PATIENT:  Around 45 minutes.      Theodosia Paling, MD  Electronically Signed     NP/MEDQ  D:  08/10/2008  T:  08/10/2008  Job:  [id number]

## 2010-10-05 NOTE — Discharge Summary (Signed)
Meghan Mclean, Meghan Mclean     ACCOUNT NO.:  [id number]   MEDICAL RECORD NO.:  [id number]          PATIENT TYPE:  INP   LOCATION:  9154                          FACILITY:  [facility]   PHYSICIAN:  Meghan Blizzard, MD    DATE OF BIRTH:  Dec 22, 1975   DATE OF ADMISSION:  09/19/2007  DATE OF DISCHARGE:  09/21/2007                               DISCHARGE SUMMARY   ADMISSION DIAGNOSES:  1. Intrauterine pregnancy at 33 weeks 1 day gestation.  2. Community-acquired pneumonia.   POSTOPERATIVE DIAGNOSES:  1. Intrauterine pregnancy at 33 weeks 3 day gestation.  2. Community-acquired pneumonia.   PROCEDURES:  1. The patient had an admission chest x-ray on September 19, 2007, showing      prominence of the interstitium with question of interstitial lung      disease versus atypical pulmonary infection.  2. The patient had a repeat chest x-ray on hospital day #2, showing      area of focal density at the right lung base with question of      atelectasis versus early focus of bronchial pneumonia.   COMPLICATIONS:  None.   CONSULTATIONS:  None.   PERTINENT LABORATORY FINDINGS:  Upon admission, the patient had a  complete blood count showing white blood cell count 10.6, hemoglobin  12.7, hematocrit 37.1, platelets 119, neutrophils 61, and lymphocytes  21.  At present, influenza A and B and rapid screening were negative.   BRIEF PERTINENT ADMISSION HISTORY:  Meghan Mclean is a 35-  year-old gravida 7, para 5-0-1-5 at 33 weeks 1 day gestation presenting  with symptoms of dyspnea and cough for the past 3 weeks.  Her cough has  been productive.  She has had 2 previous MAU visits in which she was  discharged home with albuterol and Pulmicort inhalers.  She was given  presumptive diagnosis of upper respiratory illness and reactive airway  disease.  She returns with similar complaints.  Oxygen saturation on  room air is 92%.  The patient was admitted due to continued symptoms and  chest  x-ray showing possibly atypical pneumonia.  The patient was  admitted with antibiotic therapy with Rocephin and Zithromax.   HOSPITAL COURSE:  The patient was admitted.  She is afebrile throughout  her stay including her MAU admission vitals.  She continued to have the  productive cough.  She was placed in respiratory isolation and  antibiotics were continued with Rocephin and Zithromax throughout her  stay.  She received breathing treatments with albuterol 2.5 mg nebulizer  treatment every 6 hours.  On hospital day #3, she stated that her  symptoms have improved considerably.  On physical examination, she  continued to have some respiratory wheezing in the mid and lower right  lung with rhonchi more prominent on the right, but present in the left  lung as well.  She was saturating 92%-96% on room air.  She remained  afebrile and showed no signs of worsening infection.  She was questioned  about environmental exposures including asbestosis, exposure to organic  and inorganic cleaning materials.  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  She has  no other environmental  exposures.  She has no sequelae of autoimmune diseases.  She is ready  for discharge.   DISCHARGE MEDICATIONS:  1. Continue prenatal vitamins as previously.  2. Albuterol 90 mcg metered dose inhaler 1 to 2 puffs every 4-6 hours      as needed for shortness of breath and cough.  3. Zithromax 500 mg 1 tablet by mouth everyday for 3 days.   DISCHARGE INSTRUCTIONS:  1. Discharge to home.  2. Regular diet.  3. Activity as tolerated.  4. Followup with [facility].  The patient is to call and      arrange for that appointment.      Meghan Lemon, MD      Meghan Blizzard, MD  Electronically Signed    NS/MEDQ  D:  09/21/2007  T:  09/22/2007  Job:  [id number]

## 2010-10-05 NOTE — H&P (Signed)
Meghan Mclean, Meghan Mclean      ACCOUNT NO.:  [id number]   MEDICAL RECORD NO.:  [id number]          PATIENT TYPE:  INP   LOCATION:  1401                         FACILITY:  [facility]   PHYSICIAN:  Della Goo, M.D. DATE OF BIRTH:  1975-12-04   DATE OF ADMISSION:  08/08/2008  DATE OF DISCHARGE:                              HISTORY & PHYSICAL   DATE OF ADMISSION:  August 08, 2008.   CHIEF COMPLAINT:  Shortness of breath, chest tightness.   HISTORY OF PRESENT ILLNESS:  This is 35 year old female who is Spanish-  speaking only, who presents to the emergency department with complaints  of chest tightness, shortness of breath and presyncope.  She states her  symptoms began at about 6 p.m. and continued until she was given  medication in the emergency department which included sublingual nitro  and aspirin.  She denies having any previous similar episodes.  She  describes pain in the center of her chest. Denies any radiation of this  pain.  She does have a history of asthma.  She reports beginning to also  have cough and sneezing which started while in the emergency department.  The patient also has associated symptoms of nausea, but no vomiting.   The patient was evaluated in the emergency department by the EDP and  cardiac enzymes point of care markers were sent which were found to be  negative.  However, an EKG was performed which revealed T-wave inversion  in the anterior leads.  This interview was conducted with the assistance  of the telephone interpretation line.   PAST MEDICAL HISTORY:  Significant for asthma.   PAST SURGICAL HISTORY:  None.   MEDICATIONS:  1. Proventil.  2. Advair Diskus.  3. Tessalon Perles with Singulair.   ALLERGIES:  NO KNOWN DRUG ALLERGIES.   SOCIAL HISTORY:  The patient is married with 6 children.  She is a  nonsmoker, nondrinker.   FAMILY HISTORY:  Negative for coronary artery disease, diabetes,  hypertension and cancer.   PHYSICAL  EXAMINATION:  GENERAL:  This is a 35 year old well-nourished,  well-developed female in discomfort, but no acute distress.  VITAL SIGNS:  Temperature 98.7, blood pressure 112/72, heart rate 78,  respirations 20, O2 sat is 97-99%.  HEENT:  Normocephalic, atraumatic.  Pupils are equally round and  reactive to light.  Extraocular movements are intact.  Funduscopic  benign.  Oropharynx clear.  NECK:  Supple, full range of motion.  No thyromegaly, adenopathy,  jugular venous distention.  CARDIOVASCULAR:  Regular rate and rhythm.  No murmurs, gallops or rubs.  LUNGS:  Clear to auscultation, but mildly decreased.  No rales, rhonchi  or wheezes appreciated.  ABDOMEN:  Positive bowel sounds, soft, nontender, nondistended.  EXTREMITIES:  Without cyanosis, clubbing or edema.  NEUROLOGIC:  Nonfocal.  The patient is alert and oriented x3.  Cranial  nerves intact.  Motor and sensory function intact.   LABORATORY STUDIES:  White blood cell count 10.6, hemoglobin 13.8,  hematocrit 40.7, platelets 220, neutrophils 74%, lymphocytes 17%, sodium  140, potassium 3.5, chloride 109, carbon dioxide 25, BUN 6, creatinine  0.55, glucose 118.  Point care cardiac markers:  Myoglobin 42.2,  CK-MB  less than 1.0, troponin less than 0.05.   DIAGNOSTICS:  1. Chest x-ray reveals no acute cardiopulmonary process.  2. EKG findings as mentioned above.   ASSESSMENT:  A 35 year old female being admitted with:  1. Chest pain.  2. Asthma exacerbation.  3. Beginning upper respiratory tract infection.   PLAN:  The patient will be admitted to a telemetry area for cardiac  monitoring.  Cardiac enzymes will be performed.  The patient will be  placed on Nitropaste, oxygen and aspirin therapy.  She will be placed on  DVT and GI prophylaxis for now.  Azithromycin therapy has been ordered  for her upper respiratory infection.  She has also been placed on a  steroid taper and nebulizer treatments with albuterol and Atrovent   therapy.  Further workup will ensue pending results of her condition and  results of her studies.      Della Goo, M.D.  Electronically Signed     HJ/MEDQ  D:  08/09/2008  T:  08/09/2008  Job:  [id number]

## 2010-10-08 NOTE — Group Therapy Note (Signed)
Meghan Mclean, Meghan Mclean     ACCOUNT NO.:  [id number]   MEDICAL RECORD NO.:  [id number]          PATIENT TYPE:  WOC   LOCATION:  [facility]                   FACILITY:  [facility]   PHYSICIAN:  Argentina Donovan, MD        DATE OF BIRTH:  08-01-1975   DATE OF SERVICE:                                    CLINIC NOTE   The patient is a 35 year old gravida 6, para 5-0-1-5 who had what appeared  to be a complete abortion 3 weeks ago when she came into the MAU.  She has  had no bleeding since that time. The last 6-7 days she has developed some  lower pelvic pain radiating into the back.   ON EXAMINATION:  Her abdomen is soft, flat, tender to deep palpation in the  suprapubic area, but no rebound or guarding.  External genitalia is normal.  BUS is within normal limits.  Vagina is clean and well rugated.  Cervix is  clean, parous.  Pap smear was taken.  The uterus and adnexa could not be  well outlined cause of habitus of the patient but there was not significant  amount tenderness on examination.  We discussed the future with the patient,  she wants an IUD.  She get pregnant while on the pill.  She had missed  several pills.  We are going to put in a request for a Mirena IUD after  discussing the options with her, and my impression is that she is probably  ovulating and has followed with causing the discomfort.  We have cautioned  her to use condoms until the IUD okay comes in to [place], and in addition to  that were going to draw a quantitative beta to make sure she had a complete  AB.  If by  chance the beta comes back positive we may get a repeat  ultrasound.   IMPRESSION:  Complete abortion.   PLAN:  Mirena IUD.  Dr. Argentina Donovan           ______________________________  Argentina Donovan, MD     PR/MEDQ  D:  02/08/2006  T:  02/09/2006  Job:  [id number]

## 2010-11-27 LAB — GC/CHLAMYDIA PROBE AMP, GENITAL
Chlamydia: NEGATIVE
Gonorrhea: NEGATIVE

## 2010-12-06 LAB — VARICELLA ZOSTER ANTIBODY, IGG: Varicella: IMMUNE

## 2010-12-06 LAB — RPR: RPR: NONREACTIVE

## 2010-12-06 LAB — ANTIBODY SCREEN: Antibody Screen: NEGATIVE

## 2010-12-06 LAB — CYTOLOGY - PAP
Pap: NEGATIVE
Urine Culture, OB: NEGATIVE

## 2010-12-06 LAB — HIV ANTIBODY (ROUTINE TESTING W REFLEX): HIV: NONREACTIVE

## 2011-01-27 DIAGNOSIS — J45909 Unspecified asthma, uncomplicated: Secondary | ICD-10-CM | POA: Insufficient documentation

## 2011-02-03 ENCOUNTER — Other Ambulatory Visit: Payer: Self-pay | Admitting: Family Medicine

## 2011-02-03 ENCOUNTER — Ambulatory Visit (INDEPENDENT_AMBULATORY_CARE_PROVIDER_SITE_OTHER): Payer: Self-pay | Admitting: Family Medicine

## 2011-02-03 DIAGNOSIS — G5600 Carpal tunnel syndrome, unspecified upper limb: Secondary | ICD-10-CM | POA: Insufficient documentation

## 2011-02-03 DIAGNOSIS — J45909 Unspecified asthma, uncomplicated: Secondary | ICD-10-CM

## 2011-02-03 DIAGNOSIS — Z641 Problems related to multiparity: Secondary | ICD-10-CM | POA: Insufficient documentation

## 2011-02-03 LAB — POCT URINALYSIS DIP (DEVICE)
Ketones, ur: NEGATIVE mg/dL
Leukocytes, UA: NEGATIVE
Nitrite: NEGATIVE
Protein, ur: NEGATIVE mg/dL
pH: 7 (ref 5.0–8.0)

## 2011-02-03 MED ORDER — WRIST BRACE/LEFT MEDIUM MISC: 1.0000 | Freq: Every day | Status: DC

## 2011-02-03 MED ORDER — FLUTICASONE-SALMETEROL 100-50 MCG/DOSE IN AEPB: 1.0000 | INHALATION_SPRAY | Freq: Two times a day (BID) | RESPIRATORY_TRACT | Status: DC

## 2011-02-03 MED ORDER — FLINTSTONES COMPLETE 60 MG PO CHEW: 2.0000 | CHEWABLE_TABLET | Freq: Every day | ORAL | Status: DC

## 2011-02-03 NOTE — Patient Instructions (Addendum)
Asma - Adultos (Asthma, Adult) El asma es una enfermedad de los pulmones que puede dificultar la respiracin. El asma no puede curarse pero hay medicamentos que pueden controlarla. Los ataques pueden dispararse por:  Polen.  Polvo.   Descamacin de la piel de los [place].   Moho.   Alimentos.   Infecciones respiratorias (resfros, etc.).   Humo.  Actividad fsica.   Estrs.   Otras sustancias que causen Therapist, art o Environmental consultant (alrgenos).   CUIDADOS EN EL HOGAR  Converse con el profesional que lo asiste acerca de cmo puede controlar los ataques en Advice worker. Esto puede incluir:   La utilizacin de una herramienta denominada medidor de flujo mximo.   Tener medicamentos disponibles para [place] representative.   Tome todos los medicamentos tal como se los prescribi el mdico.   Lave las sbanas y las mantas todas las semanas con agua caliente y squelas con aire caliente.   Beba gran cantidad de lquido para mantener la orina de tono claro o color amarillo plido.   Est siempre listo para recibir Risk manager. Anote el nmero telefnico del mdico. Colquelo en un lugar en donde pueda encontrarlo facilmente.   Converse con el mdico acerca de su actividad fsica de [place].   Si las plumas o el pelo de un animal son el motivo del asma, deber deshacerse de las mascotas.  SOLICITE AYUDA SI:  Siente dolores musculares.   Tiene ms tos.   Usted tiene una temperatura oral de ms de 101.  SOLICITE AYUDA DE INMEDIATO SI:  Usted tiene dolor en el pecho.   Su esputo cambia a un color amarillo, verde, gris o sanguinolento.   Los medicamentos no ayudan a Pensions consultant ruidos respiratorios.   Presenta dificultades respiratorias.   Usted tiene una temperatura oral de ms de 101 y no puede controlarla con medicamentos.   El medicamento le causa:   Una erupcin.   Picazn.   Hinchazn.   Problemas respiratorios.  ASEGRESE QUE:   Comprende estas  instrucciones.   Controlar su enfermedad.   Solicitar ayuda inmediatamente si no mejora o si empeora.  Document Released: 08/05/2008 Document Re-Released: 10/27/2009 [facility] Patient Information 2011 [place], [place].Sndrome del tnel carpiano (Carpal Tunnel Syndrome) Es posible que usted sufra el sndrome del tnel carpiano. Se trata de un problema muy frecuente. Este trastorno se produce [employer] tendones, los [place] o los ligamentos de la mueca comprimen el nervio mediano en su trayectoria hacia la [place].  Los sntomas pueden incluir:  Adormecimiento intermitente.   Dolor o sensacin de Nurse, learning disability y en los dos primeros dedos.  El dolor puede irradiarse hacia el hombro. Tambin puede haber debilidad en los msculos de la [place]. Generalmente el dolor empeora por la noche y en las primeras horas de la [place]. Para probar el diagnstico se realizan pruebas de conduccin nerviosa. El sndrome del tnel carpiano generalmente se debe a movimientos repetitivos realizados con la mano o la [place]. Entre otras causas se incluyen:  Lesiones previas  Diabetes   Obesidad  El consumo de cigarrillos.   El [place]. Los sntomas que se desarrollan en el embarazo con frecuencia mejoran al finalizar el mismo.   El tratamiento consiste en:  Entablillado - Se entablilla la mueca para evitar los movimientos que irritan el nervio. Las tablillas son de especial ayuda por la noche, cuando los sntomas [place].   Bolsas de hielo - Las bolsas fras aplicadas en la [place], del lado de la palma durante  20 minutos cada 2 horas, pueden proporcionar alivio.   Medicamentos - Generalmente se utilizan medicamentos para disminuir la inflamacin y Chief Technology Officer. Las inyecciones de corticoides en la zona del nervio tambin brindan mejora.  Los [place]orne graves de sndrome de tnel carpiano pueden requerir ciruga para Paramedic la presin sobre el nervio. Es necesaria si hay debilidad o disminucin de la  sensibilidad en la mano, o si los sntomas no mejoran con Artist. Comunquese con el profesional que lo asiste para Surveyor, mining una visita de control y verificar que el problema mejora. Document Released: 05/09/2005 Document Re-Released: 03/06/2007 [facility] Patient Information 2011 [place], [place]. Embarazo - Segundo trimestre (Pregnancy - Second Trimester) El segundo trimestre del Psychiatrist (del 3 al ) es un perodo de evolucin rpida para usted y el beb. Hacia el final del sexto mes, el beb mide aproximadamente 23 cm y pesa 680 g. Comenzar a Pharmacologist del beb [place] 18 y las 20 [address] de [place]. Podr sentir las pataditas ("quickening en ingls"). Hay un rpido [employer]way. Puede segregar un lquido claro Charity fundraiser) de las [place]. Quizs sienta pequeas contracciones en el vientre (tero) Esto se conoce como falso trabajo de parto o contracciones de [place]. Es como una prctica del trabajo de parto que se produce cuando el beb est listo para salir. Generalmente los problemas de vmitos matinales ya se han superado hacia el final del Medical sales representative. Algunas mujeres desarrollan pequeas manchas oscuras (que se denominan cloasma, mscara del embarazo) en la cara que normalmente se van luego del nacimiento del beb. La exposicin al sol empeora las manchas. Puede desarrollarse acn en algunas mujeres embarazadas, y puede desaparecer en aquellas que ya tienen acn. EXAMENES PRENATALES  Durante los Manpower Inc, deber seguir realizando pruebas de [place], segn avance el [place]. Estas pruebas se realizan para controlar su salud y la del beb. Tambin se realizan anlisis de sangre para [employer] niveles de [place]. La anemia (bajo nivel de hemoglobina) es frecuente durante el embarazo. Para prevenirla, se administran hierro y vitaminas. Tambin se le realizarn exmenes para saber si tiene diabetes entre las 24 y las 28 semanas del  [place]. Podrn repetirle algunas de las Hovnanian Enterprises hicieron previamente.   En cada visita le medirn el tamao del tero. Esto se realiza para asegurarse de que el beb est creciendo correctamente de acuerdo al estado del [place].   Tambin en cada visita prenatal controlarn su presin arterial. Esto se realiza para asegurarse de que no tenga toxemia.   Se controlar su orina para asegurarse de que no tenga infecciones, diabetes o protena en la orina.   Se controlar su peso regularmente para asegurarse que el aumento ocurre al ritmo indicado. Esto se hace para asegurarse que usted y el beb tienen una evolucin normal.   En algunas ocasiones se realiza una prueba de ultrasonido para confirmar el correcto desarrollo y evolucin del beb. Esta prueba se realiza con ondas sonoras inofensivas para el beb, de modo que el profesional pueda calcular ms precisamente la fecha del [place].  Algunas veces se realizan pruebas especializadas del lquido amnitico que rodea al beb. Esta prueba se denomina amniocentesis. El lquido amnitico se obtiene introduciendo una aguja en el vientre (abdomen). Se realiza para Conservator, museum/gallery en los que existe alguna preocupacin acerca de algn problema gentico que pueda sufrir el beb. En ocasiones se lleva a cabo cerca del final del embarazo, si es necesario inducir al Apple Computer. En este  caso se realiza para asegurarse que los pulmones del beb estn lo suficientemente maduros como para que pueda vivir fuera del tero. CAMBIOS QUE OCURREN EN EL SEGUNDO TRIMESTRE DEL EMBARAZO Su organismo atravesar numerosos cambios durante el Big Lots. Estos pueden variar de Neomia Dear persona a otra. Converse con el profesional que la asiste acerca los cambios que usted note y que la preocupen.  Durante el segundo trimestre probablemente sienta un aumento del apetito. Es normal tener "antojos" de Development worker, community. Esto vara de Neomia Dear persona a otra y de un  embarazo a Therapist, art.   El abdomen inferior comenzar a abultarse.   Podr tener la necesidad de Geographical information systems officer con ms frecuencia debido a que el tero y el beb presionan sobre la vejiga. Tambin es frecuente contraer ms infecciones urinarias durante el embarazo (dolor al ConocoPhillips). Puede evitarlas bebiendo gran cantidad de lquidos y vaciando la vejiga antes y despus de Sales promotion account executive.   Podrn aparecer las primeras estras en las caderas, abdomen y [place]. Estos son cambios normales del cuerpo durante el [place]. No existen medicamentos ni ejercicios que puedan prevenir CarMax.   Es posible que comience a desarrollar venas inflamadas y abultadas (varices) en las piernas. El uso de medias de descanso, Optometrist sus pies durante 15 minutos, 3 a 4 veces al da y Film/video editor la sal en su dieta ayuda a Journalist, newspaper.   Podr sentir Engineering geologist gstrica a medida que el tero crece y Doctor, general practice. Puede tomar anticidos, con la autorizacin de su mdico, para Financial planner. Tambin es til ingerir pequeas comidas 4 a 5 veces al Futures trader.   La constipacin puede tratarse con un laxante o agregando fibra a su dieta. Beber grandes cantidades de lquidos, comer vegetales, frutas y granos integrales es de [place].   Tambin es beneficioso practicar actividad fsica. Si ha sido una persona Engineer, mining, podr continuar con la Harley-Davidson de las actividades durante el mismo. Si ha sido American Family Insurance, puede ser beneficioso que comience con un programa de ejercicios, Museum/gallery exhibitions officer.   Puede desarrollar hemorroides (vrices en el recto) hacia el final del segundo trimestre. Tomar baos de asiento tibios y Chemical engineer cremas recomendadas por el profesional que lo asiste sern de ayuda para los problemas de hemorroides.   Tambin podr Financial risk analyst de espalda durante este momento de su embarazo. Evite levantar objetos pesados, utilice zapatos de taco bajo y [place] buena  postura para ayudar a reducir los problemas de [place].   Algunas mujeres embarazadas desarrollan hormigueo y adormecimiento de la mano y los dedos debido a la hinchazn y compresin de los ligamentos de la mueca (sndrome del tnel carpiano). Esto desaparece una vez que el beb nace.   Como sus pechos se agrandan, Pension scheme manager un sujetador ms grande. Use un sostn de soporte, cmodo y de algodn. No utilice un sostn para amamantar hasta el ltimo mes de embarazo si va a amamantar al beb.   Podr observar una lnea oscura desde el ombligo hacia la zona pbica denominada linea nigra.   Podr observar que sus mejillas se ponen coloradas debido al aumento de flujo sanguneo en la cara.   Podr desarrollar "araitas" en la cara, cuello y pecho. Esto desaparece una vez que el beb nace.  INSTRUCCIONES PARA EL CUIDADO DOMICILIARIO  Es extremadamente importante que evite el cigarrillo, hierbas medicinales, alcohol y las drogas no prescriptas durante el Psychiatrist. Estas sustancias qumicas afectan la formacin y el desarrollo del beb. Evite estas  sustancias durante todo el embarazo para asegurar el nacimiento de un beb sano.   La mayor parte de los cuidados que se aconsejan son los mismos que los indicados para Financial risk analyst trimestre del Psychiatrist. Cumpla con las citas tal como se le indic. Siga las instrucciones del profesional que lo asiste con respecto al uso de los medicamentos, el ejercicio y Psychologist, forensic.   Durante el embarazo debe obtener nutrientes para usted y para su beb. Consuma alimentos balanceados a intervalos regulares. Elija alimentos como carne, pescado, [place] y otros productos lcteos descremados, vegetales, frutas, panes integrales y cereales. El Equities trader cul es el aumento de peso ideal.   Las relaciones sexuales fsicas pueden continuarse hasta cerca del fin del embarazo si no existen otros problemas. Estos problemas pueden ser la prdida temprana (prematura) de lquido  amnitico de las [place], sangrado vaginal, dolor abdominal u otros problemas mdicos o del Psychiatrist.   Realice Tesoro Corporation, si no tiene restricciones. Consulte con el profesional que la asiste si no sabe con certeza si determinados ejercicios son seguros. El mayor aumento de peso tiene Environmental consultant durante los ltimos 2 trimestres del Psychiatrist. El ejercicio la ayudar a:   Engineering geologist.   Ponerla en forma para el parto.   Ayudarla a perder peso luego de haber dado a luz.   Use un buen sostn o como los que se usan para hacer deportes para Paramedic la sensibilidad de las [place]. Tambin puede serle til si lo [place] mientras duerme. Si pierde Product manager, podr Parker Hann[employer]in.   No utilice la baera con agua caliente, baos turcos y saunas durante el [address]   Utilice el cinturn de seguridad sin excepcin cuando conduzca. Este la proteger a usted y al beb en caso de accidente.   Evite comer carne cruda, queso crudo, y el contacto con los utensilios y desperdicios de los gatos. Estos elementos contienen grmenes que pueden causar defectos de nacimiento en el beb.   El segundo trimestre es un buen momento para visitar a su dentista y Software engineer si an no lo ha hecho. Es Primary school teacher los dientes limpios. Utilice un cepillo de dientes blando. Cepllese ms suavemente durante el embarazo.   Es ms fcil perder algo de orina durante el [place]. Apretar y Chief Operating Officer los msculos de la pelvis la ayudar con este problema. Practique detener la miccin cuando est en el bao. Estos son los mismos msculos que Development worker, international aid. Son [employer] Partners mismos msculos que utiliza cuando trata de Ryder System gases. Puede practicar apretando estos msculos 10 veces, y repetir esto 3 veces por da aproximadamente. Una vez que conozca qu msculos debe apretar, no realice estos ejercicios durante la miccin. Puede favorecerle una infeccin si la orina vuelve hacia  atrs.   Pida ayuda si tiene necesidades econmicas, de asesoramiento o nutricionales durante el [place]. El profesional podr ayudarla con respecto a estas necesidades, o derivarla a otros especialistas.   La piel puede ponerse grasa. Si esto sucede, lvese la cara con un jabn [place], utilice un humectante no graso y Lydia con base de aceite o crema.  CONSUMO DE MEDICAMENTOS Y DROGAS DURANTE EL EMBARAZO  Contine tomando las vitaminas apropiadas para esta etapa tal como se le indic. Las vitaminas deben contener un miligramo de cido flico y deben suplementarse con hierro. Guarde todas las vitaminas fuera del alcance de los nios. La ingestin de slo un par de vitaminas o tabletas que contengan hierro  puede ocasionar la muerte en un beb o en un nio pequeo.   Evite el uso de [place], inclusive los de venta libre y hierbas que no hayan sido prescriptos o indicados por el profesional que la asiste. Algunos medicamentos pueden causar problemas fsicos al beb. Utilice los medicamentos de venta libre o de prescripcin para Chief Technology Officer, Environmental health practitioner o la [place], segn se lo indique el profesional que lo asiste. No utilice aspirina.   El consumo de alcohol est relacionado con ciertos defectos de nacimiento. Esto incluye el sndrome de alcoholismo fetal. Debe evitar el consumo de alcohol en cualquiera de sus formas. El cigarrillo causa nacimientos prematuros y bebs de [place]. El uso de drogas recreativas est absolutamente prohibido. Son muy nocivas para el beb. Un beb que nace de American Express, ser adicto al nacer. Ese beb tendr los mismos sntomas de abstinencia que un adulto.   Infrmele al profesional si consume alguna droga.   No consuma drogas ilegales. Pueden causarle mucho dao al beb.  SOLICITE ATENCIN MDICA SI:  Tiene preguntas o preocupaciones durante su embarazo. Es mejor que llame para Science writer las dudas que esperar hasta su prxima visita prenatal. Thressa Sheller forma  se sentir ms tranquila.  SOLICITE ATENCIN MDICA DE INMEDIATO SI:  La temperatura oral se eleva sin motivo por encima de 101 o segn le indique el profesional que lo asiste.   Tiene una prdida de lquido por la vagina (canal de parto). Si sospecha una ruptura de las [place], tmese la temperatura y llame al profesional para informarlo sobre esto.   Observa unas pequeas manchas, una hemorragia vaginal o elimina cogulos. Notifique al profesional acerca de la cantidad y de cuntos apsitos est utilizando. Unas pequeas manchas de sangre son algo comn durante el Psychiatrist, especialmente despus de Sales promotion account executive.   Presenta un olor desagradable en la secrecin vaginal y observa un cambio en el color, de transparente a blanco.   Contina con las nuseas y no obtiene alivio de los remedios indicados. Vomita sangre o algo similar a la borra del caf.   [place] o sube ms de 900 g. en una semana, o segn lo indicado por el profesional que la asiste.   Observa que se le Southwest Airlines, las manos, los pies o las piernas.   Ha estado expuesta a la rubola y no ha sufrido la enfermedad.   Ha estado expuesta a la quinta enfermedad o a la varicela.   Presenta dolor abdominal. Las molestias en el ligamento redondo son Neomia Dear causa no cancerosa (benigna) frecuente de dolor abdominal durante el embarazo. El profesional que la asiste deber evaluarla.   Presenta dolor de cabeza intenso que no se [place].   Presenta fiebre, diarrea, dolor al orinar o le falta la respiracin.   Presenta dificultad para ver, visin borrosa, o visin doble.   Sufre una cada, un accidente de trnsito o cualquier tipo de trauma.   Vive en un hogar en el que existe violencia fsica o mental.  Document Released: 02/16/2005 Document Re-Released: 08/03/2009 [facility] Patient Information 2011 [place], [place].

## 2011-02-03 NOTE — Progress Notes (Signed)
Advair-consider singulair for cost.  Wrist splints. Subjective:    Meghan Mclean is a 35 y.o. [id number] [redacted]w[redacted]d being seen today for her obstetrical visit.  Patient reports cough, wheezing-not taking preventive meds as directed. Fetal movement: normal.  Objective:    BP 90/61  Temp 97 F (36.1 C)  Wt 161 lb 6.4 oz (73.211 kg)  LMP 08/06/2010  Physical Exam  Exam  FHT:  156 BPM  Uterine Size: 26 cm  Presentation: unsure     Assessment:    Pregnancy:  [id number]    Plan:    Patient Active Problem List  Diagnoses  . CANDIDIASIS OF VULVA AND VAGINA  . ANEMIA  . PICA  . SINUSITIS  . ALLERGIC RHINITIS  . BOILS, RECURRENT  . HEADACHE  . Asthma  . Grand multipara  . Carpal tunnel syndrome  . Maternal asthma complicating pregnancy    Add preventative. Follow up in 3 wks.

## 2011-02-03 NOTE — Progress Notes (Signed)
Having wrist pain. Is unable to take her PNV, it caused her asthma to worsen.

## 2011-02-03 NOTE — Progress Notes (Signed)
Nutrition Note:  Seen for 1st [facility] visit.  Pt receives [facility] services.  Dx. Uncontrolled asthma. Pt reports intake of 2 meals and no snacks, all food groups included daily. No food allergies and no N/V reported.  Current weight 161#, gain of 11# plots within normal limits @ [redacted]w[redacted]d gestation.  Discussed wt gain goals of 15-25#.  Pt agrees to increase overall intake to 3-4 smaller meals qd.   Follow up if referred.  Cy Blamer, RD

## 2011-02-07 ENCOUNTER — Other Ambulatory Visit: Payer: Self-pay | Admitting: *Deleted

## 2011-02-07 DIAGNOSIS — J45909 Unspecified asthma, uncomplicated: Secondary | ICD-10-CM

## 2011-02-07 DIAGNOSIS — Z641 Problems related to multiparity: Secondary | ICD-10-CM

## 2011-02-07 DIAGNOSIS — G5600 Carpal tunnel syndrome, unspecified upper limb: Secondary | ICD-10-CM

## 2011-02-07 MED ORDER — MONTELUKAST SODIUM 10 MG PO TABS: 10.0000 mg | ORAL_TABLET | Freq: Every day | ORAL | Status: DC

## 2011-02-07 MED ORDER — ALBUTEROL SULFATE HFA 108 (90 BASE) MCG/ACT IN AERS: 2.0000 | INHALATION_SPRAY | Freq: Four times a day (QID) | RESPIRATORY_TRACT | Status: DC | PRN

## 2011-02-07 MED ORDER — FLUTICASONE-SALMETEROL 100-50 MCG/DOSE IN AEPB: 1.0000 | INHALATION_SPRAY | Freq: Two times a day (BID) | RESPIRATORY_TRACT | Status: DC

## 2011-02-07 MED ORDER — LORATADINE 10 MG PO CAPS: 1.0000 | ORAL_CAPSULE | Freq: Every day | ORAL | Status: DC

## 2011-02-07 NOTE — Telephone Encounter (Signed)
[place] [facility] Care Manager at [facility]) called stating that this patient has just gotten her "[employer]". Please send all Rx request to pharmacy on file [facility] health department pharmacy) for cheaper price for the patient. For any questions please call [place] Curry at [phone]. Thank you.

## 2011-02-07 NOTE — Telephone Encounter (Signed)
Pt called requesting written rx for her meds : Loratidine, Albuterol and Advair and Singulair.

## 2011-02-08 ENCOUNTER — Telehealth: Payer: Self-pay | Admitting: *Deleted

## 2011-02-08 ENCOUNTER — Ambulatory Visit (HOSPITAL_COMMUNITY)
Admission: RE | Admit: 2011-02-08 | Discharge: 2011-02-08 | Disposition: A | Discharge status: Home / Self Care | Payer: Self-pay | Source: Ambulatory Visit | Attending: Family Medicine | Admitting: Family Medicine

## 2011-02-08 DIAGNOSIS — O0930 Supervision of pregnancy with insufficient antenatal care, unspecified trimester: Secondary | ICD-10-CM | POA: Insufficient documentation

## 2011-02-08 DIAGNOSIS — Z3689 Encounter for other specified antenatal screening: Secondary | ICD-10-CM | POA: Insufficient documentation

## 2011-02-08 DIAGNOSIS — Z641 Problems related to multiparity: Secondary | ICD-10-CM

## 2011-02-08 NOTE — Telephone Encounter (Signed)
Patient came to window stating went to get medicine per interpreter and it wasn't there. Informed pt. Computers down, would need to call her when computers are up so we can find out what medicines and if they were sent by computer and where to , etc.  Took pt cell phone number to call her back with- [phone]

## 2011-02-08 NOTE — Telephone Encounter (Signed)
Called pt. With Interpreter Eda Royal, patient told interpreter it has all been straightened out, the pharmacy at [facility] Dept. Has just called and told her they have 2 of her medicines ready and are going to try to help with the other two.

## 2011-02-14 LAB — CBC
HCT: 35.5 — ABNORMAL LOW
Hemoglobin: 12.5
MCHC: 35.1
RDW: 13.3

## 2011-02-15 LAB — DIFFERENTIAL
Basophils Absolute: 0
Basophils Absolute: 0
Basophils Relative: 0
Eosinophils Absolute: 0.4
Eosinophils Absolute: 0.4
Eosinophils Relative: 3
Eosinophils Relative: 4
Lymphs Abs: 2.3
Monocytes Absolute: 0.7

## 2011-02-15 LAB — CBC
HCT: 35.2 — ABNORMAL LOW
HCT: 37.1
Hemoglobin: 12.2
MCHC: 34.8
MCV: 88.2
Platelets: 192
RDW: 14.2
RDW: 14.7

## 2011-02-15 LAB — INFLUENZA A+B VIRUS AG-DIRECT(RAPID): Inflenza A Ag: NEGATIVE

## 2011-02-17 LAB — CBC
MCV: 90.4
Platelets: 118 — ABNORMAL LOW
WBC: 7.8

## 2011-02-17 LAB — RPR: RPR Ser Ql: NONREACTIVE

## 2011-02-24 ENCOUNTER — Ambulatory Visit (INDEPENDENT_AMBULATORY_CARE_PROVIDER_SITE_OTHER): Payer: Self-pay | Admitting: Obstetrics and Gynecology

## 2011-02-24 ENCOUNTER — Encounter: Payer: Self-pay | Admitting: Obstetrics and Gynecology

## 2011-02-24 ENCOUNTER — Other Ambulatory Visit: Payer: Self-pay | Admitting: Obstetrics and Gynecology

## 2011-02-24 DIAGNOSIS — J45909 Unspecified asthma, uncomplicated: Secondary | ICD-10-CM

## 2011-02-24 DIAGNOSIS — O26899 Other specified pregnancy related conditions, unspecified trimester: Secondary | ICD-10-CM

## 2011-02-24 DIAGNOSIS — Z23 Encounter for immunization: Secondary | ICD-10-CM

## 2011-02-24 DIAGNOSIS — R51 Headache: Secondary | ICD-10-CM

## 2011-02-24 DIAGNOSIS — O9989 Other specified diseases and conditions complicating pregnancy, childbirth and the puerperium: Secondary | ICD-10-CM

## 2011-02-24 LAB — CBC
MCH: 31.8 pg (ref 26.0–34.0)
MCHC: 34.6 g/dL (ref 30.0–36.0)
Platelets: 173 10*3/uL (ref 150–400)
RBC: 3.93 MIL/uL (ref 3.87–5.11)
RDW: 13.5 % (ref 11.5–15.5)

## 2011-02-24 LAB — POCT URINALYSIS DIP (DEVICE)
Bilirubin Urine: NEGATIVE
Leukocytes, UA: NEGATIVE
Nitrite: NEGATIVE
Urobilinogen, UA: 0.2 mg/dL (ref 0.0–1.0)
pH: 6.5 (ref 5.0–8.0)

## 2011-02-24 MED ORDER — INFLUENZA VIRUS VACC SPLIT PF IM SUSP
0.5000 mL | Freq: Once | INTRAMUSCULAR | Status: AC
  Administered 2011-02-24: 0.5 mL via INTRAMUSCULAR

## 2011-02-24 NOTE — Progress Notes (Signed)
Swelling in wrists because of carpal tunnel. No vaginal bleeding. Pulse 84. Used interpreter Delorise Royals. Pt 1hr gtt due at 10:35 also needs 28 week labs.

## 2011-02-24 NOTE — Progress Notes (Signed)
No sx PTL. Good FM. Wants Rx Singulair and denies wheezing or SOB at present. Uses Advair on regular basis. Has mild H/A present x 1 wk and has not tried anything for relief. Has mild CTS sx. Getting 1 hr OGT today.

## 2011-02-24 NOTE — Progress Notes (Signed)
Addended by: Caren Griffins C on: 02/24/2011 10:38 AM   Modules accepted: Orders

## 2011-02-25 LAB — WET PREP, GENITAL
Clue Cells Wet Prep HPF POC: NONE SEEN
Trich, Wet Prep: NONE SEEN

## 2011-02-25 LAB — URINALYSIS, ROUTINE W REFLEX MICROSCOPIC
Bilirubin Urine: NEGATIVE
Hgb urine dipstick: NEGATIVE
Ketones, ur: NEGATIVE
Protein, ur: NEGATIVE
Urobilinogen, UA: 0.2

## 2011-02-25 LAB — RPR

## 2011-03-10 ENCOUNTER — Ambulatory Visit (INDEPENDENT_AMBULATORY_CARE_PROVIDER_SITE_OTHER): Payer: Self-pay | Admitting: Obstetrics & Gynecology

## 2011-03-10 ENCOUNTER — Encounter: Payer: Self-pay | Admitting: Obstetrics & Gynecology

## 2011-03-10 ENCOUNTER — Other Ambulatory Visit: Payer: Self-pay | Admitting: Obstetrics & Gynecology

## 2011-03-10 VITALS — BP 90/61 | Temp 97.0°F | Wt 164.3 lb

## 2011-03-10 DIAGNOSIS — L299 Pruritus, unspecified: Secondary | ICD-10-CM

## 2011-03-10 DIAGNOSIS — O0990 Supervision of high risk pregnancy, unspecified, unspecified trimester: Secondary | ICD-10-CM

## 2011-03-10 DIAGNOSIS — Z3480 Encounter for supervision of other normal pregnancy, unspecified trimester: Secondary | ICD-10-CM

## 2011-03-10 LAB — POCT URINALYSIS DIP (DEVICE)
Bilirubin Urine: NEGATIVE
Ketones, ur: NEGATIVE mg/dL
Leukocytes, UA: NEGATIVE
Nitrite: NEGATIVE

## 2011-03-10 MED ORDER — DIPHENHYDRAMINE HCL 50 MG PO TABS: 50.0000 mg | ORAL_TABLET | Freq: Every evening | ORAL | Status: DC | PRN

## 2011-03-10 NOTE — Patient Instructions (Signed)
Prevencin de Sport and exercise psychologist (Preventing Preterm Labor) Un parto prematuro ocurre cuando la mujer embarazada tiene contracciones uterinas que causan la apertura, el acortamiento y el afinamiento del cuello del tero, antes de las 37 semanas de [place]. Tendr contracciones regulares cada 2 a 3 minutos. Esto generalmente causa molestias o dolor. CUIDADOS EN EL HOGAR  Consuma una dieta saludable.   [employer] las vitaminas segn le haya indicado el mdico.   Beba una cantidad de lquido suficiente como para Pharmacologist la orina de tono claro o color amarillo plido todos [place].   Descanse y duerma.   No tenga relaciones sexuales si tiene un parto prematuro o alto riesgo de tenerlo.   Siga las instrucciones del mdico acerca de su [place], los medicamentos y los exmenes.   Evite el estrs.   Evite los esfuerzos extenuantes o la actividad fsica extensa.   No fume.  SOLICITE AYUDA DE INMEDIATO SI:   Tiene contracciones.   Siente dolor abdominal.   Tiene sangrado que proviene de la vagina.   Siente dolor al [employer].   Observa una secrecin anormal que proviene de la vagina.   Tiene una temperatura oral de ms de 102 F (38.9 C).  ASEGRESE DE QUE:  Comprende estas instrucciones.   Controlar su enfermedad.   Solicitar ayuda si no mejora o si empeora.  Document Released: 06/11/2010 Document Revised: 01/19/2011 [facility] Patient Information 2012 [place], [place].

## 2011-03-10 NOTE — Progress Notes (Signed)
Asthma stable-not using albuterol inhaler at all.  C/o itching all over.  Will check bile salts.  Benadryl prn.

## 2011-03-10 NOTE — Progress Notes (Signed)
Pulse- 91.   Pt c/o of itching all over body.

## 2011-03-24 ENCOUNTER — Ambulatory Visit (INDEPENDENT_AMBULATORY_CARE_PROVIDER_SITE_OTHER): Payer: Self-pay | Admitting: Family Medicine

## 2011-03-24 VITALS — BP 106/68 | Temp 97.2°F | Wt 166.1 lb

## 2011-03-24 DIAGNOSIS — O0990 Supervision of high risk pregnancy, unspecified, unspecified trimester: Secondary | ICD-10-CM

## 2011-03-24 DIAGNOSIS — J45909 Unspecified asthma, uncomplicated: Secondary | ICD-10-CM

## 2011-03-24 LAB — POCT URINALYSIS DIP (DEVICE)
Glucose, UA: NEGATIVE mg/dL
Ketones, ur: NEGATIVE mg/dL
Leukocytes, UA: NEGATIVE
Specific Gravity, Urine: 1.015 (ref 1.005–1.030)
Urobilinogen, UA: 0.2 mg/dL (ref 0.0–1.0)

## 2011-03-24 MED ORDER — FLUTICASONE-SALMETEROL 100-50 MCG/DOSE IN AEPB: 1.0000 | INHALATION_SPRAY | Freq: Two times a day (BID) | RESPIRATORY_TRACT | Status: DC

## 2011-03-24 MED ORDER — ALBUTEROL SULFATE HFA 108 (90 BASE) MCG/ACT IN AERS: 2.0000 | INHALATION_SPRAY | Freq: Four times a day (QID) | RESPIRATORY_TRACT | Status: DC | PRN

## 2011-03-24 NOTE — Progress Notes (Signed)
Per pt she is up to date on tdap. C/o numbness in her fingers Has pelvic pressure and some contractions since yesterday

## 2011-03-24 NOTE — Patient Instructions (Signed)
Glennis Brink - Chartered certified accountant trimestre (Pregnancy - Third Trimester) El tercer trimestre del embarazo (los ltimos 3 meses) es el perodo de cambios ms rpidos que atraviesan usted y el beb. El aumento de peso es ms rpido. El beb alcanza un largo de aproximadamente 50 cm (20 pulgadas) y pesa entre 2,700 y 4,500 kg (6 a 10 libras). El beb gana ms tejido graso y ya est listo para la vida fuera del cuerpo de la [place]. Mientras estn en el interior, los bebs tienen perodos de sueo y vigilia, Armed forces technical officer y tienen 68. Quizs sienta pequeas contracciones del tero. Este es el falso trabajo de [place]. Tambin se las conoce como contracciones de [place]. Es como una prctica del parto. Los problemas ms habituales de esta etapa del embarazo incluyen mayor dificultad para respirar, [place] manos y los pies por retencin de lquidos y la necesidad de Garment/textile technologist con ms frecuencia debido a que el tero y el beb presionan sobre la vejiga.  EXAMENES PRENATALES  Durante los Masco Corporation, deber seguir realizando pruebas de [place], segn avance el [place]. Estas pruebas se realizan para controlar su salud y la del beb. Tambin se realizan anlisis de sangre para Ryland Group niveles de [place]. La anemia (bajo nivel de hemoglobina) es frecuente durante el embarazo. Para prevenirla, se administran hierro y vitaminas. Tambin le harn nuevas pruebas para descartar la diabetes. Podrn repetirle algunas de las Corning Incorporated hicieron previamente.   En cada visita le medirn el tamao del tero. Es para asegurarse de que el beb se desarrolla correctamente.   Tambin en cada visita la pesarn. Esto se realiza para asegurarse de que aumenta de peso al ritmo indicado y que usted y su beb evolucionan normalmente.   En algunas ocasiones se realiza una ecografa para confirmar el correcto desarrollo y evolucin del beb. Esta prueba se realiza con ondas sonoras inofensivas para el beb, de modo  que el profesional pueda calcular con ms precisin la fecha del [place].   [place] posibilidades de la anestesia si necesita cesrea.  Algunas veces se realizan pruebas especializadas del lquido amnitico que rodea al beb. Esta prueba se denomina amniocentesis. El lquido amnitico se obtiene introduciendo una aguja en el abdomen (vientre). En ocasiones se lleva a cabo cerca del final del embarazo, si es Tree surgeon. En este caso se realiza para asegurarse de que los pulmones del beb estn lo suficientemente maduros como para que pueda vivir fuera del tero. CAMBIOS QUE OCURREN EN EL TERCER TRIMESTRE DEL EMBARAZO Su organismo atravesar diferentes cambios durante el embarazo que varan de Ardelia Mems persona a Theatre manager. [place]verse con el profesional que la asiste acerca los cambios que usted note y que la preocupen.  Durante el ltimo trimestre probablemente sienta un aumento del apetito. Es normal tener "antojos" de Diplomatic Services operational officer. Esto vara de Ardelia Mems persona a otra y de un embarazo a Lexicographer.   Podrn aparecer las primeras estras en las caderas, abdomen y [place]. Estos son cambios normales del cuerpo durante el [place]. No existen medicamentos ni ejercicios que puedan prevenir Abbott Laboratories.   El estreimiento puede tratarse con un laxante o agregando fibra a su dieta. Beber grandes cantidades de lquidos, tomar fibras en forma de verduras, frutas y granos integrales es de [place].   Tambin es beneficioso practicar actividad fsica. Si ha sido una persona Radio broadcast assistant, podr continuar con la State Farm de las actividades durante el mismo. Si ha sido IAC/InterActiveCorp, puede ser beneficioso  que comience con un programa de ejercicios, como Pension scheme manager. Consulte con el profesional que la asiste antes de comenzar un programa de ejercicios.   Evite el consumo de cigarrillos, el alcohol, los medicamentos no prescritos y las "drogas de la calle" durante el [place]. Estas sustancias  qumicas afectan la formacin y el desarrollo del beb. Evite estas sustancias durante todo el embarazo para asegurar el nacimiento de un beb sano.   Dolor de espalda, venas varicosas y hemorroides podran aparecer o empeorar.   Los movimientos del beb pueden ser ms bruscos y aparecer ms a menudo.   Puede que note dificultades para respirar facilmente.   El ombligo podra salrsele hacia afuera.   Puede segregar un lquido amarillento (calostro) de las Universal.   Puede segregar mucus con sangre. Esto normalmente ocurre unos pocos das a una semana antes de que comience el [place] de [place].  [place] parte de los cuidados que se aconsejan son los mismos que los indicados para las primeras etapas del Media planner. Es importante que concurra a todas las citas con el profesional y siga sus instrucciones con Engineer, site a los medicamentos que deba Risk manager, a la actividad fsica y a Engineer, technical sales.   Durante el embarazo debe obtener nutrientes para usted y para su beb. Consuma alimentos balanceados a intervalos regulares. Elija alimentos como carne, pescado, [place] y otros productos lcteos descremados, verduras, frutas, panes integrales y cereales. El Conservation officer, nature cul es el aumento de peso ideal.   Las relaciones sexuales pueden continuarse hasta casi el final del embarazo, si no se presentan otros problemas como prdida prematura (antes de tiempo) de lquido amnitico, hemorragia vaginal o dolor abdominal (en el vientre).   Rhame, si no tiene restricciones. Consulte con el profesional que la asiste si no sabe con certeza si determinados ejercicios son seguros. El mayor aumento de peso se produce [place] ltimos trimestres del [place].   Haga reposo con frecuencia, con las piernas elevadas, o segn lo necesite para evitar los calambres y el dolor de cintura.   Use un buen sostn o como los que se usan para hacer  deportes para Public house manager la sensibilidad de las [place]. Tambin puede serle til si lo [place] mientras duerme. Si pierde Adult nurse, podr [employer] Energy.   No utilice la baera con agua caliente, baos turcos y saunas.   Colquese el cinturn de seguridad cuando conduzca. Este la proteger a usted y al beb en caso de accidente.   Evite comer carne cruda y el contacto con los utensilios y desperdicios de los gatos. Estos elementos contienen grmenes que pueden causar defectos de nacimiento en el beb.   Es fcil perder algo de orina durante el [place]. Apretar y Software engineer los msculos de la pelvis la ayudar con este problema. Practique detener la miccin cuando est en el bao. Estos son los mismos msculos que Advertising copywriter. Son Sonic [employer]otive mismos msculos que utiliza cuando trata de The St. Paul [employer]ers gases. Puede practicar apretando estos msculos Western [employer] Financial, y repetir esto tres veces por da aproximadamente. Una vez que conozca qu msculos debe contraer, no realice estos ejercicios durante la miccin. Puede favorecerle una infeccin si la orina vuelve hacia atrs.   Pida ayuda si tiene necesidades econmicas, de asesoramiento o nutricionales durante el [place]. El profesional podr ayudarla con respecto a estas necesidades, o derivarla a otros especialistas.   Practique la ida Goldman Sachs hospital a modo  de prueba.   Tome clases prenatales junto con su pareja para comprender, practicar y hacer preguntas acerca del Mat Carne de parto y el nacimiento.   Prepare la habitacin del beb.   No viaje fuera de la ciudad a menos que sea absolutamente necesario y con el consejo del mdico.   Use slo zapatos bajos sin taco para tener un mejor equilibrio y prevenir cadas.  EL CONSUMO DE MEDICAMENTOS Y DROGAS DURANTE EL EMBARAZO  Contine tomando las vitaminas apropiadas para esta etapa tal como se le indic. Las vitaminas deben contener un miligramo de cido flico y deben suplementarse con  hierro. Guarde todas las vitaminas fuera del alcance de los nios. La ingestin de slo un par de vitaminas o comprimidos que contengan hierro pueden ocasionar la American Electric Power en un beb o en un nio pequeo.   Evite el uso de [place], inclusive los de venta [place], que no hayan sido prescritos o indicados por el profesional que la asiste. Algunos medicamentos pueden causar problemas fsicos al beb. Utilice los medicamentos de venta libre o de prescripcin para Conservation officer, historic buildings, Health and safety inspector o la [place], segn se lo indique el profesional que lo asiste. No utilice aspirina, ibuprofeno (Motrin, Advil, Nuprin) o naproxeno (Aleve) a menos que el profesional la autorice.   El alcohol se asocia a cierto nmero de defectos del nacimiento, incluido el sndrome de alcoholismo fetal. Debe evitar el consumo de alcohol en cualquiera de sus formas. El cigarrillo causa nacimientos prematuros y bebs de bajo peso al nacer. Las drogas de la calle son muy nocivas para el beb y estn absolutamente prohibidas. Un beb que nace de Safeco Corporation, ser adicto al nacer. Ese beb tendr los mismos sntomas de abstinencia que un adulto.   Infrmele al profesional si consume alguna droga.  SOLICITE ATENCIN MDICA SI: Tiene alguna preocupacin Solicitor. Es mejor que llame para formular las preguntas si no puede esperar hasta la prxima visita, que sentirse preocupada por ellas.  [place] o no cul es el sexo de su beb. Si es un varn, ste es el momento de pensar acerca de la circuncisin. La circuncisin es la extirpacin del prepucio. Esta es la piel que cubre el extremo sensible del pene. No hay un motivo mdico que lo justifique. Generalmente la decisin se toma segn lo que sea popular en ese momento, o se basa en creencias religiosas. Podr conversar estos temas con el profesional que la asiste. SOLICITE ATENCIN MDICA DE INMEDIATO SI:  La temperatura oral se eleva  sin motivo por encima de 5 F (38.9 C) o segn le indique el profesional que la asiste.   Tiene una prdida de lquido por la vagina (canal de parto). Si sospecha una ruptura de las [place], tmese la temperatura y llame al profesional para informarlo sobre esto.   Observa unas pequeas manchas, una hemorragia vaginal o elimina cogulos. Avsele al profesional acerca de la cantidad y de cuntos apsitos est utilizando.   Presenta un olor desagradable en la secrecin vaginal y observa un cambio en el color, de transparente a blanco.   Ha vomitado durante ms de 24 horas.   Presenta escalofros o fiebre.   Comienza a sentir falta de [place].   Siente ardor al [place].   [place] o sube ms de 900 g (ms de 2 libras), o segn lo indicado por el profesional que la asiste. Observa que sbitamente se le [employer], las manos, los pies o las  piernas.   Presenta dolor abdominal. Las molestias en el ligamento redondo son Neomia Dear causa benigna (no cancerosa) frecuente de Engineer, mining abdominal durante el Psychiatrist, pero el profesional que la asiste deber evaluarlo.   Presenta dolor de cabeza intenso que no se [place].   Si no siente los movimientos del beb durante ms de tres horas. Si piensa que el beb no se mueve tanto como lo haca habitualmente, coma algo que Psychologist, clinical y Target Corporation lado izquierdo durante [place]. El beb debe moverse al menos 4  5 veces por hora. Comunquese inmediatamente si el beb se mueve menos que lo indicado.   Se cae, se ve involucrada en un accidente automovilstico o sufre algn tipo de traumatismo.   En su hogar hay violencia mental o fsica.  Document Released: 02/16/2005 Document Revised: 01/19/2011 [facility] Patient Information 2012 [place], [place]. Eleccin del mtodo anticonceptivo (Birth Recruitment consultant) Los anticonceptivos son mtodos, prcticas o dispositivos para evitar que se Facilities manager en una mujer sexualmente [place].  A  continuacin se indican algunos mtodos para evitar el embarazo.  No tener relaciones sexuales (abstinencia) es el mtodo ms seguro para el control de la natalidad. Requiere autocontrol. No hay riesgo de contraer enfermedades de transmisin sexual ni el sndrome de inmunodeficiencia adquirida (SIDA).   Abstinencia peridica requiere autocontrol en ciertos perodos del mes.   Mtodo calendario, teniendo en cuenta el momento de sus perodos menstruales todos los meses.   El mtodo de ovulacin es Administrator, Civil Service relaciones sexuales en la poca en la que est ovulando (formando un vulo).   El mtodo simptotrmico es Administrator, Civil Service relaciones sexuales en la poca en la que est ovulando con la utilizacin de un termmetro y los sntomas de la ovulacin.   El mtodo de postovulacin es tener relaciones sexuales despus de la ovulacin.  Estos mtodos no protegen contra las infecciones transmitidas sexualmente ni contra el SIDA.  Las pldoras anticonceptivas contienen estrgenos y Education officer, museum. Estos medicamentos actan impidiendo la ovulacin (la liberacin del huevo del ovario). El mdico prescribir pldoras anticonceptivas, y le har preguntas acerca de los riesgos de tomarlas. Las pldoras anticonceptivas no protegen contra las infecciones transmitidas sexualmente ni contra el SIDA.   La "minipldora" slo contiene progesterona. Deben tomarse todos los [address] del mes y debe prescribirlas el mdico. Estos mtodos no protegen contra las infecciones transmitidas sexualmente ni contra el SIDA.   Los anticonceptivos de Associate Professor, tambin llamados "la pldora del da despus" La pldora puede tomarse inmediatamente despus de Warehouse manager relaciones sexuales o hasta [address] despus, si piensa que su mtodo anticonceptivo ha [place], o fue forzada a Doctor, hospital. Es ms efectiva si se toma poco tiempo despus. No use los anticonceptivos de emergencia como nico mtodo anticonceptivo. Los anticonceptivos de  emergencia estn disponibles sin prescripcin mdica. Consltelo con su farmacutico.   Los condones son una vaina delgada de ltex, material sinttico o piel de cordero que se usan en el pene durante el acto sexual. Cristie Hem un esperimicida incorporado. Los condones de ltex Midwife y las enfermedades de transmisin sexual. Los condones "naturales" o de piel de cordero Development worker, international aid no protegen contra las enfermedades de transmisin sexual ni el sida.   Los condones femeninos son una vaina blanda y que se adaptan suavemente a la vagina antes de las Clinical research associate. Pueden evitar Firefighter y las enfermedades de transmisin sexual, inclusive el sida,   La esponja es una pieza de poliuretano circular suave con espermicida que  se inserta en la vagina luego de humedecerla y antes de Management consultant. No requiere prescripcin mdica. Estos mtodos no protegen contra las infecciones transmitidas sexualmente ni contra el SID.   El diafragma es una barrera de ltex redonda y Casimer Bilis que debe ser recomendado por un profesional. Se inserta en la vagina, junto con un gel espermicida. Luego de prepararlo, insrtelo antes de las [employer] St. [employer]. Debe dejar el diafragma colocado en la vagina durante 6 a 8 horas. La eliminacin y reinsercin siempre debe realizarse con un espermicida. Este mtodo no protege contra las infecciones transmitidas sexualmente ni contra el SIDA.   Las inyecciones de progesterona se administran cada 3 meses como mtodo anticonceptivo. Estas inyecciones contienen progesterona sinttica y no contienen estrgenos, Esta hormona impide que los ovarios liberen vulos. Tambin hacen que el moco cervical se espese y modifique el tejido de recubrimiento interno del tero. Esto hace ms difcil que los espermatozoides sobrevivan en el tero. No protege contra las infecciones transmitidas sexualmente o el sida.   [place] para el control de la natalidad contiene  hormonas similares a las de las pldoras, de modo que la efectividad, los riesgos y los efectos secundarios son los mismos.Deben cambiarse una vez por semana y se utilizan bajo prescripcin mdica. Es menos efectivo en las mujeres con sobrepeso. No protege contra las infecciones transmitidas sexualmente ni el sida.   Anillo vaginal contiene hormonas similares a las que contienen las pldoras anticonceptivas. Se deja colocado durante tres semanas, se lo retira durante una semana y luego se coloca uno nuevo. Trae un timer para colocar en el bolsillo y recordar cundo debe retirarlo y colocarse uno nuevo. Es necesario un examen previo y la prescripcin del mdico, al igual que con las pldoras y Event organiser. No protege contra las infecciones transmitidas sexualmente o el sida.   Las inyecciones de estrgeno ms progesterona se administran cada 28 a 30 das. Pueden aplicarse en el brazo, muslo o nalgas. No protege contra las infecciones transmitidas sexualmente ni contra el SIDA.   Dispositivo intrauterino (DIU): T de cobre o T con progesterona es un dispositivo con forma de T que se coloca en el tero de la mujer durante el perodo menstrual, para Neurosurgeon. La T de cobre dura 10 aos y el dispositivo de progesterona puede durar 5 aos. El DIU de progesterona tambin puede ayudar a Chief Operating Officer los perodos menstruales abundantes. No protege contra las infecciones transmitidas sexualmente ni contra el SIDA. El DIU de T de cobre se puede Chemical engineer como dispositivo de Associate Professor, si se inserta dentro de los [address] de tener relaciones sexuales sin proteccin.   El capuchn cervical es una barrera de ltex o taza plstica redonda y [place] que cubre el cuello del tero y debe ser colocada por un mdico. No necesitar Chemical engineer un espermicida para colocarlo o retirarlo cada vez que tiene Clinical research associate. No protege contra las infecciones transmitidas sexualmente ni contra el SIDA.   Los espermicidas son  qumicos que matan o bloquean el esperma y no lo dejan ingresar al cuello del tero y al tero. Vienen en forma de cremas, geles, supositorios, espuma o pastillas, y no requieren prescripcin. Se insertan en la vagina con un aplicador antes de Management consultant. Esto debe repetirse cada vez que tiene [employer] St. [employer].   El retiro es un mtodo en el que el hombre retira el pene durante las relaciones sexuales antes de llegar al clmax y depositar el esperma. No protege contra las infecciones transmitidas  sexualmente ni contra el SIDA.   La ligadura de trompas en la mujer se realiza sellando quirrgicamente las trompas de [place] lo que impide que el vulo descienda hacia el tero. No protege contra las infecciones transmitidas sexualmente ni contra el SIDA.   La esterilizacin masculina es cuando al hombre se le atan los conductos quirrgicamente (vasectoma) para que el esperma no ingrese a la vagina durante las Clinical research associate. No protege contra las infecciones transmitidas sexualmente ni contra el SIDA.  Independientemente del mtodo anticonceptivo que usted elija, es importante que utilice alguna forma de proteccin contra las infecciones que se transmiten sexualmente. Document Released: 05/09/2005 Document Revised: 06/11/2010 [facility] Patient Information 2012 [place], [place]. Amamantar al beb (Breastfeeding) LOS BENEFICIOS DE AMAMANTAR Para el beb  La primera leche (calostro ) ayuda al mejor funcionamiento del sistema digestivo del beb.   La leche tiene anticuerpos que provienen de la madre y que ayudan a prevenir las infecciones en el beb.   Hay una menor incidencia de asma, enfermedades alrgicas y SMSI (sndrome de muerte sbita nfantil).   Los nutrientes que contiene la [place] materna son mejores que las frmulas para el bibern y favorecen el desarrollo cerebral.   Los bebs amamantados sufren menos gases, clicos y constipacin.  Para la mam  La lactancia  materna favorece el desarrollo de un vnculo muy especial entre la madre y el beb.   Es ms conveniente, siempre disponible a la Optician, dispensing y ms econmica que la [employer]S Inc.   Consume caloras en la madre y la ayuda a perder el peso ganado durante el [place].   Favorece la contraccin del tero a su tamao normal, de manera ms rpida y [employer] H[employer]away las hemorragias luego del [place].   Las M.D.C. Holdings que amamantan tienen menor riesgo de Geophysical data processor de mama.  AMAMNTELO CON FRECUENCIA  Un beb sano, nacido a trmino, puede amamantarse con tanta frecuencia como cada hora, o espaciar las comidas cada tres horas.   Esta frecuencia variar de un beb a otro. Observe al beb cuando manifieste signos de hambre, antes que regirse por el reloj.   Amamntelo tan seguido como el beb lo solicite, o cuando usted sienta la necesidad de Paramedic sus [place].   Despierte al beb si han pasado 3  4 horas desde la ltima comida.   El amamantamiento frecuente la ayudar a producir ms [place] y a Education officer, community de Engineer, mining en los pezones e hinchazn de las [place].  LA POSICIN DEL BEB PARA AMAMANTARLO  Ya sea que se encuentre acostada o sentada, asegrese que el abdomen del beb enfrente el suyo.   Sostenga la mama con el pulgar por arriba y el resto de los dedos por debajo. Asegrese que sus dedos se encuentren lejos del pezn y de la boca del beb.   Toque suavemente los labios del beb y la mejilla ms cercana a la mama con el dedo o el pezn.   Cuando la boca del beb se abra lo suficiente, introduzca el pezn y la zona oscura que lo rodea tanto como le sea posible dentro de la boca.   Coloque a beb cerca suyo de modo que su nariz y mejillas toquen las mamas al Texas Instruments.  LAS COMIDAS  La duracin de cada comida vara de un beb a otro y de [place] comida a [place].   El beb debe succionar alrededor American Financial o tres minutos para que le llegue [place]. Esto se denomina "bajada". Por este motivo,  permita que el nio  se alimente en cada mama todo lo que desee. Terminar de mamar cuando haya recibido la cantidad [place] de nutrientes.   Para detener la succin coloque su dedo en la comisura de la boca del nio y Midwife entre sus encas antes de quitarle la mama de la boca. Esto la ayudar a English as a second language teacher.  REDUCIR LA CONGESTIN DE LAS MAMAS  Durante la primera semana despus del parto, usted puede experimentar Monsanto Company. Cuando las mamas estn congestionadas, se sienten calientes, llenas y molestas al tacto. Puede reducir la congestin si:   Lo amamanta frecuentemente, cada 2-3 horas. Las mams que CDW Corporation pronto y con frecuencia tienen menos problemas de [place].   Coloque bolsas fras livianas entre cada [place]. Esto ayuda a Building services engineer. Envuelva las bolsas de hielo en una toalla liviana para proteger su piel.   Aplique compresas hmedas calientes W[employer] W[employer]ley [employer] Company durante 5 a 10 minutos antes de amamantar al [employer]raw[employer]Hill. Esto aumenta la circulacin y [place] a que la [place].   [place]ee suavemente la mama antes y Psychologist, sport and exercise.   Asegrese que el nio vaca al menos una mama antes de cambiar de lado.   Use un sacaleche para vaciar la mama si el beb se duerme o no se alimenta bien. Tambin podr Phelps Dodge con esta bomba si tiene que volver al trabajo o siente que las mamas estn congestionadas.   Evite los biberones, chupetes o complementar la alimentacin con agua o jugos en lugar de la [place].   Verifique que el beb se encuentra en la posicin correcta mientras lo alimenta.   Evite el cansancio, el estrs y la anemia   Use un soutien que sostenga bien sus mamas y evite los que tienen aro.   Consuma una dieta balanceada y beba lquidos en cantidad.  Si sigue estas indicaciones, la congestin debe mejorar en 24 a 48 horas. Si an tiene dificultades, consulte a Barista. TENDR SUFICIENTE LECHE MI  BEB? Algunas veces las madres se preocupan acerca de si sus bebs tendrn la leche suficiente. Puede asegurarse que el beb tiene la leche suficiente si:  El beb succiona y escucha que traga activamente.   El nio se alimenta al menos 8 a 12 veces en 24 horas. Alimntelo hasta que se desprenda por sus propios medios o se quede dormido en la primera mama (al menos durante 10 a 20 minutos), luego ofrzcale el otro lado.   El beb moja 5 a 6 paales descartables (6 a 8 paales de tela) en 24 horas cuando tiene 5  6 das de vida.   Tiene al menos 2-3 deposiciones todos los [employer] primeros meses. La leche materna es todo el alimento que el beb necesita. No es necesario que el nio ingiera agua o preparados de bibern. De hecho, para ayudar a que sus mamas produzcan ms [place], lo mejor es no darle al beb suplementos durante las primeras semanas.   La materia fecal debe ser blanda y [place].   El beb debe aumentar 112 a 196 g por semana.  CUDESE Cuide sus mamas del siguiente modo:  Bese o dchese diariamente.   No lave sus pezones con jabn.   Comience a amamantar del lado izquierdo en una comida y del lado derecho en la siguiente.   Notar que [employer] suministro de [place] a los 2 a 5 [address]  [address] despus del [place]. Puede sentir algunas molestias por la congestin, lo  que hace que sus mamas estn duras y sensibles. La congestin disminuye en 24 a 48 horas. Mientras tanto, aplique toallas hmedas calientes durante 5 a 10 minutos antes de amamantar. Un masaje suave y la extraccin de un poco de leche antes de Museum/gallery exhibitions officer ablandarn las mamas y har ms fcil que el beb se agarre. Use un buen sostn y seque al aire los pezones durante 10 a 15 minutos luego de cada alimentacin.   Solo utilice apsitos de algodn.   Utilice lanolina [employer] pezones luego de [place]. No necesita lavarlos luego de alimentar al [employer].  Cudese del siguiente modo:   Consuma alimentos bien  balanceados y refrigerios nutritivos.   Dixie Dials, jugos de fruta y agua para Warehouse manager sed (alrededor de 8 vasos por Futures trader).   Descanse lo suficiente.   Aumente la ingesta de calcio en la dieta (1200mg /da).   Evite los alimentos que usted nota que puedan afectar al beb.  SOLICITE ATENCIN MDICA SI:  Tiene preguntas que formular o dificultades con la alimentacin a pecho.   Necesita ayuda.   Observa una zona dura, roja y que le duele en la zona de la mama, y se acompaa de fiebre de 100.5 F (38.1 C) o ms.   El beb est muy somnoliento como para alimentarse bien o tiene problemas para dormir.   El beb moja menos de 6 paales por da, a partir de los [address] de [place].   La piel del beb o la parte blanca de sus ojos est ms amarilla de lo que estaba en el hospital.   Se siente deprimida.  Document Released: 05/09/2005 Document Revised: 01/19/2011 [facility] Patient Information 2012 [place], [place].

## 2011-03-24 NOTE — Progress Notes (Signed)
Pt. Is doing ok. Still itching some-Bile salts were 5 WNL

## 2011-04-07 ENCOUNTER — Ambulatory Visit (INDEPENDENT_AMBULATORY_CARE_PROVIDER_SITE_OTHER): Payer: Self-pay | Admitting: Obstetrics & Gynecology

## 2011-04-07 ENCOUNTER — Encounter: Payer: Self-pay | Admitting: *Deleted

## 2011-04-07 DIAGNOSIS — O0990 Supervision of high risk pregnancy, unspecified, unspecified trimester: Secondary | ICD-10-CM

## 2011-04-07 LAB — POCT URINALYSIS DIP (DEVICE)
Bilirubin Urine: NEGATIVE
Glucose, UA: NEGATIVE mg/dL
Nitrite: NEGATIVE
Specific Gravity, Urine: 1.02 (ref 1.005–1.030)
Urobilinogen, UA: 0.2 mg/dL (ref 0.0–1.0)

## 2011-04-07 NOTE — Progress Notes (Signed)
Complaining of reflux--tums and zantac suggested.  Refrain from eating 2 hrs before going to bed.  Raise head of bed.  No caffeine.  Size greater than dates--US for growth.  Asthma stable

## 2011-04-07 NOTE — Patient Instructions (Signed)
Amamantar al beb (Breastfeeding) LOS BENEFICIOS DE AMAMANTAR Para el beb  La primera leche (calostro ) ayuda al mejor funcionamiento del sistema digestivo del beb.   La leche tiene anticuerpos que provienen de la madre y que ayudan a prevenir las infecciones en el beb.   Hay una menor incidencia de asma, enfermedades alrgicas y SMSI (sndrome de muerte sbita nfantil).   Los nutrientes que contiene la leche materna son mejores que las frmulas para el bibern y favorecen el desarrollo cerebral.   Los bebs amamantados sufren menos gases, clicos y constipacin.  Para la mam  La lactancia materna favorece el desarrollo de un vnculo muy especial entre la madre y el beb.   Es ms conveniente, siempre disponible a la temperatura adecuada y ms econmica que la leche maternizada.   Consume caloras en la madre y la ayuda a perder el peso ganado durante el embarazo.   Favorece la contraccin del tero a su tamao normal, de manera ms rpida y disminuye las hemorragias luego del parto.   Las madres que amamantan tienen menor riesgo de desarrollar cncer de mama.  AMAMNTELO CON FRECUENCIA  Un beb sano, nacido a trmino, puede amamantarse con tanta frecuencia como cada hora, o espaciar las comidas cada tres horas.   Esta frecuencia variar de un beb a otro. Observe al beb cuando manifieste signos de hambre, antes que regirse por el reloj.   Amamntelo tan seguido como el beb lo solicite, o cuando usted sienta la necesidad de aliviar sus mamas.   Despierte al beb si han pasado 3  4 horas desde la ltima comida.   El amamantamiento frecuente la ayudar a producir ms leche y a prevenir problemas de dolor en los pezones e hinchazn de las mamas.  LA POSICIN DEL BEB PARA AMAMANTARLO  Ya sea que se encuentre acostada o sentada, asegrese que el abdomen del beb enfrente el suyo.   Sostenga la mama con el pulgar por arriba y el resto de los dedos por debajo. Asegrese que  sus dedos se encuentren lejos del pezn y de la boca del beb.   Toque suavemente los labios del beb y la mejilla ms cercana a la mama con el dedo o el pezn.   Cuando la boca del beb se abra lo suficiente, introduzca el pezn y la zona oscura que lo rodea tanto como le sea posible dentro de la boca.   Coloque a beb cerca suyo de modo que su nariz y mejillas toquen las mamas al mamar.  LAS COMIDAS  La duracin de cada comida vara de un beb a otro y de una comida a otra.   El beb debe succionar alrededor de dos o tres minutos para que le llegue leche. Esto se denomina "bajada". Por este motivo, permita que el nio se alimente en cada mama todo lo que desee. Terminar de mamar cuando haya recibido la cantidad adecuada de nutrientes.   Para detener la succin coloque su dedo en la comisura de la boca del nio y deslcelo entre sus encas antes de quitarle la mama de la boca. Esto la ayudar a evitar el dolor en los pezones.  REDUCIR LA CONGESTIN DE LAS MAMAS  Durante la primera semana despus del parto, usted puede experimentar congestin en las mamas. Cuando las mamas estn congestionadas, se sienten calientes, llenas y molestas al tacto. Puede reducir la congestin si:   Lo amamanta frecuentemente, cada 2-3 horas. Las mams que amamantan pronto y con frecuencia tienen menos problemas   de congestin.   Coloque bolsas fras livianas entre cada mamada. Esto ayuda a reducir la hinchazn. Envuelva las bolsas de hielo en una toalla liviana para proteger su piel.   Aplique compresas hmedas calientes sobre la mama durante 5 a 10 minutos antes de amamantar al nio. Esto aumenta la circulacin y ayuda a que la leche fluya.   Masajee suavemente la mama antes y durante la alimentacin.   Asegrese que el nio vaca al menos una mama antes de cambiar de lado.   Use un sacaleche para vaciar la mama si el beb se duerme o no se alimenta bien. Tambin podr quitarse la leche con esta bomba si  tiene que volver al trabajo o siente que las mamas estn congestionadas.   Evite los biberones, chupetes o complementar la alimentacin con agua o jugos en lugar de la leche materna.   Verifique que el beb se encuentra en la posicin correcta mientras lo alimenta.   Evite el cansancio, el estrs y la anemia   Use un soutien que sostenga bien sus mamas y evite los que tienen aro.   Consuma una dieta balanceada y beba lquidos en cantidad.  Si sigue estas indicaciones, la congestin debe mejorar en 24 a 48 horas. Si an tiene dificultades, consulte a su asesor en lactancia. TENDR SUFICIENTE LECHE MI BEB? Algunas veces las madres se preocupan acerca de si sus bebs tendrn la leche suficiente. Puede asegurarse que el beb tiene la leche suficiente si:  El beb succiona y escucha que traga activamente.   El nio se alimenta al menos 8 a 12 veces en 24 horas. Alimntelo hasta que se desprenda por sus propios medios o se quede dormido en la primera mama (al menos durante 10 a 20 minutos), luego ofrzcale el otro lado.   El beb moja 5 a 6 paales descartables (6 a 8 paales de tela) en 24 horas cuando tiene 5  6 das de vida.   Tiene al menos 2-3 deposiciones todos los das en los primeros meses. La leche materna es todo el alimento que el beb necesita. No es necesario que el nio ingiera agua o preparados de bibern. De hecho, para ayudar a que sus mamas produzcan ms leche, lo mejor es no darle al beb suplementos durante las primeras semanas.   La materia fecal debe ser blanda y amarillenta.   El beb debe aumentar 112 a 196 g por semana.  CUDESE Cuide sus mamas del siguiente modo:  Bese o dchese diariamente.   No lave sus pezones con jabn.   Comience a amamantar del lado izquierdo en una comida y del lado derecho en la siguiente.   Notar que aumenta el suministro de leche a los 2 a 5 das despus del parto. Puede sentir algunas molestias por la congestin, lo que hace que  sus mamas estn duras y sensibles. La congestin disminuye en 24 a 48 horas. Mientras tanto, aplique toallas hmedas calientes durante 5 a 10 minutos antes de amamantar. Un masaje suave y la extraccin de un poco de leche antes de amamantar ablandarn las mamas y har ms fcil que el beb se agarre. Use un buen sostn y seque al aire los pezones durante 10 a 15 minutos luego de cada alimentacin.   Solo utilice apsitos de algodn.   Utilice lanolina pura sobre los pezones luego de amamantar. No necesita lavarlos luego de alimentar al nio.  Cudese del siguiente modo:   Consuma alimentos bien balanceados y refrigerios nutritivos.     Beba leche, jugos de fruta y agua para satisfacer la sed (alrededor de 8 vasos por da).   Descanse lo suficiente.   Aumente la ingesta de calcio en la dieta (1200mg/da).   Evite los alimentos que usted nota que puedan afectar al beb.  SOLICITE ATENCIN MDICA SI:  Tiene preguntas que formular o dificultades con la alimentacin a pecho.   Necesita ayuda.   Observa una zona dura, roja y que le duele en la zona de la mama, y se acompaa de fiebre de 100.5 F (38.1 C) o ms.   El beb est muy somnoliento como para alimentarse bien o tiene problemas para dormir.   El beb moja menos de 6 paales por da, a partir de los 5 das de vida.   La piel del beb o la parte blanca de sus ojos est ms amarilla de lo que estaba en el hospital.   Se siente deprimida.  Document Released: 05/09/2005 Document Revised: 01/19/2011 Exit[employer] Patient Information 2012 [employer]. 

## 2011-04-07 NOTE — Progress Notes (Signed)
U/S scheduled Nov. 16,2012 at 315pm.

## 2011-04-07 NOTE — Progress Notes (Signed)
Used lang line [id number]

## 2011-04-08 ENCOUNTER — Ambulatory Visit (HOSPITAL_COMMUNITY)
Admission: RE | Admit: 2011-04-08 | Discharge: 2011-04-08 | Disposition: A | Discharge status: Home / Self Care | Payer: Self-pay | Source: Ambulatory Visit | Attending: Obstetrics & Gynecology | Admitting: Obstetrics & Gynecology

## 2011-04-08 ENCOUNTER — Other Ambulatory Visit: Payer: Self-pay | Admitting: Obstetrics and Gynecology

## 2011-04-08 ENCOUNTER — Other Ambulatory Visit: Payer: Self-pay | Admitting: Obstetrics & Gynecology

## 2011-04-08 DIAGNOSIS — O0990 Supervision of high risk pregnancy, unspecified, unspecified trimester: Secondary | ICD-10-CM

## 2011-04-08 DIAGNOSIS — O3660X0 Maternal care for excessive fetal growth, unspecified trimester, not applicable or unspecified: Secondary | ICD-10-CM | POA: Insufficient documentation

## 2011-04-08 DIAGNOSIS — Z3689 Encounter for other specified antenatal screening: Secondary | ICD-10-CM | POA: Insufficient documentation

## 2011-04-21 ENCOUNTER — Ambulatory Visit (INDEPENDENT_AMBULATORY_CARE_PROVIDER_SITE_OTHER): Payer: Self-pay | Admitting: Physician Assistant

## 2011-04-21 ENCOUNTER — Other Ambulatory Visit: Payer: Self-pay | Admitting: Obstetrics and Gynecology

## 2011-04-21 ENCOUNTER — Encounter: Payer: Self-pay | Admitting: Physician Assistant

## 2011-04-21 VITALS — BP 106/72 | Temp 97.6°F | Wt 169.4 lb

## 2011-04-21 DIAGNOSIS — O0990 Supervision of high risk pregnancy, unspecified, unspecified trimester: Secondary | ICD-10-CM

## 2011-04-21 LAB — POCT URINALYSIS DIP (DEVICE)
Bilirubin Urine: NEGATIVE
Glucose, UA: NEGATIVE mg/dL
Ketones, ur: NEGATIVE mg/dL
Leukocytes, UA: NEGATIVE
Nitrite: NEGATIVE
pH: 7 (ref 5.0–8.0)

## 2011-04-21 NOTE — Progress Notes (Signed)
Pelvic pain and pressure and some contractions.

## 2011-04-21 NOTE — Progress Notes (Signed)
Addended by: Faythe Casa on: 04/21/2011 11:13 AM   Modules accepted: Orders

## 2011-04-21 NOTE — Progress Notes (Signed)
GBS today. SW today to discuss gratis sterilization programs available

## 2011-04-21 NOTE — Patient Instructions (Signed)
Amamantar al beb (Breastfeeding) LOS BENEFICIOS DE AMAMANTAR Para el beb  La primera leche (calostro ) ayuda al mejor funcionamiento del sistema digestivo del beb.   La leche tiene anticuerpos que provienen de la madre y que ayudan a prevenir las infecciones en el beb.   Hay una menor incidencia de asma, enfermedades alrgicas y SMSI (sndrome de muerte sbita nfantil).   Los nutrientes que contiene la leche materna son mejores que las frmulas para el bibern y favorecen el desarrollo cerebral.   Los bebs amamantados sufren menos gases, clicos y constipacin.  Para la mam  La lactancia materna favorece el desarrollo de un vnculo muy especial entre la madre y el beb.   Es ms conveniente, siempre disponible a la temperatura adecuada y ms econmica que la leche maternizada.   Consume caloras en la madre y la ayuda a perder el peso ganado durante el embarazo.   Favorece la contraccin del tero a su tamao normal, de manera ms rpida y disminuye las hemorragias luego del parto.   Las madres que amamantan tienen menor riesgo de desarrollar cncer de mama.  AMAMNTELO CON FRECUENCIA  Un beb sano, nacido a trmino, puede amamantarse con tanta frecuencia como cada hora, o espaciar las comidas cada tres horas.   Esta frecuencia variar de un beb a otro. Observe al beb cuando manifieste signos de hambre, antes que regirse por el reloj.   Amamntelo tan seguido como el beb lo solicite, o cuando usted sienta la necesidad de aliviar sus mamas.   Despierte al beb si han pasado 3  4 horas desde la ltima comida.   El amamantamiento frecuente la ayudar a producir ms leche y a prevenir problemas de dolor en los pezones e hinchazn de las mamas.  LA POSICIN DEL BEB PARA AMAMANTARLO  Ya sea que se encuentre acostada o sentada, asegrese que el abdomen del beb enfrente el suyo.   Sostenga la mama con el pulgar por arriba y el resto de los dedos por debajo. Asegrese que  sus dedos se encuentren lejos del pezn y de la boca del beb.   Toque suavemente los labios del beb y la mejilla ms cercana a la mama con el dedo o el pezn.   Cuando la boca del beb se abra lo suficiente, introduzca el pezn y la zona oscura que lo rodea tanto como le sea posible dentro de la boca.   Coloque a beb cerca suyo de modo que su nariz y mejillas toquen las mamas al mamar.  LAS COMIDAS  La duracin de cada comida vara de un beb a otro y de una comida a otra.   El beb debe succionar alrededor de dos o tres minutos para que le llegue leche. Esto se denomina "bajada". Por este motivo, permita que el nio se alimente en cada mama todo lo que desee. Terminar de mamar cuando haya recibido la cantidad adecuada de nutrientes.   Para detener la succin coloque su dedo en la comisura de la boca del nio y deslcelo entre sus encas antes de quitarle la mama de la boca. Esto la ayudar a evitar el dolor en los pezones.  REDUCIR LA CONGESTIN DE LAS MAMAS  Durante la primera semana despus del parto, usted puede experimentar congestin en las mamas. Cuando las mamas estn congestionadas, se sienten calientes, llenas y molestas al tacto. Puede reducir la congestin si:   Lo amamanta frecuentemente, cada 2-3 horas. Las mams que amamantan pronto y con frecuencia tienen menos problemas   de congestin.   Coloque bolsas fras livianas entre cada mamada. Esto ayuda a reducir la hinchazn. Envuelva las bolsas de hielo en una toalla liviana para proteger su piel.   Aplique compresas hmedas calientes sobre la mama durante 5 a 10 minutos antes de amamantar al nio. Esto aumenta la circulacin y ayuda a que la leche fluya.   Masajee suavemente la mama antes y durante la alimentacin.   Asegrese que el nio vaca al menos una mama antes de cambiar de lado.   Use un sacaleche para vaciar la mama si el beb se duerme o no se alimenta bien. Tambin podr quitarse la leche con esta bomba si  tiene que volver al trabajo o siente que las mamas estn congestionadas.   Evite los biberones, chupetes o complementar la alimentacin con agua o jugos en lugar de la leche materna.   Verifique que el beb se encuentra en la posicin correcta mientras lo alimenta.   Evite el cansancio, el estrs y la anemia   Use un soutien que sostenga bien sus mamas y evite los que tienen aro.   Consuma una dieta balanceada y beba lquidos en cantidad.  Si sigue estas indicaciones, la congestin debe mejorar en 24 a 48 horas. Si an tiene dificultades, consulte a su asesor en lactancia. TENDR SUFICIENTE LECHE MI BEB? Algunas veces las madres se preocupan acerca de si sus bebs tendrn la leche suficiente. Puede asegurarse que el beb tiene la leche suficiente si:  El beb succiona y escucha que traga activamente.   El nio se alimenta al menos 8 a 12 veces en 24 horas. Alimntelo hasta que se desprenda por sus propios medios o se quede dormido en la primera mama (al menos durante 10 a 20 minutos), luego ofrzcale el otro lado.   El beb moja 5 a 6 paales descartables (6 a 8 paales de tela) en 24 horas cuando tiene 5  6 das de vida.   Tiene al menos 2-3 deposiciones todos los das en los primeros meses. La leche materna es todo el alimento que el beb necesita. No es necesario que el nio ingiera agua o preparados de bibern. De hecho, para ayudar a que sus mamas produzcan ms leche, lo mejor es no darle al beb suplementos durante las primeras semanas.   La materia fecal debe ser blanda y amarillenta.   El beb debe aumentar 112 a 196 g por semana.  CUDESE Cuide sus mamas del siguiente modo:  Bese o dchese diariamente.   No lave sus pezones con jabn.   Comience a amamantar del lado izquierdo en una comida y del lado derecho en la siguiente.   Notar que aumenta el suministro de leche a los 2 a 5 das despus del parto. Puede sentir algunas molestias por la congestin, lo que hace que  sus mamas estn duras y sensibles. La congestin disminuye en 24 a 48 horas. Mientras tanto, aplique toallas hmedas calientes durante 5 a 10 minutos antes de amamantar. Un masaje suave y la extraccin de un poco de leche antes de amamantar ablandarn las mamas y har ms fcil que el beb se agarre. Use un buen sostn y seque al aire los pezones durante 10 a 15 minutos luego de cada alimentacin.   Solo utilice apsitos de algodn.   Utilice lanolina pura sobre los pezones luego de amamantar. No necesita lavarlos luego de alimentar al nio.  Cudese del siguiente modo:   Consuma alimentos bien balanceados y refrigerios nutritivos.     Beba leche, jugos de fruta y agua para satisfacer la sed (alrededor de 8 vasos por da).   Descanse lo suficiente.   Aumente la ingesta de calcio en la dieta (1200mg/da).   Evite los alimentos que usted nota que puedan afectar al beb.  SOLICITE ATENCIN MDICA SI:  Tiene preguntas que formular o dificultades con la alimentacin a pecho.   Necesita ayuda.   Observa una zona dura, roja y que le duele en la zona de la mama, y se acompaa de fiebre de 100.5 F (38.1 C) o ms.   El beb est muy somnoliento como para alimentarse bien o tiene problemas para dormir.   El beb moja menos de 6 paales por da, a partir de los 5 das de vida.   La piel del beb o la parte blanca de sus ojos est ms amarilla de lo que estaba en el hospital.   Se siente deprimida.  Document Released: 05/09/2005 Document Revised: 01/19/2011 Exit[employer] Patient Information 2012 [employer]. 

## 2011-05-05 ENCOUNTER — Ambulatory Visit (INDEPENDENT_AMBULATORY_CARE_PROVIDER_SITE_OTHER): Payer: Self-pay | Admitting: Obstetrics & Gynecology

## 2011-05-05 DIAGNOSIS — D649 Anemia, unspecified: Secondary | ICD-10-CM

## 2011-05-05 DIAGNOSIS — O0990 Supervision of high risk pregnancy, unspecified, unspecified trimester: Secondary | ICD-10-CM

## 2011-05-05 DIAGNOSIS — J45909 Unspecified asthma, uncomplicated: Secondary | ICD-10-CM

## 2011-05-05 DIAGNOSIS — O99891 Other specified diseases and conditions complicating pregnancy: Secondary | ICD-10-CM

## 2011-05-05 DIAGNOSIS — F5089 Other specified eating disorder: Secondary | ICD-10-CM

## 2011-05-05 LAB — POCT URINALYSIS DIP (DEVICE)
Hgb urine dipstick: NEGATIVE
Ketones, ur: NEGATIVE mg/dL
Protein, ur: 30 mg/dL — AB
Specific Gravity, Urine: 1.02 (ref 1.005–1.030)
pH: 7 (ref 5.0–8.0)

## 2011-05-05 NOTE — Progress Notes (Signed)
Swelling in hands and feet at times. Pelvic pressure and pain in legs and lower back. Used interpreter Darletta Moll.

## 2011-05-05 NOTE — Patient Instructions (Signed)
Lavado de manos (Hand Washing) Mantener la salud es importante tanto para usted como para toda su familia. Siga estos simples y econmicos consejos para ayudar a prevenir muchas enfermedades infecciosas. CMO LAVARSE  Moje sus manos y aplique jabn lquido, en barra o en polvo.   Frtese las manos entre s vigorosamente para que se forme espuma, y refriegue todas las superficies. Asegrese de higienizarse entre los dedos y alrededor de las uas.   Contine durante 20 segundos! Se necesita esa cantidad de tiempo para que el jabn y la accin de frotacin logren desprender y remover algunos grmenes "testarudos".   Enjuguese bien las manos bajo el agua.   Squelas utilizando toallas de papel o una secadora de aire.   De ser posible, utilice la toalla de papel para cerrar el grifo.  CUNDO LAVARSE LAS MANOS  Antes de comer.   Antes, durante, y luego de manipular o preparar alimentos.   Luego de entrar en contacto con sangre o fludos corporales (como vmito, secreciones nasales, o saliva). Esto significa que debe lavarse despus de limpiarse la nariz.   Luego de cambiar paales.   Luego de utilizar el bao.   Luego de tocar animales o sus juguetes, correa o deshechos.   Luego de tocar algo que pudo estar contaminado (como el bote de basura, pao de limpieza, el desage o tierra).   Antes de colocar un vendaje, dar medicamentos o colocar lentes de contacto.   Hgalo ms a menudo cuando haya alguien enfermo en su casa.   Siempre que sus manos se vean sucias.  Si no tiene agua y jabn, use un limpiador de manos con alcohol. Mantener las manos limpias es una de las mejores formas de evitar enfermarse y contagiar enfermedades. Limpiando sus manos eliminar los grmenes que obtuvo:  De otras personas.   De las superficies que toca.   De los animales con los que entra en contacto.  Document Released: 10/26/2007 Document Revised: 01/19/2011 ExitCare Patient Information 2012  [employer]. 

## 2011-05-05 NOTE — Progress Notes (Signed)
No complaints or concerns.  Fetal movement and labor precautions reviewed. RTC 1 week. Will talk to SW today about PPBTS, she does not have insurance and wants to be on the waiting list.

## 2011-05-09 ENCOUNTER — Telehealth: Payer: Self-pay | Admitting: Obstetrics and Gynecology

## 2011-05-09 ENCOUNTER — Encounter: Payer: Self-pay | Admitting: Family Medicine

## 2011-05-09 ENCOUNTER — Ambulatory Visit: Payer: Self-pay | Admitting: Family Medicine

## 2011-05-09 VITALS — Wt 172.0 lb

## 2011-05-09 DIAGNOSIS — O0940 Supervision of pregnancy with grand multiparity, unspecified trimester: Secondary | ICD-10-CM

## 2011-05-09 DIAGNOSIS — O9989 Other specified diseases and conditions complicating pregnancy, childbirth and the puerperium: Secondary | ICD-10-CM

## 2011-05-09 DIAGNOSIS — L299 Pruritus, unspecified: Secondary | ICD-10-CM | POA: Insufficient documentation

## 2011-05-09 MED ORDER — HYDROXYZINE PAMOATE 25 MG PO CAPS: 25.0000 mg | ORAL_CAPSULE | ORAL | Status: AC | PRN

## 2011-05-09 NOTE — Telephone Encounter (Signed)
Patient called crying with c/o itch all over her body especially on the palms of her hands and bottom of feet despite her antihistamine Rx. Per Dr. Macon Large she needs to come in and get evaluated. Patient advised and agrees. Appt made today for 3pm.

## 2011-05-09 NOTE — Progress Notes (Signed)
Pregnant patient put in schedule this afternoon under GYN slot due to c/o unbearable itch all over body but especially on the sole of her feet and palms of hand. She reports that she feels her baby move, have irregular contractions, pain on lower back and pressure in pelvic area. No vaginal bleed. Vaginal d/c is white.

## 2011-05-09 NOTE — Progress Notes (Signed)
Itching x 1 week, but worse over the past 4 days.  Worse on palms and soles of feet, but also on body.  Hasn't taken anything.  Improved in shower.  Will check LFTs and bile acids salts.  Will give vistaril.

## 2011-05-10 LAB — COMPREHENSIVE METABOLIC PANEL
Albumin: 3.1 g/dL — ABNORMAL LOW (ref 3.5–5.2)
BUN: 7 mg/dL (ref 6–23)
CO2: 19 mEq/L (ref 19–32)
Calcium: 9 mg/dL (ref 8.4–10.5)
Chloride: 109 mEq/L (ref 96–112)
Creat: 0.48 mg/dL — ABNORMAL LOW (ref 0.50–1.10)
Glucose, Bld: 91 mg/dL (ref 70–99)
Potassium: 3.4 mEq/L — ABNORMAL LOW (ref 3.5–5.3)

## 2011-05-11 ENCOUNTER — Inpatient Hospital Stay (HOSPITAL_COMMUNITY)
Admission: RE | Admit: 2011-05-11 | Discharge: 2011-05-14 | DRG: 775 | Disposition: A | Discharge status: Home / Self Care | Payer: Medicaid Other | Source: Ambulatory Visit | Attending: Obstetrics and Gynecology | Admitting: Obstetrics and Gynecology

## 2011-05-11 ENCOUNTER — Encounter: Payer: Self-pay | Admitting: Obstetrics & Gynecology

## 2011-05-11 ENCOUNTER — Telehealth: Payer: Self-pay | Admitting: *Deleted

## 2011-05-11 ENCOUNTER — Telehealth (HOSPITAL_COMMUNITY): Payer: Self-pay | Admitting: *Deleted

## 2011-05-11 DIAGNOSIS — Z641 Problems related to multiparity: Secondary | ICD-10-CM

## 2011-05-11 DIAGNOSIS — K838 Other specified diseases of biliary tract: Secondary | ICD-10-CM | POA: Diagnosis present

## 2011-05-11 DIAGNOSIS — G5600 Carpal tunnel syndrome, unspecified upper limb: Secondary | ICD-10-CM

## 2011-05-11 DIAGNOSIS — O26619 Liver and biliary tract disorders in pregnancy, unspecified trimester: Secondary | ICD-10-CM

## 2011-05-11 DIAGNOSIS — O99892 Other specified diseases and conditions complicating childbirth: Principal | ICD-10-CM | POA: Diagnosis present

## 2011-05-11 DIAGNOSIS — K831 Obstruction of bile duct: Secondary | ICD-10-CM | POA: Diagnosis present

## 2011-05-11 DIAGNOSIS — O99519 Diseases of the respiratory system complicating pregnancy, unspecified trimester: Secondary | ICD-10-CM

## 2011-05-11 LAB — CBC
HCT: 37.3 % (ref 36.0–46.0)
MCH: 29.9 pg (ref 26.0–34.0)
MCV: 88.4 fL (ref 78.0–100.0)
RBC: 4.22 MIL/uL (ref 3.87–5.11)
WBC: 7.6 10*3/uL (ref 4.0–10.5)

## 2011-05-11 MED ORDER — OXYCODONE-ACETAMINOPHEN 5-325 MG PO TABS: 2.0000 | ORAL_TABLET | ORAL | Status: DC | PRN

## 2011-05-11 MED ORDER — OXYTOCIN BOLUS FROM INFUSION
500.0000 mL | Freq: Once | INTRAVENOUS | Status: DC
  Filled 2011-05-11: qty 1000
  Filled 2011-05-11: qty 500

## 2011-05-11 MED ORDER — TERBUTALINE SULFATE 1 MG/ML IJ SOLN: 0.2500 mg | Freq: Once | INTRAMUSCULAR | Status: AC | PRN

## 2011-05-11 MED ORDER — LACTATED RINGERS IV SOLN
INTRAVENOUS | Status: DC
  Administered 2011-05-11 – 2011-05-12 (×4): via INTRAVENOUS

## 2011-05-11 MED ORDER — ACETAMINOPHEN 325 MG PO TABS: 650.0000 mg | ORAL_TABLET | ORAL | Status: DC | PRN

## 2011-05-11 MED ORDER — OXYTOCIN 20 UNITS IN LACTATED RINGERS INFUSION - SIMPLE
1.0000 m[IU]/min | INTRAVENOUS | Status: DC
  Administered 2011-05-11: 2 m[IU]/min via INTRAVENOUS
  Filled 2011-05-11: qty 1000

## 2011-05-11 MED ORDER — ONDANSETRON HCL 4 MG/2ML IJ SOLN: 4.0000 mg | Freq: Four times a day (QID) | INTRAMUSCULAR | Status: DC | PRN

## 2011-05-11 MED ORDER — NALBUPHINE SYRINGE 5 MG/0.5 ML: 10.0000 mg | INJECTION | INTRAMUSCULAR | Status: DC | PRN

## 2011-05-11 MED ORDER — LIDOCAINE HCL (PF) 1 % IJ SOLN
30.0000 mL | INTRAMUSCULAR | Status: DC | PRN
  Filled 2011-05-11: qty 30

## 2011-05-11 MED ORDER — FLEET ENEMA 7-19 GM/118ML RE ENEM: 1.0000 | ENEMA | RECTAL | Status: DC | PRN

## 2011-05-11 MED ORDER — LACTATED RINGERS IV SOLN
500.0000 mL | INTRAVENOUS | Status: DC | PRN
  Administered 2011-05-12: 500 mL via INTRAVENOUS

## 2011-05-11 MED ORDER — OXYTOCIN 20 UNITS IN LACTATED RINGERS INFUSION - SIMPLE
125.0000 mL/h | Freq: Once | INTRAVENOUS | Status: AC
  Administered 2011-05-12: 125 mL/h via INTRAVENOUS

## 2011-05-11 MED ORDER — CITRIC ACID-SODIUM CITRATE 334-500 MG/5ML PO SOLN: 30.0000 mL | ORAL | Status: DC | PRN

## 2011-05-11 NOTE — Telephone Encounter (Signed)
Preadmission screen  

## 2011-05-11 NOTE — H&P (Signed)
I have seen and examined this patient in conjunction with Dr Durene Cal, PGY1.  I have taken this history and performed the exam.  I agree with the note as written above and have made corrections as needed.   Meghan Mclean 05/11/2011 11:32 PM

## 2011-05-11 NOTE — Telephone Encounter (Signed)
Message copied by Jill Side on Wed May 11, 2011  2:46 PM ------      Message from: Mayra Neer P      Created: Wed May 11, 2011 10:56 AM       Would you mind taking care of this please?  Thanks,      Tresa Endo      ----- Message -----         From: Brooke Pace, DO         Sent: 05/11/2011  10:12 AM           To: Juliette Mangle, RN            Case was discussed with Dr Judeth Porch MFM who agree with induction of labor for concern of cholestasis given pruritis and elevated bile acids (from 5 to 18).  Patient to be called with results and scheduled for induction soon.

## 2011-05-11 NOTE — Progress Notes (Signed)
Quick Note:  Case was discussed with Dr Judeth Porch MFM who agree with induction of labor for concern of cholestasis given pruritis and elevated bile acids (from 5 to 18). Patient to be called with results and scheduled for induction soon. ______

## 2011-05-11 NOTE — H&P (Signed)
Meghan Mclean 35 y.o. female  [id number] at [redacted]w[redacted]d presenting for IOL for intrahepatic cholestasis of pregnancy. Patient of [facility].   Patient reports itching all over for several weeks with worse itching in her palms and soles, recently has improved with vistaril. Contractions about once an hour not very painful. Patient seen Monday and had bile acids of 18.  Patient denies decreased fetal movement/discharge/blood from vagina/rush of fluid.   History OB History    Grav Para Term Preterm Abortions TAB SAB Ect Mult Living   8 6 5 1 1  1  0 0 6     Past Medical History  Diagnosis Date  . Asthma 2007   No past surgical history on file. Family History: family history is not on file. Social History:  reports that she has never smoked. She has never used smokeless tobacco. She reports that she does not drink alcohol or use illicit drugs.  ROS negative except as noted in HPI    Dilation: 3 Effacement (%): 50 Station: -2 Exam by:: Marleni Gallardo Blood pressure 111/72, pulse 77, temperature 98.9 F (37.2 C), temperature source Oral, resp. rate 18, height 5' (1.524 m), weight 72.576 kg (160 lb), last menstrual period 08/06/2010.   Fetal Exam Fetal Monitor Review: Baseline rate: 140.  Variability: moderate (6-25 bpm).   Pattern: accelerations present and no decelerations.    Fetal State Assessment: Category I - tracings are normal.     Physical Exam  Constitutional: She is oriented to person, place, and time. She appears well-developed and well-nourished. No distress.       No jaundice noted.    Cardiovascular: Normal rate and regular rhythm.  Exam reveals no gallop and no friction rub.   No murmur heard. Respiratory: Effort normal and breath sounds normal. No respiratory distress. She has no wheezes. She has no rales. She exhibits no tenderness.  GI:       Gravd. Size consistent with dates.   Genitourinary: Vagina normal.  Musculoskeletal: Normal range of motion. She exhibits no  edema.  Neurological: She is alert and oriented to person, place, and time.    Prenatal labs: ABO, Rh: O/Positive/-- (07/16 0000) Antibody: Negative (07/16 0000) Rubella: Immune (07/16 0000) RPR: NON REAC (10/04 1040)  HBsAg: Negative (07/16 0000)  HIV: NON REACTIVE (10/04 1040)  GBS:   negative  1 hour glucola 123.  EFW 2585g (82%) at 33.4 on 04/08/11  Assessment/Plan: #1 [id number] at [redacted]w[redacted]d #2 IOL for intrahepatic cholestasis of pregnancy #3 Asthma #4 Reassuring FHT  Admit for IOL. GIven bishop score of 6 and cervix dilated to 3cm will start pitocin 2x2. Expectant management for NSVD.  Will continue asthma medications and vistaril for pruritis.   Case discussed with Candelaria Celeste, DO  Antoinetta Berrones 05/11/2011, 9:26 PM

## 2011-05-11 NOTE — Telephone Encounter (Signed)
Called pt with [facility]- interpreter. Pt was informed of recommendation for IOL tonight @ 7:00 pm  Pt agreed, voiced understanding and had no questions.

## 2011-05-12 ENCOUNTER — Encounter (HOSPITAL_COMMUNITY): Payer: Self-pay

## 2011-05-12 ENCOUNTER — Inpatient Hospital Stay (HOSPITAL_COMMUNITY): Payer: Medicaid Other | Admitting: Anesthesiology

## 2011-05-12 ENCOUNTER — Encounter (HOSPITAL_COMMUNITY): Payer: Self-pay | Admitting: Anesthesiology

## 2011-05-12 DIAGNOSIS — O26649 Intrahepatic cholestasis of pregnancy, unspecified trimester: Secondary | ICD-10-CM | POA: Diagnosis present

## 2011-05-12 DIAGNOSIS — K831 Obstruction of bile duct: Secondary | ICD-10-CM | POA: Diagnosis present

## 2011-05-12 DIAGNOSIS — K838 Other specified diseases of biliary tract: Secondary | ICD-10-CM

## 2011-05-12 DIAGNOSIS — O9989 Other specified diseases and conditions complicating pregnancy, childbirth and the puerperium: Secondary | ICD-10-CM

## 2011-05-12 MED ORDER — LORATADINE 10 MG PO TABS
10.0000 mg | ORAL_TABLET | Freq: Every day | ORAL | Status: DC
  Administered 2011-05-12 – 2011-05-14 (×3): 10 mg via ORAL
  Filled 2011-05-12 (×3): qty 1

## 2011-05-12 MED ORDER — ALBUTEROL SULFATE HFA 108 (90 BASE) MCG/ACT IN AERS
2.0000 | INHALATION_SPRAY | Freq: Four times a day (QID) | RESPIRATORY_TRACT | Status: DC | PRN
  Filled 2011-05-12: qty 6.7

## 2011-05-12 MED ORDER — ZOLPIDEM TARTRATE 5 MG PO TABS: 5.0000 mg | ORAL_TABLET | Freq: Every evening | ORAL | Status: DC | PRN

## 2011-05-12 MED ORDER — LACTATED RINGERS IV SOLN: 500.0000 mL | Freq: Once | INTRAVENOUS | Status: DC

## 2011-05-12 MED ORDER — LANOLIN HYDROUS EX OINT: TOPICAL_OINTMENT | CUTANEOUS | Status: DC | PRN

## 2011-05-12 MED ORDER — DIPHENHYDRAMINE HCL 50 MG/ML IJ SOLN
12.5000 mg | INTRAMUSCULAR | Status: DC | PRN
Start: 2011-05-12 — End: 2011-05-12

## 2011-05-12 MED ORDER — ANIMAL SHAPES WITH C & FA PO CHEW
2.0000 | CHEWABLE_TABLET | Freq: Every day | ORAL | Status: DC
  Administered 2011-05-12 – 2011-05-14 (×3): 2 via ORAL
  Filled 2011-05-12 (×3): qty 2

## 2011-05-12 MED ORDER — LIDOCAINE HCL 1.5 % IJ SOLN
INTRAMUSCULAR | Status: DC | PRN
  Administered 2011-05-12 (×2): 5 mL via EPIDURAL

## 2011-05-12 MED ORDER — MONTELUKAST SODIUM 10 MG PO TABS
10.0000 mg | ORAL_TABLET | Freq: Every day | ORAL | Status: DC
  Administered 2011-05-13 (×2): 10 mg via ORAL
  Filled 2011-05-12 (×2): qty 1

## 2011-05-12 MED ORDER — EPHEDRINE 5 MG/ML INJ: 10.0000 mg | INTRAVENOUS | Status: DC | PRN

## 2011-05-12 MED ORDER — WITCH HAZEL-GLYCERIN EX PADS: 1.0000 "application " | MEDICATED_PAD | CUTANEOUS | Status: DC | PRN

## 2011-05-12 MED ORDER — FLUTICASONE-SALMETEROL 100-50 MCG/DOSE IN AEPB
1.0000 | INHALATION_SPRAY | Freq: Two times a day (BID) | RESPIRATORY_TRACT | Status: DC
  Administered 2011-05-12 – 2011-05-14 (×4): 1 via RESPIRATORY_TRACT
  Filled 2011-05-12: qty 14

## 2011-05-12 MED ORDER — SIMETHICONE 80 MG PO CHEW: 80.0000 mg | CHEWABLE_TABLET | ORAL | Status: DC | PRN

## 2011-05-12 MED ORDER — DIPHENHYDRAMINE HCL 25 MG PO CAPS: 25.0000 mg | ORAL_CAPSULE | Freq: Four times a day (QID) | ORAL | Status: DC | PRN

## 2011-05-12 MED ORDER — MISOPROSTOL 200 MCG PO TABS
ORAL_TABLET | ORAL | Status: AC
  Filled 2011-05-12: qty 5

## 2011-05-12 MED ORDER — HYDROXYZINE PAMOATE 25 MG PO CAPS
25.0000 mg | ORAL_CAPSULE | ORAL | Status: DC | PRN
  Filled 2011-05-12: qty 1

## 2011-05-12 MED ORDER — SENNOSIDES-DOCUSATE SODIUM 8.6-50 MG PO TABS
2.0000 | ORAL_TABLET | Freq: Every day | ORAL | Status: DC
  Administered 2011-05-12 – 2011-05-13 (×2): 2 via ORAL

## 2011-05-12 MED ORDER — BENZOCAINE-MENTHOL 20-0.5 % EX AERO: 1.0000 "application " | INHALATION_SPRAY | CUTANEOUS | Status: DC | PRN

## 2011-05-12 MED ORDER — MISOPROSTOL 200 MCG PO TABS
1000.0000 ug | ORAL_TABLET | Freq: Once | ORAL | Status: AC
  Administered 2011-05-12: 1000 ug via VAGINAL

## 2011-05-12 MED ORDER — ONDANSETRON HCL 4 MG/2ML IJ SOLN: 4.0000 mg | INTRAMUSCULAR | Status: DC | PRN

## 2011-05-12 MED ORDER — FENTANYL 2.5 MCG/ML BUPIVACAINE 1/10 % EPIDURAL INFUSION (WH - ANES)
INTRAMUSCULAR | Status: DC | PRN
  Administered 2011-05-12: 14 mL/h via EPIDURAL

## 2011-05-12 MED ORDER — ONDANSETRON HCL 4 MG PO TABS: 4.0000 mg | ORAL_TABLET | ORAL | Status: DC | PRN

## 2011-05-12 MED ORDER — PHENYLEPHRINE 40 MCG/ML (10ML) SYRINGE FOR IV PUSH (FOR BLOOD PRESSURE SUPPORT): 80.0000 ug | PREFILLED_SYRINGE | INTRAVENOUS | Status: DC | PRN

## 2011-05-12 MED ORDER — TETANUS-DIPHTH-ACELL PERTUSSIS 5-2.5-18.5 LF-MCG/0.5 IM SUSP
0.5000 mL | Freq: Once | INTRAMUSCULAR | Status: AC
  Administered 2011-05-14: 0.5 mL via INTRAMUSCULAR
  Filled 2011-05-12: qty 0.5

## 2011-05-12 MED ORDER — PHENYLEPHRINE 40 MCG/ML (10ML) SYRINGE FOR IV PUSH (FOR BLOOD PRESSURE SUPPORT)
80.0000 ug | PREFILLED_SYRINGE | INTRAVENOUS | Status: DC | PRN
  Filled 2011-05-12: qty 5

## 2011-05-12 MED ORDER — DIBUCAINE 1 % RE OINT: 1.0000 "application " | TOPICAL_OINTMENT | RECTAL | Status: DC | PRN

## 2011-05-12 MED ORDER — EPHEDRINE 5 MG/ML INJ
10.0000 mg | INTRAVENOUS | Status: DC | PRN
  Filled 2011-05-12: qty 4

## 2011-05-12 MED ORDER — OXYCODONE-ACETAMINOPHEN 5-325 MG PO TABS
1.0000 | ORAL_TABLET | ORAL | Status: DC | PRN
  Filled 2011-05-12: qty 1

## 2011-05-12 MED ORDER — FENTANYL 2.5 MCG/ML BUPIVACAINE 1/10 % EPIDURAL INFUSION (WH - ANES)
14.0000 mL/h | INTRAMUSCULAR | Status: DC
  Administered 2011-05-12 (×3): 14 mL/h via EPIDURAL
  Filled 2011-05-12 (×4): qty 60

## 2011-05-12 NOTE — Anesthesia Postprocedure Evaluation (Signed)
Anesthesia Post Note  Patient: Meghan Mclean  Procedure(s) Performed: * No procedures listed *  Anesthesia type: Epidural  Patient location: Mother/Baby  Post pain: Pain level controlled  Post assessment: Post-op Vital signs reviewed  Last Vitals:  Filed Vitals:   05/12/11 1848  BP: 123/80  Pulse: 95  Temp:   Resp: 18    Post vital signs: Reviewed  Level of consciousness: awake  Complications: No apparent anesthesia complications

## 2011-05-12 NOTE — Progress Notes (Signed)
Meghan Mclean is a 35 y.o. [id number] at [redacted]w[redacted]d   Subjective: Increased pelvic pressure.  Objective: BP 122/84  Pulse 84  Temp(Src) 99 F (37.2 C) (Oral)  Resp 18  Ht 5' (1.524 m)  Wt 72.576 kg (160 lb)  BMI 31.25 kg/m2  LMP 08/06/2010      FHT:  FHR: 140 bpm, variability: moderate,  accelerations:  Present,  decelerations:  Present early and mild variable decels UC:   regular, every 2-3 minutes SVE:   Dilation: 7 Effacement (%): 100 Station: -2 Exam by:: Tressia Danas RN  Labs:  Assessment / Plan: Spontaneous labor, progressing normally  Labor: Progressing normally Preeclampsia:  NA Fetal Wellbeing:  Category II Pain Control:  Epidural I/D:  n/a Anticipated MOD:  NSVD  Meghan Mclean 05/12/2011, 4:15 PM

## 2011-05-12 NOTE — Progress Notes (Signed)
Cervix very posterior

## 2011-05-12 NOTE — Progress Notes (Signed)
Sw referral received for assistance with medication assistance.  Sw will inform case manager of possible needs.

## 2011-05-12 NOTE — Anesthesia Procedure Notes (Signed)
Epidural Patient location during procedure: OB Start time: 05/12/2011 3:48 AM End time: 05/12/2011 3:54 AM Reason for block: procedure for pain  Staffing Anesthesiologist: Sandrea Hughs Performed by: anesthesiologist   Preanesthetic Checklist Completed: patient identified, site marked, surgical consent, pre-op evaluation, timeout performed, IV checked, risks and benefits discussed and monitors and equipment checked  Epidural Patient position: sitting Prep: site prepped and draped and DuraPrep Patient monitoring: continuous pulse ox and blood pressure Approach: midline Injection technique: LOR air  Needle:  Needle type: Tuohy  Needle gauge: 17 G Needle length: 9 cm Needle insertion depth: 5 cm cm Catheter type: closed end flexible Catheter size: 19 Gauge Catheter at skin depth: 10 cm Test dose: negative and 1.5% lidocaine  Assessment Sensory level: T8 Events: blood not aspirated, injection not painful, no injection resistance, negative IV test and no paresthesia

## 2011-05-12 NOTE — Progress Notes (Signed)
Meghan Mclean is a 35 y.o. [id number] at [redacted]w[redacted]d admitted for induction of labor due to cholestasis of pregnancy per MFM.  Subjective:   Objective: BP 109/82  Pulse 82  Temp(Src) 98.7 F (37.1 C) (Oral)  Resp 18  Ht 5' (1.524 m)  Wt 72.576 kg (160 lb)  BMI 31.25 kg/m2  LMP 08/06/2010      FHT:  FHR: 140 bpm, variability: moderate,  accelerations:  Present,  decelerations:  Absent UC:   regular, every 2-5 minutes. Pitocin on 24 milliUnits/min SVE:   Dilation: 6 Effacement (%): 50 Station: -1 Exam by:: Meghan Mclean, CNM  Labs:  Assessment / Plan: Induction of labor due to cholestasis of pregnancy,  progressing well on pitocin  Labor: Progressing slowly on Pitocin with inadequate labor pattern. AROM'd large amount of clear fluid. Continue increasing pitocin PRN to achieve adequate pattern. IUPC PRN at next check. Preeclampsia:  NA Fetal Wellbeing:  Category I Pain Control:  Epidural I/D:  n/a Anticipated MOD:  NSVD  Taffy Delconte 05/12/2011, 11:39 AM

## 2011-05-12 NOTE — Progress Notes (Signed)
Meghan Mclean is a 35 y.o. [id number] at [redacted]w[redacted]d admitted for IOL for cholestasis.  Subjective: Fairly comfortable with epidural.  Still feeling ctx, but pain controlled. Feeling some pressure with ctx.   Objective: BP 118/72  Pulse 78  Temp(Src) 98.1 F (36.7 C) (Oral)  Resp 18  Ht 5' (1.524 m)  Wt 72.576 kg (160 lb)  BMI 31.25 kg/m2  LMP 08/06/2010      FHT:  FHR: 140 bpm, variability: moderate,  accelerations:  Present,  decelerations:  Absent UC:   irregular, every 3-4 minutes SVE:   Dilation 5 Effacement: 50% Station: -2  Labs: Lab Results  Component Value Date   WBC 7.6 05/11/2011   HGB 12.6 05/11/2011   HCT 37.3 05/11/2011   MCV 88.4 05/11/2011   PLT 127* 05/11/2011    Assessment / Plan: Induction of labor due to cholestasis,  progressing on pitocin.  May need IUPC.   Labor: Progressing on Pitocin, will continue to increase Preeclampsia:  n/a Fetal Wellbeing:  Category I Pain Control:  Epidural I/D:  n/a Anticipated MOD:  NSVD  O'Grady, Cheronda Erck 05/12/2011, 9:10 AM

## 2011-05-12 NOTE — Progress Notes (Signed)
Patient ID: Meghan Mclean, female   DOB: [birth year], 35 y.o.   MRN: [id number]   Assumed care of the patient and completed chart review.  Outgoing team Emelda Fear and Adrian Blackwater confirmed MFM consult was obtained for delivery.  Given worsening symptoms, increasing bile acids, and increased risk of IUFD with cholestasis of pregnancy, MFM recommended induction.  Will continue current management for now.  [name] 9:44 AM

## 2011-05-12 NOTE — Progress Notes (Addendum)
Meghan Mclean is a 35 y.o. [id number] at [redacted]w[redacted]d  admitted for induction of labor due to intrahepatic cholestasis of pregnancy.  Subjective: 4/10 pain. Slight discomfort with contractions.   Objective: BP 113/73  Pulse 81  Temp(Src) 98.9 F (37.2 C) (Oral)  Resp 18  Ht 5' (1.524 m)  Wt 72.576 kg (160 lb)  BMI 31.25 kg/m2  LMP 08/06/2010      FHT:  FHR: 160 bpm, variability: moderate,  accelerations:  Present,  decelerations:  Absent UC:   irregular, every 2-5 minutes SVE:   Dilation: 3 Effacement (%): 50 Station: -2 Exam by:: Matthew Cina  Labs: Lab Results  Component Value Date   WBC 7.6 05/11/2011   HGB 12.6 05/11/2011   HCT 37.3 05/11/2011   MCV 88.4 05/11/2011   PLT 127* 05/11/2011    Assessment / Plan: Induction of labor due to cholestasis of pregnancy,  progressing well on pitocin  Labor: Progressing on Pitocin, will continue to increase then AROM. On 6 milliunits/min Fetal Wellbeing:  Category I Pain Control:  Labor support without medications I/D:  n/a Anticipated MOD:  NSVD  Zavien Clubb 05/12/2011, 12:05 AM

## 2011-05-12 NOTE — Progress Notes (Signed)
Meghan Mclean is a 35 y.o. [id number] at 79w0d3 admitted for induction of labor due to cholestasis.  Subjective: More comfortable s/p epidural  Objective: BP 114/79  Pulse 81  Temp(Src) 99.2 F (37.3 C) (Oral)  Resp 18  Ht 5' (1.524 m)  Wt 72.576 kg (160 lb)  BMI 31.25 kg/m2  LMP 08/06/2010      FHT:  FHR: 130 bpm, variability: moderate,  accelerations:  Present,  decelerations:  Absent UC:   regular, every 2-3 minutes SVE:   Dilation: 1.5 Effacement (%): 60 Station: -3 Exam by:: B.Smith RN   Labs: Lab Results  Component Value Date   WBC 7.6 05/11/2011   HGB 12.6 05/11/2011   HCT 37.3 05/11/2011   MCV 88.4 05/11/2011   PLT 127* 05/11/2011    Assessment / Plan: Induction of labor due to cholestasis. Fetus has moved down somewhat and internal os is now able to be felt. Cervical os internally only 1.5. Discussed with Dr. Adrian Blackwater and we will allow patient to continue on pitocin for now. If patient does not make more change, we may allow pitocin to go off, allow patient to slow down on contractions, then start Cytotec but will discuss with team  Fetal Wellbeing:  Category I Pain Control:  Epidural I/D:  n/a Anticipated MOD:  NSVD  HUNTER, STEPHEN 05/12/2011, 5:43 AM

## 2011-05-12 NOTE — Progress Notes (Signed)
[facility] Department of Clinical Social Work Documentation of Interpretation   I assisted ____Faculty Practice_______________ with interpretation of ____plan of care__________________ for this patient. 

## 2011-05-12 NOTE — Progress Notes (Addendum)
Meghan Mclean is a 35 y.o. [id number] at [redacted]w[redacted]d byadmitted for induction of labor due to intrahepatic cholestasis of pregnancy.  Subjective: Feeling more pressure. Requesting epidural now.   Objective: BP 124/75  Pulse 88  Temp(Src) 99.2 F (37.3 C) (Oral)  Resp 18  Ht 5' (1.524 m)  Wt 72.576 kg (160 lb)  BMI 31.25 kg/m2  LMP 08/06/2010      FHT:  FHR: 140 bpm, variability: moderate,  accelerations:  Present,  decelerations:  Absent UC:   regular, every 2-3 minutes SVE:   Dilation: 3 Effacement (%): 50 Station: -3 Exam by:: Hunter Unchanged from previous exam.   Labs: Lab Results  Component Value Date   WBC 7.6 05/11/2011   HGB 12.6 05/11/2011   HCT 37.3 05/11/2011   MCV 88.4 05/11/2011   PLT 127* 05/11/2011    Assessment / Plan: Induction of labor due to cholestasis,  Continuing to titrate pitocin. Now 12 milliunits/minute  Labor: Progressing on Pitocin, will continue to increase then AROM Fetal Wellbeing:  Category I Pain Control:  Labor support without medications-now requesting epidural.  I/D:  n/a Anticipated MOD:  NSVD  HUNTER, STEPHEN 05/12/2011, 2:32 AM

## 2011-05-12 NOTE — Progress Notes (Signed)
Delivery of live viable female by Dr Maren Reamer, assisted by Ivonne Andrew, CNM

## 2011-05-12 NOTE — Progress Notes (Signed)
Can reach inner os now. Outer 3, inner 1.5

## 2011-05-12 NOTE — Anesthesia Preprocedure Evaluation (Signed)
Anesthesia Evaluation  Patient identified by MRN, date of birth, ID band Patient awake    Reviewed: Allergy & Precautions, H&P , NPO status , Patient's Chart, lab work & pertinent test results  Airway Mallampati: I TM Distance: >3 FB Neck ROM: full    Dental No notable dental hx.    Pulmonary asthma ,  clear to auscultation  Pulmonary exam normal       Cardiovascular neg cardio ROS     Neuro/Psych PSYCHIATRIC DISORDERS    GI/Hepatic negative GI ROS, Neg liver ROS,   Endo/Other  Negative Endocrine ROS  Renal/GU negative Renal ROS  Genitourinary negative   Musculoskeletal negative musculoskeletal ROS (+)   Abdominal Normal abdominal exam  (+)   Peds negative pediatric ROS (+)  Hematology negative hematology ROS (+)   Anesthesia Other Findings   Reproductive/Obstetrics (+) Pregnancy                           Anesthesia Physical Anesthesia Plan  ASA: II  Anesthesia Plan: Epidural   Post-op Pain Management:    Induction:   Airway Management Planned:   Additional Equipment:   Intra-op Plan:   Post-operative Plan:   Informed Consent: I have reviewed the patients History and Physical, chart, labs and discussed the procedure including the risks, benefits and alternatives for the proposed anesthesia with the patient or authorized representative who has indicated his/her understanding and acceptance.     Plan Discussed with:   Anesthesia Plan Comments:         Anesthesia Quick Evaluation

## 2011-05-12 NOTE — Progress Notes (Signed)
[facility] Department of Clinical Social Work Documentation of Interpretation   I assisted ___________________ with interpretation of _stopped by to check on patient_____________________ for this patient. 

## 2011-05-13 MED ORDER — ACETAMINOPHEN 325 MG PO TABS
650.0000 mg | ORAL_TABLET | Freq: Four times a day (QID) | ORAL | Status: DC | PRN
  Administered 2011-05-13: 650 mg via ORAL
  Administered 2011-05-13 – 2011-05-14 (×2): 325 mg via ORAL
  Filled 2011-05-13: qty 2
  Filled 2011-05-13: qty 1
  Filled 2011-05-13: qty 2

## 2011-05-13 MED ORDER — BENZOCAINE-MENTHOL 20-0.5 % EX AERO
INHALATION_SPRAY | CUTANEOUS | Status: AC
  Administered 2011-05-13: 09:00:00
  Filled 2011-05-13: qty 56

## 2011-05-13 NOTE — Anesthesia Postprocedure Evaluation (Signed)
  Anesthesia Post-op Note  Patient: Meghan Mclean  Procedure(s) Performed: * No procedures listed *  Patient Location: Mother/Baby  Anesthesia Type: Epidural  Level of Consciousness: alert  and oriented  Airway and Oxygen Therapy: Patient Spontanous Breathing  Post-op Pain: mild  Post-op Assessment: Patient's Cardiovascular Status Stable  Post-op Vital Signs: stable  Complications: No apparent anesthesia complications

## 2011-05-13 NOTE — Progress Notes (Signed)
Post Partum Day 1 Subjective: up ad lib, voiding, tolerating PO and + flatus, is having some pain. Not taking motrin b/c of allergy. Informed pt that she has both tylenol and percocet available to her.   Objective: Blood pressure 113/78, pulse 96, temperature 98.9 F (37.2 C), temperature source Oral, resp. rate 22, height 5' (1.524 m), weight 72.576 kg (160 lb), last menstrual period 08/06/2010, SpO2 95.00%, unknown if currently breastfeeding.  Physical Exam:  General: alert, cooperative and no distress Lochia: appropriate Uterine Fundus: firm Incision: n/a DVT Evaluation: No cords or calf tenderness. No significant calf/ankle edema.   Basename 05/11/11 2045  HGB 12.6  HCT 37.3    Assessment/Plan: Plan for discharge tomorrow and  breast and bottle feeding.    LOS: 2 days   Levada Schilling 05/13/2011, 10:15 AM

## 2011-05-13 NOTE — Addendum Note (Signed)
Addendum  created 05/13/11 0809 by Fanny Dance   Modules edited:Notes Section

## 2011-05-13 NOTE — Progress Notes (Signed)
UR chart review completed.  

## 2011-05-13 NOTE — Progress Notes (Signed)
Wet, productive cough noted during assessment. No wheezing noted, denies SOB.

## 2011-05-14 MED ORDER — FLUTICASONE-SALMETEROL 100-50 MCG/DOSE IN AEPB: 1.0000 | INHALATION_SPRAY | Freq: Two times a day (BID) | RESPIRATORY_TRACT | Status: DC

## 2011-05-14 MED ORDER — ALBUTEROL SULFATE HFA 108 (90 BASE) MCG/ACT IN AERS: 2.0000 | INHALATION_SPRAY | Freq: Four times a day (QID) | RESPIRATORY_TRACT | Status: DC | PRN

## 2011-05-14 MED ORDER — MONTELUKAST SODIUM 10 MG PO TABS: 10.0000 mg | ORAL_TABLET | Freq: Every day | ORAL | Status: DC

## 2011-05-14 MED ORDER — ACETAMINOPHEN 325 MG PO TABS: 650.0000 mg | ORAL_TABLET | Freq: Four times a day (QID) | ORAL | Status: AC | PRN

## 2011-05-14 MED ORDER — FLINTSTONES COMPLETE 60 MG PO CHEW: 2.0000 | CHEWABLE_TABLET | Freq: Every day | ORAL | Status: AC

## 2011-05-14 MED ORDER — PNEUMOCOCCAL 13-VAL CONJ VACC IM SUSP: 0.5000 mL | INTRAMUSCULAR | Status: DC

## 2011-05-14 MED ORDER — GUAIFENESIN ER 600 MG PO TB12
600.0000 mg | ORAL_TABLET | Freq: Two times a day (BID) | ORAL | Status: DC
  Administered 2011-05-14: 600 mg via ORAL
  Filled 2011-05-14: qty 1

## 2011-05-14 MED ORDER — PNEUMOCOCCAL VAC POLYVALENT 25 MCG/0.5ML IJ INJ: 0.5000 mL | INJECTION | INTRAMUSCULAR | Status: DC

## 2011-05-14 MED ORDER — PNEUMOCOCCAL VAC POLYVALENT 25 MCG/0.5ML IJ INJ
0.5000 mL | INJECTION | Freq: Once | INTRAMUSCULAR | Status: AC
  Administered 2011-05-14: 0.5 mL via INTRAMUSCULAR
  Filled 2011-05-14: qty 0.5

## 2011-05-14 NOTE — Discharge Summary (Signed)
Obstetric Discharge Summary Reason for Admission: induction of labor Prenatal Procedures: NST Intrapartum Procedures: spontaneous vaginal delivery Postpartum Procedures: none Complications-Operative and Postpartum: none Hemoglobin  Date Value Range Status  05/11/2011 12.6  12.0-15.0 (g/dL) Final     HCT  Date Value Range Status  05/11/2011 37.3  36.0-46.0 (%) Final    Discharge Diagnoses: Term Pregnancy-delivered Meghan Mclean 35 y.o. female  [phone] who presented at [redacted]w[redacted]d for induction of labor for intrahepatic cholestasis of pregnancy who delivered a viable female  via Vaginal, Spontaneous Delivery (Presentation: Left Occiput Anterior).  Nuchal cord x1, delivered through cord. APGAR: 6, 9; weight 8 lb 2.7 oz (3705 g).   Placenta status: Intact, Spontaneous.  Pt had moderate bleeding after delivery of placenta and so received cytotec to good effect.  Cord: 3 vessels with the following complications: None.  Anesthesia: Epidural Est. Blood Loss (mL): 500  Discharge Information: Date: 05/14/2011 Activity: pelvic rest Diet: routine Medications: PNV and tylenol. Asthma medications also prescribed. May resume benadryl prn Condition: stable Instructions: refer to practice specific booklet Discharge to: home Follow-up Information    Follow up with [facility] [place]. Make an appointment in 4 weeks.   Contact information:   [address] [employer] [place] [address]          Newborn Data: Live born female  Birth Weight: 8 lb 2.7 oz (3705 g) APGAR: 6, 9  Home with mother.  [name] 05/14/2011, 8:15 AM

## 2011-05-14 NOTE — Progress Notes (Signed)
Post Partum Day 2, IOL for cholestasis Subjective: up ad lib, voiding, tolerating PO, + flatus and complains of no itching, but hands "feel like they are asleep" and some mild pain  Objective: Blood pressure 112/73, pulse 93, temperature 98.6 F (37 C), temperature source Oral, resp. rate 20, height 5' (1.524 m), weight 72.576 kg (160 lb), last menstrual period 08/06/2010, SpO2 95.00%, unknown if currently breastfeeding.  Physical Exam:  General: alert, cooperative and no distress Lochia: appropriate Uterine Fundus: firm DVT Evaluation: No cords or calf tenderness. Calf/Ankle edema is present 1+   Basename 05/11/11 2045  HGB 12.6  HCT 37.3    Assessment/Plan: Discharge home, Breastfeeding and Contraception -eventually vasectomy. no cirucmcision.    LOS: 3 days   Meghan Mclean 05/14/2011, 8:06 AM

## 2011-05-15 NOTE — Discharge Summary (Signed)
Agree with above note.  Isaish Alemu 05/15/2011 7:47 AM   

## 2011-05-19 NOTE — Progress Notes (Signed)
This encounter was created in error - please disregard.

## 2011-06-17 ENCOUNTER — Encounter: Payer: Self-pay | Admitting: Physician Assistant

## 2011-06-17 ENCOUNTER — Ambulatory Visit (INDEPENDENT_AMBULATORY_CARE_PROVIDER_SITE_OTHER): Payer: Self-pay | Admitting: Physician Assistant

## 2011-06-17 DIAGNOSIS — J45909 Unspecified asthma, uncomplicated: Secondary | ICD-10-CM

## 2011-06-17 DIAGNOSIS — O99893 Other specified diseases and conditions complicating puerperium: Secondary | ICD-10-CM

## 2011-06-17 MED ORDER — ALBUTEROL SULFATE HFA 108 (90 BASE) MCG/ACT IN AERS: 2.0000 | INHALATION_SPRAY | Freq: Four times a day (QID) | RESPIRATORY_TRACT | Status: DC | PRN

## 2011-06-17 MED ORDER — FLUTICASONE-SALMETEROL 100-50 MCG/DOSE IN AEPB: 1.0000 | INHALATION_SPRAY | Freq: Two times a day (BID) | RESPIRATORY_TRACT | Status: DC

## 2011-06-17 MED ORDER — MONTELUKAST SODIUM 10 MG PO TABS: 10.0000 mg | ORAL_TABLET | Freq: Every day | ORAL | Status: DC

## 2011-06-17 NOTE — Progress Notes (Signed)
Chief Complaint:  Postpartum Care   Meghan Mclean is  36 y.o. [id number].  No LMP recorded..  She presents complaining of Postpartum Care  SVD on 05/11/11 without complication. Breastfeeding w/o difficulty. Edinburgh score: 0. Intercourse not resumed. Bleeding stopped 2 weeks ago.  Obstetrical/Gynecological History: OB History    Grav Para Term Preterm Abortions TAB SAB Ect Mult Living   8 7 6 1 1  1  0 0 6      Past Medical History: Past Medical History  Diagnosis Date  . Asthma 2007    Past Surgical History: No past surgical history on file.  Family History: No family history on file.  Social History: History  Substance Use Topics  . Smoking status: Never Smoker   . Smokeless tobacco: Never Used  . Alcohol Use: No    Allergies:  Allergies  Allergen Reactions  . Aspirin Other (See Comments)    "Athsma attack"  . Ibuprofen     REACTION: asthma trigger    Review of Systems - Negative except what has been reviewed in the HPI  Physical Exam   Blood pressure 104/71, pulse 71, temperature 97.4 F (36.3 C), temperature source Oral, weight 148 lb 4.8 oz (67.268 kg), currently breastfeeding.  General: General appearance - alert, well appearing, and in no distress Mental status - alert, oriented to person, place, and time, normal mood, behavior, speech, dress, motor activity, and thought processes Breasts - breasts appear normal, no suspicious masses, no skin or nipple changes or axillary nodes, lactating Focused Gynecological Exam: examination not indicated   Assessment: 4 weeks s/p SVD - breastfeeding Patient Active Problem List  Diagnoses  . ANEMIA  . PICA  . ALLERGIC RHINITIS  . BOILS, RECURRENT  . Headache  . Asthma  . Grand multipara  . Carpal tunnel syndrome  . Maternal asthma complicating pregnancy  . Itching  . Pregnancy, supervision, high-risk  . Itching  . Intrahepatic cholestasis of pregnancy    Plan: Rx given for refills of Asthma  medications Planning vasectomy: Pt referred to [place], SW. Condoms given in the meantime. Pap due: 2014   Meghan Mclean E. 06/17/2011,10:11 AM

## 2011-06-17 NOTE — Patient Instructions (Signed)
Eleccin del mtodo anticonceptivo (Birth Recruitment consultant) Los anticonceptivos son mtodos, prcticas o dispositivos para evitar que se Facilities manager en una mujer sexualmente Reynolds.  A continuacin se indican algunos mtodos para evitar el embarazo.  No tener relaciones sexuales (abstinencia) es el mtodo ms seguro para el control de la natalidad. Requiere autocontrol. No hay riesgo de contraer enfermedades de transmisin sexual ni el sndrome de inmunodeficiencia adquirida (SIDA).   Abstinencia peridica requiere autocontrol en ciertos perodos del mes.   Mtodo calendario, teniendo en cuenta el momento de sus perodos menstruales todos los meses.   El mtodo de ovulacin es Administrator, Civil Service relaciones sexuales en la poca en la que est ovulando (formando un vulo).   El mtodo simptotrmico es Administrator, Civil Service relaciones sexuales en la poca en la que est ovulando con la utilizacin de un termmetro y los sntomas de la ovulacin.   El mtodo de postovulacin es tener relaciones sexuales despus de la ovulacin.  Estos mtodos no protegen contra las infecciones transmitidas sexualmente ni contra el SIDA.  Las pldoras anticonceptivas contienen estrgenos y Education officer, museum. Estos medicamentos actan impidiendo la ovulacin (la liberacin del huevo del ovario). El mdico prescribir pldoras anticonceptivas, y le har preguntas acerca de los riesgos de tomarlas. Las pldoras anticonceptivas no protegen contra las infecciones transmitidas sexualmente ni contra el SIDA.   La "minipldora" slo contiene progesterona. Deben tomarse todos los [address] del mes y debe prescribirlas el mdico. Estos mtodos no protegen contra las infecciones transmitidas sexualmente ni contra el SIDA.   Los anticonceptivos de Associate Professor, tambin llamados "la pldora del da despus" La pldora puede tomarse inmediatamente despus de Warehouse manager relaciones sexuales o hasta [address] despus, si piensa que su mtodo  anticonceptivo ha [place], o fue forzada a Doctor, hospital. Es ms efectiva si se toma poco tiempo despus. No use los anticonceptivos de emergencia como nico mtodo anticonceptivo. Los anticonceptivos de emergencia estn disponibles sin prescripcin mdica. Consltelo con su farmacutico.   Los condones son una vaina delgada de ltex, material sinttico o piel de cordero que se usan en el pene durante el acto sexual. Cristie Hem un esperimicida incorporado. Los condones de ltex Midwife y las enfermedades de transmisin sexual. Los condones "naturales" o de piel de cordero Development worker, international aid no protegen contra las enfermedades de transmisin sexual ni el sida.   Los condones femeninos son una vaina blanda y que se adaptan suavemente a la vagina antes de las Clinical research associate. Pueden evitar Firefighter y las enfermedades de transmisin sexual, inclusive el sida,   La esponja es una pieza de poliuretano circular suave con espermicida que se inserta en la vagina luego de humedecerla y antes de Management consultant. No requiere prescripcin mdica. Estos mtodos no protegen contra las infecciones transmitidas sexualmente ni contra el SID.   El diafragma es una barrera de ltex redonda y Casimer Bilis que debe ser recomendado por un profesional. Se inserta en la vagina, junto con un gel espermicida. Luego de prepararlo, insrtelo antes de las [employer]. Debe dejar el diafragma colocado en la vagina durante 6 a 8 horas. La eliminacin y reinsercin siempre debe realizarse con un espermicida. Este mtodo no protege contra las infecciones transmitidas sexualmente ni contra el SIDA.   Las inyecciones de progesterona se administran cada 3 meses como mtodo anticonceptivo. Estas inyecciones contienen progesterona sinttica y no contienen estrgenos, Esta hormona impide que los ovarios liberen vulos. Tambin hacen que el moco cervical se espese y modifique el tejido de  recubrimiento interno del  tero. Esto hace ms difcil que los espermatozoides sobrevivan en el tero. No protege contra las infecciones transmitidas sexualmente o el sida.   [place] para el control de la natalidad contiene hormonas similares a las de las pldoras, de modo que la efectividad, los riesgos y los efectos secundarios son los mismos.Deben cambiarse una vez por semana y se utilizan bajo prescripcin mdica. Es menos efectivo en las mujeres con sobrepeso. No protege contra las infecciones transmitidas sexualmente ni el sida.   Anillo vaginal contiene hormonas similares a las que contienen las pldoras anticonceptivas. Se deja colocado durante tres semanas, se lo retira durante una semana y luego se coloca uno nuevo. Trae un timer para colocar en el bolsillo y recordar cundo debe retirarlo y colocarse uno nuevo. Es necesario un examen previo y la prescripcin del mdico, al igual que con las pldoras y Event organiser. No protege contra las infecciones transmitidas sexualmente o el sida.   Las inyecciones de estrgeno ms progesterona se administran cada 28 a 30 das. Pueden aplicarse en el brazo, muslo o nalgas. No protege contra las infecciones transmitidas sexualmente ni contra el SIDA.   Dispositivo intrauterino (DIU): T de cobre o T con progesterona es un dispositivo con forma de T que se coloca en el tero de la mujer durante el perodo menstrual, para Neurosurgeon. La T de cobre dura 10 aos y el dispositivo de progesterona puede durar 5 aos. El DIU de progesterona tambin puede ayudar a Chief Operating Officer los perodos menstruales abundantes. No protege contra las infecciones transmitidas sexualmente ni contra el SIDA. El DIU de T de cobre se puede Chemical engineer como dispositivo de Associate Professor, si se inserta dentro de los [address]  [address] de tener relaciones sexuales sin proteccin.   El capuchn cervical es una barrera de ltex o taza plstica redonda y [place] que cubre el cuello del tero y debe ser colocada por un mdico. No  necesitar Chemical engineer un espermicida para colocarlo o retirarlo cada vez que tiene Clinical research associate. No protege contra las infecciones transmitidas sexualmente ni contra el SIDA.   Los espermicidas son qumicos que matan o bloquean el esperma y no lo dejan ingresar al cuello del tero y al tero. Vienen en forma de cremas, geles, supositorios, espuma o pastillas, y no requieren prescripcin. Se insertan en la vagina con un aplicador antes de Management consultant. Esto debe repetirse cada vez que tiene [employer].   El retiro es un mtodo en el que el hombre retira el pene durante las relaciones sexuales antes de llegar al clmax y depositar el esperma. No protege contra las infecciones transmitidas sexualmente ni contra el SIDA.   La ligadura de trompas en la mujer se realiza sellando quirrgicamente las trompas de [place]op[place] lo que impide que el vulo descienda hacia el tero. No protege contra las infecciones transmitidas sexualmente ni contra el SIDA.   La esterilizacin masculina es cuando al hombre se le atan los conductos quirrgicamente (vasectoma) para que el esperma no ingrese a la vagina durante las Clinical research associate. No protege contra las infecciones transmitidas sexualmente ni contra el SIDA.  Independientemente del mtodo anticonceptivo que usted elija, es importante que utilice alguna forma de proteccin contra las infecciones que se transmiten sexualmente. Document Released: 05/09/2005 Document Revised: 06/11/2010 [facility] Patient Information 2012 [place], [place].Prevencin del asma (Asthma Prevention) El humo del cigarrillo, el polvo domstico, los hongos, el polen, la descamacin de los [place], algunos insectos, la actividad fsica y [place] aire frio pueden  ser disparadores de un episodio de asma. En algunos casos, la causa no se identifica.  Para disminuir la frecuencia de los ataques, siga los siguientes consejos que podr Education officer, environmental en su casa:  Evite el humo del  cigarrillo o el que provenga de otras fuentes. No permita que nadie fume en la casa que habita un enfermo de asma. Si se permite fumar en el interior, deber hacerse en una habitacin en la que se cierre la puerta. Posteriormente, deber abrirse una ventana para ventilar el aire. En lo posible, no utilice un horno a lea, una estufa a querosene o un hogar de lea. Minimice la exposicin a toda fuente de humo, inclusive incienso, velas, fogatas o fuegos artificiales.   Disminuya la exposicin al polvo. Mantenga las ventanas cerradas y [employer] aire acondicionado central durante la temporada de alergia al polen. En lo posible, permanezca dentro de la habitacin con las ventanas cerradas desde las ltimas horas de la maana hasta la tarde. Evite cortar el csped si el polen le causa alergia. Mdese de ropa y tome una ducha despus de estar en el exterior durante esta poca del ao.   Elimine el moho de los baos y zonas hmedas. Hgalo limpiando los pisos con un fungicida o con lavandina diluida. Evite el uso de humidificadores, vaporizadores o refrigerantes hmedos. Pueden diseminar el moho a travs del aire. Arregle las caeras que pierdan agua u otras fuentes de agua que tengan moho alrededor.   Disminuya la exposicin al polvo de la casa. Hgalo dejando los pisos desocupados, aspirndolos con frecuencia y [employer] filtros de las calderas y los acondicionadores de aire con Psychologist, clinical. Evite el uso de elementos de cama de plumas, lana o espuma de goma. Utilice almohadas de Equities trader y cobertores de [employer] colchones. Lave la ropa de cama semanalmente en agua caliente (ms caliente que 130 F).   Trate de pedirle a otra persona que realice el aspirado una o dos veces por semana. Permanezca fuera de las habitaciones mientras son aspiradas y por algn tiempo despus. Si el aspirado lo realiza usted, use una mascarilla para el polvo (consgala en una ferretera), Neomia Dear bolsa para la aspiradora  doble o de [place], o una aspiradora con un filtro HEPA.   Evite los perfumes, el talco, el aerosol para el cabello, las pinturas y otros olores y vapores intensos.   Mantenga a las [address] de [address] caliente (gatos, perros, roedores, Engineer, mining) fuera de su casa, si ellos le provocan el asma. Si no puede mantener a las Engineer, mining, Engineer, water fuera de la habitacin y de otras reas en las que duerme, y Dietitian la puerta cerrada. Quite de su casa las alfombras y los muebles cubiertos con telas. Si eso no es posible, [address] las mascotas lejos de los muebles cubiertos de tela y de las alfombras.   Elimine las cucarachas. Mantenga los alimentos y las bebidas en contenedores cerrados. Nunca deje comida a la vista. Use venenos, tramperas polvos, geles o pastas (por ejemplo cido brico). Si [place] un aerosol para exterminar las cucarachas, permanezca fuera de la habitacin hasta que el olor desaparezca.   Disminuya la humedad del ambiente a menos del 60%. Use un purificador de aire.   Evite los sulfitos en alimentos y bebidas. No beba cerveza ni vino, ni consuma frutas secas, patatas procesadas o langostinos, si estos le producen sntomas de asma.   Evite el aire fro. Cbrase la Clinical cytogeneticist y la boca con una bufanda [employer] fros  o ventosos.   No consuma aspirina. Este es el medicamento que con ms frecuencia causa ataques de asma.   Si la actividad fsica le desencadena un ataque de asma, consulte a su mdico como debe prepararse antes de hacer ejercicio. (Por ejemplo, pregunte si puede usar su inhalador 10 minutos antes de la [place]).   Evite el contacto cercano con personas que estn resfriadas o tienen gripe, ya que los sntomas de asma pueden empeorar si se contagia la infeccin. Lvese las manos cuidadosamente despus de tocar objetos manipulados por otras personas que sufren infecciones respiratorias.   Aplquese la vacuna contra la gripe todos los aos para protegerse contra  el virus de la gripe, lo que con frecuencia hace que el asma empeore durante das o semanas. Tambin aplquese la vacuna contra la neumona una vez cada 5  10 aos.  Comunquese con su mdico si necesita ms informacin acerca de las medidas que puede tomar para prevenir los ataques de asma. Document Released: 05/09/2005 Document Revised: 01/19/2011 [facility] Patient Information 2012 [place], [place].

## 2011-09-12 ENCOUNTER — Other Ambulatory Visit: Payer: Self-pay | Admitting: Family Medicine

## 2011-09-12 DIAGNOSIS — J45909 Unspecified asthma, uncomplicated: Secondary | ICD-10-CM

## 2011-09-12 MED ORDER — MONTELUKAST SODIUM 10 MG PO TABS
10.0000 mg | ORAL_TABLET | Freq: Every day | ORAL | Status: DC
Start: 2011-09-12 — End: 2011-09-15

## 2011-09-12 MED ORDER — FLUTICASONE-SALMETEROL 100-50 MCG/DOSE IN AEPB: 1.0000 | INHALATION_SPRAY | Freq: Two times a day (BID) | RESPIRATORY_TRACT | Status: DC

## 2011-09-12 MED ORDER — ALBUTEROL SULFATE HFA 108 (90 BASE) MCG/ACT IN AERS: 2.0000 | INHALATION_SPRAY | Freq: Four times a day (QID) | RESPIRATORY_TRACT | Status: DC | PRN

## 2011-09-14 ENCOUNTER — Telehealth: Payer: Self-pay | Admitting: *Deleted

## 2011-09-14 DIAGNOSIS — O99519 Diseases of the respiratory system complicating pregnancy, unspecified trimester: Secondary | ICD-10-CM

## 2011-09-14 NOTE — Telephone Encounter (Addendum)
Friend of pt calling because pt does not speak [place]. Pt wants to know what pharmacy her Rx was sent to.  I telephoned the friend this morning and told her that the pharmacy we have listed is [facility], however they do not accept e-prescribed prescriptions.  I will call the pharmacy to be sure they can fill the prescriptions and then call her back.  After speaking with the pharmacist @ [facility], I was told that they do not carry these medications and also they would need the pt to sign up for their MAP (medication assistance program).

## 2011-09-15 MED ORDER — MONTELUKAST SODIUM 10 MG PO TABS: 10.0000 mg | ORAL_TABLET | Freq: Every day | ORAL | Status: DC

## 2011-09-15 MED ORDER — ALBUTEROL SULFATE HFA 108 (90 BASE) MCG/ACT IN AERS: 2.0000 | INHALATION_SPRAY | Freq: Four times a day (QID) | RESPIRATORY_TRACT | Status: DC | PRN

## 2011-09-15 MED ORDER — FLUTICASONE-SALMETEROL 100-50 MCG/DOSE IN AEPB: 1.0000 | INHALATION_SPRAY | Freq: Two times a day (BID) | RESPIRATORY_TRACT | Status: DC

## 2011-09-15 NOTE — Telephone Encounter (Signed)
I returned a call yesterday to the friend of Ms. Marcial-Garcia and left a message that I will need to send the Rx's to [facility] because the [facility] pharmacy does not carry them.  If Cai is interested in applying for the MAP program @ [facility] pharmacy she should contact Peggy @ [phone].

## 2011-09-28 IMAGING — US US OB COMP +14 WK
1 series · 12 of 28 positions shown · non-contrast
Comparison: none

[Series 1: us ob comp +14 wk · 12 of 52 slices shown]
[im 2/52]
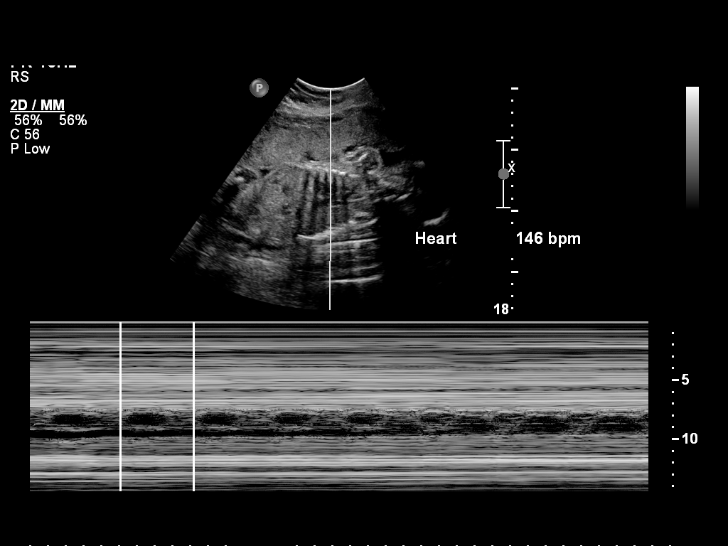
[im 6/52]
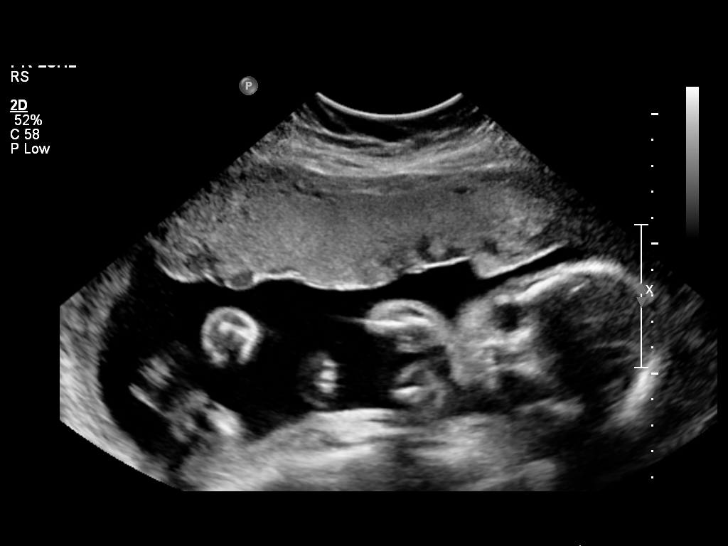
[im 10/52]
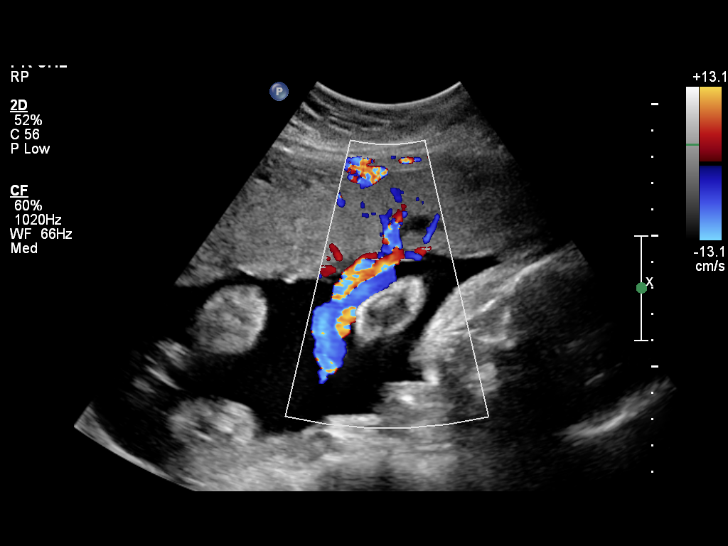
[im 16/52]
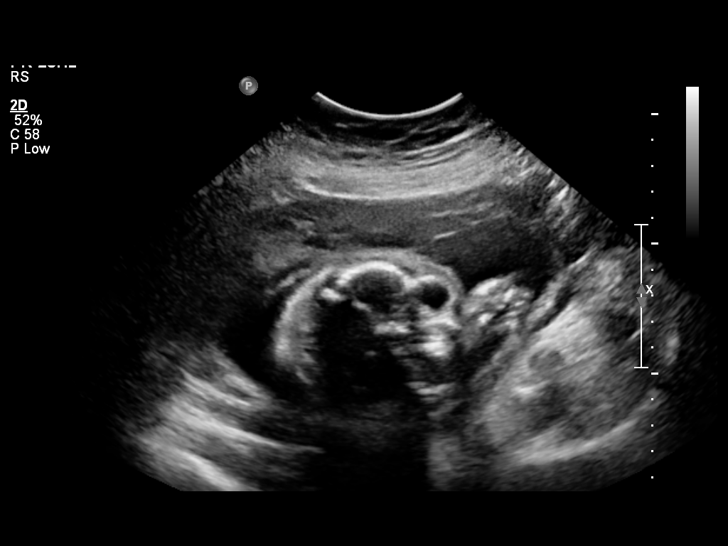
[im 19/52]
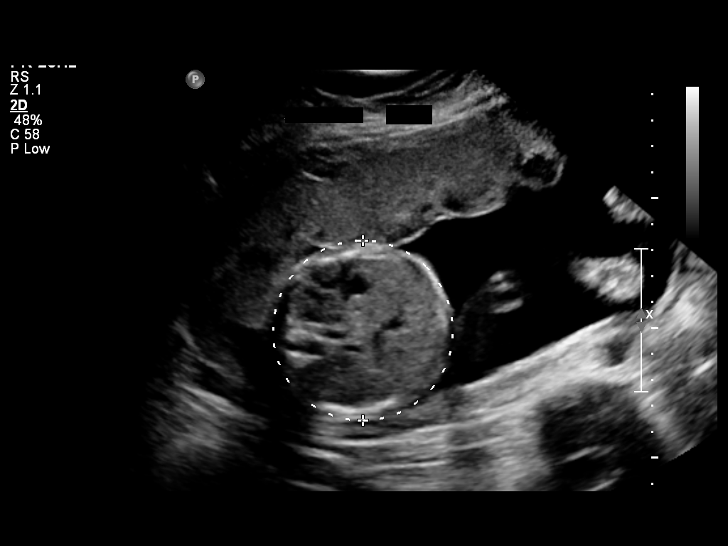
[im 23/52]
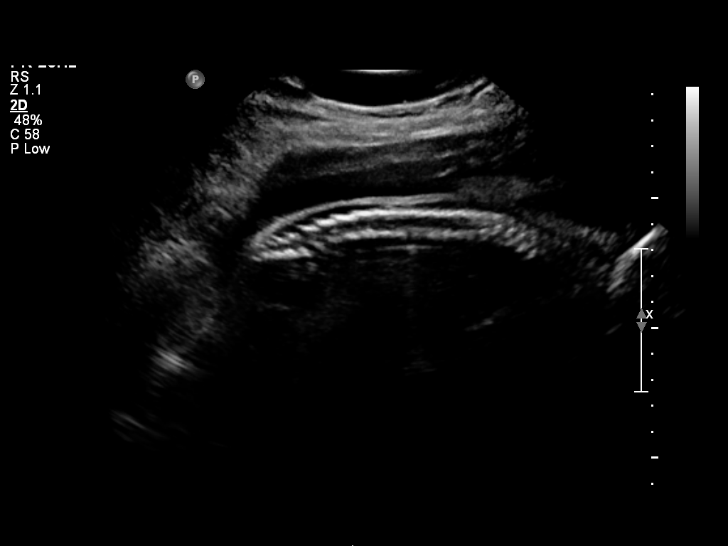
[im 29/52]
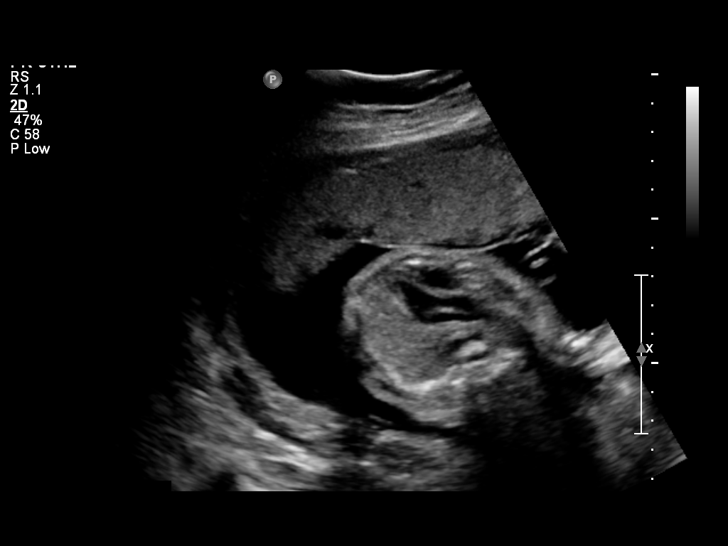
[im 33/52]
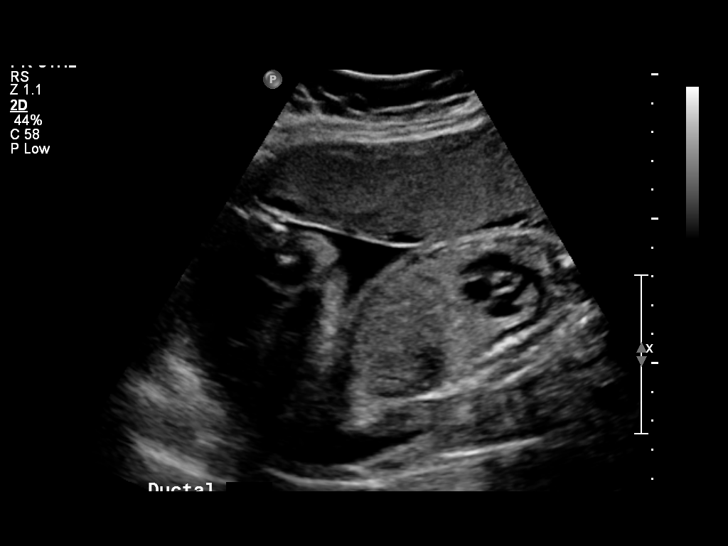
[im 36/52]
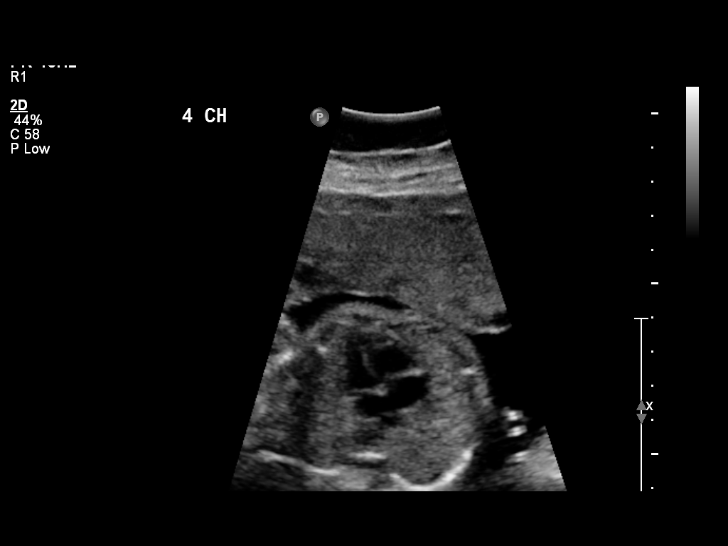
[im 42/52]
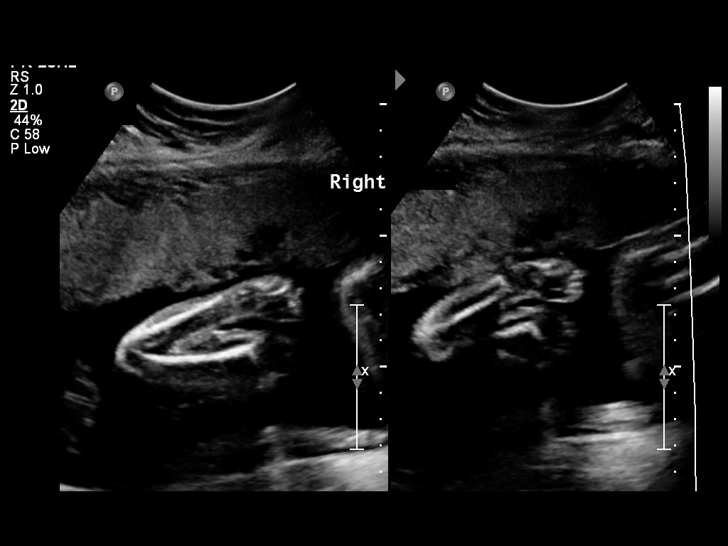
[im 46/52]
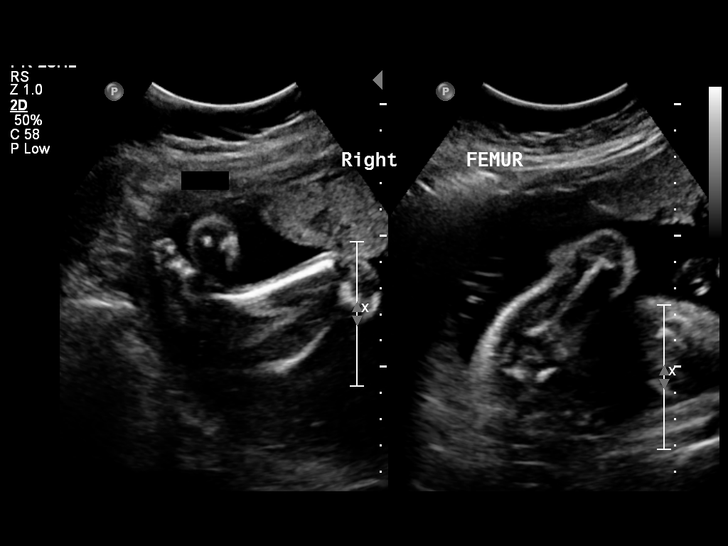
[im 50/52]
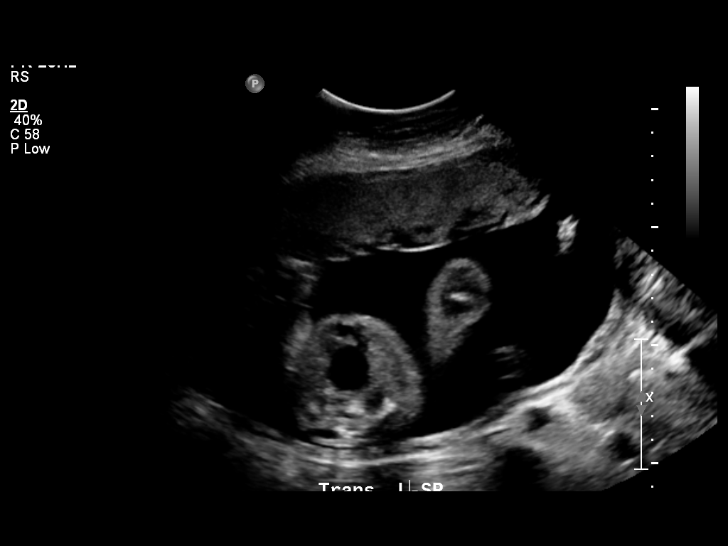

[12 of 28 positions shown; findings below may reference images not displayed]

OBSTETRICS REPORT
                      (Signed Final 02/08/2011 [DATE])

           DAHL

 Order#:         [id number]
Procedures

 US OB Comp + 14 WK                                    76805.1
Indications

 Routine fetal anatomic survey
 No or [facility]
 Confirm dates
Fetal Evaluation

 Fetal Heart Rate:  146                          bpm
 Cardiac Activity:  Observed
 Presentation:      Cephalic
 Placenta:          Anterior, above cervical os
 P. Cord            Visualized
 Insertion:

 Amniotic Fluid
 AFI FV:      Subjectively within normal limits
                                             Larg Pckt:     5.9  cm
Biometry

 BPD:     64.6  mm     G. Age:  26w 1d                CI:        73.13   70 - 86
                                                      FL/HC:      19.5   18.7 -

 HC:     240.1  mm     G. Age:  26w 1d       62  %    HC/AC:      1.10   1.04 -

 AC:     218.1  mm     G. Age:  26w 2d       76  %    FL/BPD:     72.4   71 - 87
 FL:      46.8  mm     G. Age:  25w 4d       51  %    FL/AC:      21.5   20 - 24
 HUM:     44.1  mm     G. Age:  26w 1d       70  %

 Est. FW:     880  gm    1 lb 15 oz      70  %
Gestational Age

 LMP:           25w 1d        Date:  08/16/10                 EDD:   05/23/11
 U/S Today:     26w 0d                                        EDD:   05/17/11
 Best:          25w 1d     Det. By:  LMP  (08/16/10)          EDD:   05/23/11
Anatomy
 Cranium:           Appears normal      Aortic Arch:       Appears normal
 Fetal Cavum:       Appears normal      Ductal Arch:       Appears normal
 Ventricles:        Appears normal      Diaphragm:         Appears normal
 Choroid Plexus:    Appears normal      Stomach:           Appears
                                                           normal, left
                                                           sided
 Cerebellum:        Appears normal      Abdomen:           Appears normal
 Posterior Fossa:   Appears normal      Abdominal Wall:    Appears nml
                                                           (cord insert,
                                                           abd wall)
 Nuchal Fold:       Not applicable      Cord Vessels:      Appears normal
                    (>20 wks GA)                           (3 vessel cord)
 Face:              Lips and orbits     Kidneys:           Appear normal
                    appear normal
 Heart:             Appears normal      Bladder:           Appears normal
                    (4 chamber &
                    axis)
 RVOT:              Appears normal      Spine:             Not well
                                                           visualized
 LVOT:              Appears normal      Limbs:             Appears normal
                                                           (hands, ankles,
                                                           feet)

 Other:     Male gender.
Cervix Uterus Adnexa

 Cervical Length:    3.6      cm

 Cervix:       Closed.

 Adnexa:     No abnormality visualized.
Impression

 Single live IUP in cephalic presentation.  Concordant
 measurements/assigned GA by LMP.
 Spine not well visualized due to fetal position. Anatomic
 survey otherwise normal.

 ZAITSU with us.  Please do not hesitate to contact

## 2014-02-24 LAB — OB RESULTS CONSOLE GC/CHLAMYDIA
CHLAMYDIA, DNA PROBE: NEGATIVE
GC PROBE AMP, GENITAL: NEGATIVE

## 2014-02-24 LAB — OB RESULTS CONSOLE HGB/HCT, BLOOD
HCT: 38 %
Hemoglobin: 12.6 g/dL

## 2014-02-24 LAB — CYTOLOGY - PAP: Pap: NEGATIVE

## 2014-02-24 LAB — OB RESULTS CONSOLE HIV ANTIBODY (ROUTINE TESTING): HIV: NONREACTIVE

## 2014-02-24 LAB — OB RESULTS CONSOLE RPR: RPR: NONREACTIVE

## 2014-02-24 LAB — OB RESULTS CONSOLE ABO/RH: RH Type: POSITIVE

## 2014-02-24 LAB — OB RESULTS CONSOLE ANTIBODY SCREEN: Antibody Screen: NEGATIVE

## 2014-02-24 LAB — OB RESULTS CONSOLE VARICELLA ZOSTER ANTIBODY, IGG: Varicella: IMMUNE

## 2014-02-24 LAB — OB RESULTS CONSOLE HEPATITIS B SURFACE ANTIGEN: Hepatitis B Surface Ag: NEGATIVE

## 2014-02-24 LAB — GLUCOSE TOLERANCE, 1 HOUR: Glucose, 1 Hour GTT: 73

## 2014-02-24 LAB — OB RESULTS CONSOLE RUBELLA ANTIBODY, IGM: RUBELLA: IMMUNE

## 2014-02-24 LAB — SICKLE CELL SCREEN: Sickle Cell Screen: NORMAL

## 2014-03-10 ENCOUNTER — Inpatient Hospital Stay (HOSPITAL_COMMUNITY)
Admission: AD | Admit: 2014-03-10 | Discharge: 2014-03-10 | Disposition: A | Discharge status: Home / Self Care | Payer: Medicaid Other | Source: Ambulatory Visit | Attending: Family Medicine | Admitting: Family Medicine

## 2014-03-10 ENCOUNTER — Inpatient Hospital Stay (HOSPITAL_COMMUNITY): Payer: Medicaid Other

## 2014-03-10 ENCOUNTER — Encounter (HOSPITAL_COMMUNITY): Payer: Self-pay | Admitting: *Deleted

## 2014-03-10 DIAGNOSIS — O4401 Placenta previa specified as without hemorrhage, first trimester: Secondary | ICD-10-CM

## 2014-03-10 DIAGNOSIS — R109 Unspecified abdominal pain: Secondary | ICD-10-CM | POA: Diagnosis present

## 2014-03-10 DIAGNOSIS — Z3A16 16 weeks gestation of pregnancy: Secondary | ICD-10-CM | POA: Diagnosis not present

## 2014-03-10 DIAGNOSIS — O4412 Placenta previa with hemorrhage, second trimester: Secondary | ICD-10-CM | POA: Insufficient documentation

## 2014-03-10 DIAGNOSIS — O209 Hemorrhage in early pregnancy, unspecified: Secondary | ICD-10-CM

## 2014-03-10 DIAGNOSIS — O4691 Antepartum hemorrhage, unspecified, first trimester: Secondary | ICD-10-CM

## 2014-03-10 HISTORY — DX: Chronic sinusitis, unspecified: J32.9

## 2014-03-10 LAB — COMPREHENSIVE METABOLIC PANEL
ALT: 14 U/L (ref 0–35)
AST: 15 U/L (ref 0–37)
Albumin: 3.4 g/dL — ABNORMAL LOW (ref 3.5–5.2)
Alkaline Phosphatase: 84 U/L (ref 39–117)
Anion gap: 11 (ref 5–15)
BILIRUBIN TOTAL: 0.3 mg/dL (ref 0.3–1.2)
BUN: 4 mg/dL — ABNORMAL LOW (ref 6–23)
CALCIUM: 8.8 mg/dL (ref 8.4–10.5)
CHLORIDE: 102 meq/L (ref 96–112)
CO2: 23 meq/L (ref 19–32)
CREATININE: 0.54 mg/dL (ref 0.50–1.10)
GLUCOSE: 107 mg/dL — AB (ref 70–99)
Potassium: 3.5 mEq/L — ABNORMAL LOW (ref 3.7–5.3)
SODIUM: 136 meq/L — AB (ref 137–147)
Total Protein: 7.2 g/dL (ref 6.0–8.3)

## 2014-03-10 LAB — CBC
HCT: 36.2 % (ref 36.0–46.0)
HEMOGLOBIN: 12.7 g/dL (ref 12.0–15.0)
MCH: 32.2 pg (ref 26.0–34.0)
MCHC: 35.1 g/dL (ref 30.0–36.0)
MCV: 91.9 fL (ref 78.0–100.0)
PLATELETS: 186 10*3/uL (ref 150–400)
RBC: 3.94 MIL/uL (ref 3.87–5.11)
RDW: 13.4 % (ref 11.5–15.5)
WBC: 9.7 10*3/uL (ref 4.0–10.5)

## 2014-03-10 LAB — URINE MICROSCOPIC-ADD ON

## 2014-03-10 LAB — WET PREP, GENITAL
Clue Cells Wet Prep HPF POC: NONE SEEN
Trich, Wet Prep: NONE SEEN
WBC, Wet Prep HPF POC: NONE SEEN
YEAST WET PREP: NONE SEEN

## 2014-03-10 LAB — URINALYSIS, ROUTINE W REFLEX MICROSCOPIC
Bilirubin Urine: NEGATIVE
GLUCOSE, UA: NEGATIVE mg/dL
Ketones, ur: NEGATIVE mg/dL
LEUKOCYTES UA: NEGATIVE
Nitrite: NEGATIVE
PH: 6.5 (ref 5.0–8.0)
Protein, ur: NEGATIVE mg/dL
Urobilinogen, UA: 0.2 mg/dL (ref 0.0–1.0)

## 2014-03-10 NOTE — MAU Provider Note (Signed)
Attestation of Attending Supervision of Advanced Practitioner (PA/CNM/NP): Evaluation and management procedures were performed by the Advanced Practitioner under my supervision and collaboration.  I have reviewed the Advanced Practitioner's note and chart, and I agree with the management and plan.  Elverna Caffee, DO Attending Physician Faculty Practice, [facility]  

## 2014-03-10 NOTE — Discharge Instructions (Signed)
Reposo plvico  (Pelvic Rest) El reposo plvico se recomienda a las mujeres cuando:   La placenta cubre parcial o completamente la abertura del cuello del tero (placenta previa).  Hay sangrado entre la pared del tero y el saco amnitico en el primer trimestre (hemorragia subcorinica).  El cuello uterino comienza a abrirse sin iniciarse el trabajo de parto (cuello uterino incompetente, insuficiencia cervical).  El Oatmantrabajo de parto se inicia muy pronto (parto prematuro). INSTRUCCIONES PARA EL CUIDADO EN EL HOGAR   No tenga relaciones sexuales, estimulacin, ni orgasmos.  No use tampones, no se haga duchas vaginales ni coloque ningn objeto en la vagina.  No levante objetos que pesen ms de 10 libras (4,5 kg).  Evite las actividades extenuantes o tensionar los msculos de la pelvis. SOLICITE ATENCIN MDICA SI:  1. Tiene un sangrado vaginal Academic librariandurante el embarazo. Considrelo como una posible emergencia. 2. Siente clicos en la zona baja del estmago (ms fuertes que los clicos menstruales). 3. Nota flujo vaginal (acuoso, con moco o [place]re). 4. Siente un dolor en la espalda leve y sordo. 5. Tiene contracciones regulares o endurecimiento del tero. SOLICITE ATENCIN MDICA DE INMEDIATO SI:  Observa sangrado vaginal y tiene placenta previa.  Document Released: 02/01/2012 [facility] Patient Information 2015 [place], [place]. This information is not intended to replace advice given to you by your health care provider. Make sure you discuss any questions you have with your health care provider.  Placenta previa  (Placenta Previa) La placenta previa es un trastorno que padece una mujer embarazada cuando la placenta se implanta en la parte inferior del tero. La placenta cubre de manera parcial o total la abertura del cuello del tero. Es un problema, ya que el beb debe pasar a travs del cuello del tero durante el [place] Valleyparto. Hay tres tipos de placenta previa. Ellos son:  6. Placenta previa  marginal. La placenta est cerca del cuello uterino pero no cubre la abertura. 7. Placenta previa parcial. La placenta cubre una parte de la abertura del cuello del tero. 8. Placenta previa completa. La placenta cubre toda la abertura del cuello del tero.  Segn el tipo de placenta previa,existe la probabilidad de que la placenta pueda ubicarse en una posicin normal y que deje de cubrir el cuello del tero, a medida que el embarazo avanza. Es importante que cumpla con todos los controles prenatales.  FACTORES DE RIESGO  Es ms probable que sufra placenta previa si:   Est embarazada de ms de un beb (embarazo mltiple).   La forma de su tero no es normal.   Tiene cicatrices en el revestimiento interno del tero.   Tuvo cirugas anteriores en el tero, como una cesrea.   Ya ha tenido un beb.   Tiene una historia de placenta previa.   Ha fumado o ingerido cocana Academic librariandurante el embarazo.   Est embarazada y tiene 35 aos o ms.  SNTOMAS  El sntoma principal de la placenta previa es un sangrado vaginal indoloro, sbito, durante la segunda mitad del [place]embarazo. La cantidad del sangrado puede ser desde ligeramente leve a abundante. El sangrado puede detenerse por s mismo, pero siempre vuelve a ocurrir. Tambin puede haber clicos, contracciones regulares, dolor abdominal y Radiographer, therapeuticdolor en la cintura.  DIAGNSTICO  La placenta previa puede diagnosticarse con una ecografa, encontrando la ubicacin de la placenta. La ecografa puede encontrar la placenta previa ya sea durante una visita prenatal de rutina o luego de que se advierte un sangrado vaginal. Si le diagnostican placenta previa, el  mdico evitar los exmenes vaginales para reducir el riesgo de un sangrado abundante. Es posible que la placenta previa no se diagnostique hasta que se produzca el sangrado durante el Centennialtrabajo de [place]parto.  TRATAMIENTO  El tratamiento especfico depende de:   La cantidad de sangrado o si ste se ha  detenido.  En qu etapa se encuentra del embarazo.   El Rattanestado del beb.   La ubicacin del beb y de la placenta.   El tipo de placenta previa.  Segn esos factores, el mdico podr indicar:   Que disminuya la actividad fsica   Hacer reposo en cama, en su casa o en el hospital.  Reposo plvico Esto significa que no tenga relaciones sexuales, no use tampones, no se haga exmenes plvicos ni introduzca nada dentro de la vagina.  Una transfusin sangunea para reponer la prdida de [address] materna.  Una cesrea si el sangrado es abundante y no puede ser controlado o si la placenta cubre completamente el cuello.  Medicamentos para TEFL teacherdetener un trabajo de parto prematuro o para que [place] Tool Worksmaduren los pulmones del feto si es necesario iniciar el parto antes de que el embarazo llegue a trmino.  EN QU MOMENTO DEBE BUSCAR ASISTENCIA MDICA DE INMEDIATO SI LA ENVAN A SU CASA CON EL DIAGNSTICO DE PLACENTA PREVIA?  Solicite atencin mdica de inmediato si tiene sntomas de placenta previa. Debe concurrir al hospital para ser controlada inmediatamente. Nuevamente, esos sntomas son:   Sangrado vaginal repentino e indoloro, an una pequea cantidad.  Clicos o contracciones regulares.  Dolor en la cintura o en el abdomen. Document Released: 02/16/2005 Document Revised: 01/09/2013 [facility] Patient Information 2015 [place]Exit[place], [place]. This information is not intended to replace advice given to you by your health care provider. Make sure you discuss any questions you have with your health care provider.

## 2014-03-10 NOTE — MAU Provider Note (Signed)
History     CSN: [id number]  Arrival date and time: 03/10/14 5621   First Provider Initiated Contact with Patient 03/10/14 2055      Chief Complaint  Patient presents with  . Abdominal Pain  . Vaginal Bleeding   Abdominal Pain  Vaginal Bleeding Associated symptoms include abdominal pain.   Meghan Mclean is a 38 y.o. [id number] at Unknown who presents today with vaginal bleeding. She states that she has had off and on abdominal pain for about a month, but it has gotten worse over the past 2 days. She has also started having spotting last week. She saw someone in [place] who told her that she needed an [place], but she did not have the money for one so she never had it done.   Past Medical History  Diagnosis Date  . Asthma 2007  . Sinusitis     History reviewed. No pertinent past surgical history.  History reviewed. No pertinent family history.  History  Substance Use Topics  . Smoking status: Never Smoker   . Smokeless tobacco: Never Used  . Alcohol Use: No    Allergies:  Allergies  Allergen Reactions  . Aspirin Other (See Comments)    "Athsma attack"  . Ibuprofen     REACTION: asthma trigger    Prescriptions prior to admission  Medication Sig Dispense Refill  . acetaminophen (TYLENOL) 325 MG tablet Take 325 mg by mouth every 6 (six) hours as needed.      Marland Kitchen albuterol (PROVENTIL HFA;VENTOLIN HFA) 108 (90 BASE) MCG/ACT inhaler Inhale 2 puffs into the lungs every 6 (six) hours as needed for wheezing or shortness of breath.  1 Inhaler  3  . Fluticasone-Salmeterol (ADVAIR) 100-50 MCG/DOSE AEPB Inhale 1 puff into the lungs 2 (two) times daily.  60 each  3  . Loratadine (CLARITIN) 10 MG CAPS Take 1 capsule (10 mg total) by mouth daily.  30 each  3  . montelukast (SINGULAIR) 10 MG tablet Take 1 tablet (10 mg total) by mouth at bedtime.  30 tablet  3    Review of Systems  Gastrointestinal: Positive for abdominal pain.  Genitourinary: Positive for vaginal bleeding.    Physical Exam   Blood pressure 110/72, pulse 92, temperature 99.4 F (37.4 C), temperature source Oral, resp. rate 18, height 5' (1.524 m), weight 73.936 kg (163 lb), last menstrual period 12/15/2013, SpO2 100.00%, currently breastfeeding.  Physical Exam  Nursing note and vitals reviewed. Constitutional: She is oriented to person, place, and time. She appears well-developed and well-nourished. No distress.  Cardiovascular: Normal rate.   Respiratory: Effort normal.  GI: Soft. There is no tenderness. There is no rebound.  Genitourinary:   External: no lesion Vagina: scant amount of pink blood seen Cervix: pink, smooth Uterus: About 18-20 week size    Neurological: She is alert and oriented to person, place, and time.  Skin: Skin is warm and dry.  Psychiatric: She has a normal mood and affect.    MAU Course  Procedures  Results for orders placed during the hospital encounter of 03/10/14 (from the past 24 hour(s))  URINALYSIS, ROUTINE W REFLEX MICROSCOPIC     Status: Abnormal   Collection Time    03/10/14  7:40 PM      Result Value Ref Range   Color, Urine YELLOW  YELLOW   APPearance CLEAR  CLEAR   Specific Gravity, Urine <1.005 (*) 1.005 - 1.030   pH 6.5  5.0 - 8.0   Glucose, UA NEGATIVE  NEGATIVE mg/dL   Hgb urine dipstick SMALL (*) NEGATIVE   Bilirubin Urine NEGATIVE  NEGATIVE   Ketones, ur NEGATIVE  NEGATIVE mg/dL   Protein, ur NEGATIVE  NEGATIVE mg/dL   Urobilinogen, UA 0.2  0.0 - 1.0 mg/dL   Nitrite NEGATIVE  NEGATIVE   Leukocytes, UA NEGATIVE  NEGATIVE  URINE MICROSCOPIC-ADD ON     Status: None   Collection Time    03/10/14  7:40 PM      Result Value Ref Range   Squamous Epithelial / LPF RARE  RARE   WBC, UA 0-2  <3 WBC/hpf   RBC / HPF 0-2  <3 RBC/hpf   Bacteria, UA RARE  RARE  WET PREP, GENITAL     Status: None   Collection Time    03/10/14  9:00 PM      Result Value Ref Range   Yeast Wet Prep HPF POC NONE SEEN  NONE SEEN   Trich, Wet Prep NONE SEEN   NONE SEEN   Clue Cells Wet Prep HPF POC NONE SEEN  NONE SEEN   WBC, Wet Prep HPF POC NONE SEEN  NONE SEEN  CBC     Status: None   Collection Time    03/10/14  9:08 PM      Result Value Ref Range   WBC 9.7  4.0 - 10.5 K/uL   RBC 3.94  3.87 - 5.11 MIL/uL   Hemoglobin 12.7  12.0 - 15.0 g/dL   HCT 16.136.2  09.636.0 - 04.546.0 %   MCV 91.9  78.0 - 100.0 fL   MCH 32.2  26.0 - 34.0 pg   MCHC 35.1  30.0 - 36.0 g/dL   RDW 40.913.4  81.111.5 - 91.415.5 %   Platelets 186  150 - 400 K/uL  COMPREHENSIVE METABOLIC PANEL     Status: Abnormal   Collection Time    03/10/14  9:08 PM      Result Value Ref Range   Sodium 136 (*) 137 - 147 mEq/L   Potassium 3.5 (*) 3.7 - 5.3 mEq/L   Chloride 102  96 - 112 mEq/L   CO2 23  19 - 32 mEq/L   Glucose, Bld 107 (*) 70 - 99 mg/dL   BUN 4 (*) 6 - 23 mg/dL   Creatinine, Ser 7.820.54  0.50 - 1.10 mg/dL   Calcium 8.8  8.4 - 95.610.5 mg/dL   Total Protein 7.2  6.0 - 8.3 g/dL   Albumin 3.4 (*) 3.5 - 5.2 g/dL   AST 15  0 - 37 U/L   ALT 14  0 - 35 U/L   Alkaline Phosphatase 84  39 - 117 U/L   Total Bilirubin 0.3  0.3 - 1.2 mg/dL   GFR calc non Af Amer >90  >90 mL/min   GFR calc Af Amer >90  >90 mL/min   Anion gap 11  5 - 15   [place] Ob Comp Less 14 Wks  03/10/2014   CLINICAL DATA:  Pregnant patient with vaginal bleeding and low back pain. Estimated gestational age based on the last menstrual period is 12 weeks and 1 day.  EXAM: OBSTETRIC <14 WK ULTRASOUND  TECHNIQUE: Transabdominal ultrasound was performed for evaluation of the gestation as well as the maternal uterus and adnexal regions.  COMPARISON:  None.  FINDINGS: Intrauterine gestational sac: Visualized/normal in shape.  Yolk sac:  Not visualized  Embryo:  Well-formed embryo in a cephalic position.  Cardiac Activity: Yes  Heart Rate: 140 bpm  Biparietal  diameter: 23.2  mm   14 w   0  d  CRL:   80.3  mm   14 w 1 d                  US EDC: 09/08/14  Maternal uterus/adnexae: No uterine masses. Placenta is anteriorly wrapping around the left  margin of the uterus to the posterior aspect. There also extends to the internal cervical os. No evidence of a retroplacental/subchronic hemorrhage. Cervix is closed measuring 4.2 cm in length.  Neither ovary was identified.  No adnexal masses.  No free fluid.  IMPRESSION: 1. Single live intrauterine pregnancy with a measured gestational age of [redacted] weeks based on a biparietal diameter of 23.2 mm. This is concordant with the crown-rump length. This is a second trimester pregnancy. 2. No emergent fetal or maternal complication. 3. The placenta does appear to extend over the internal cervical os.   Electronically Signed   By: Amie Portlandavid  Ormond M.D.   On: 03/10/2014 22:02   Assessment and Plan   1. Vaginal bleeding in pregnancy, first trimester   2. Placenta previa antepartum, first trimester    Pelvic rest Bleeding precautions  [facility]  Tawnya CrookHogan, Heather Donovan 03/10/2014, 9:02 PM

## 2014-03-10 NOTE — MAU Note (Signed)
Pt reports she went to the doctor today, difficult to find the heartbeat so they told her she would need an ultrasound so the pt went home to get the money to have the ultrasound and while she was at home she started having vaginal bleeding. States it is on the tissue when she wipes. Lower back pain that feels like contractions.

## 2014-03-11 LAB — HIV ANTIBODY (ROUTINE TESTING W REFLEX): HIV 1&2 Ab, 4th Generation: NONREACTIVE

## 2014-03-11 LAB — GC/CHLAMYDIA PROBE AMP
CT Probe RNA: NEGATIVE
GC Probe RNA: NEGATIVE

## 2014-03-24 ENCOUNTER — Encounter (HOSPITAL_COMMUNITY): Payer: Self-pay | Admitting: *Deleted

## 2014-03-25 ENCOUNTER — Other Ambulatory Visit (HOSPITAL_COMMUNITY): Payer: Self-pay | Admitting: Nurse Practitioner

## 2014-03-25 DIAGNOSIS — O4402 Placenta previa specified as without hemorrhage, second trimester: Secondary | ICD-10-CM

## 2014-04-14 ENCOUNTER — Encounter: Payer: Self-pay | Admitting: Internal Medicine

## 2014-04-14 ENCOUNTER — Ambulatory Visit (INDEPENDENT_AMBULATORY_CARE_PROVIDER_SITE_OTHER): Payer: Self-pay | Admitting: Internal Medicine

## 2014-04-14 VITALS — BP 110/60 | HR 84 | Ht 60.25 in | Wt 170.8 lb

## 2014-04-14 DIAGNOSIS — J45909 Unspecified asthma, uncomplicated: Secondary | ICD-10-CM

## 2014-04-14 DIAGNOSIS — O9989 Other specified diseases and conditions complicating pregnancy, childbirth and the puerperium: Secondary | ICD-10-CM

## 2014-04-14 DIAGNOSIS — O99519 Diseases of the respiratory system complicating pregnancy, unspecified trimester: Principal | ICD-10-CM

## 2014-04-14 MED ORDER — BUDESONIDE-FORMOTEROL FUMARATE 80-4.5 MCG/ACT IN AERO: INHALATION_SPRAY | RESPIRATORY_TRACT | Status: DC

## 2014-04-14 MED ORDER — BUDESONIDE-FORMOTEROL FUMARATE 80-4.5 MCG/ACT IN AERO: 2.0000 | INHALATION_SPRAY | Freq: Two times a day (BID) | RESPIRATORY_TRACT | Status: DC

## 2014-04-14 MED ORDER — PREDNISONE 10 MG PO TABS: ORAL_TABLET | ORAL | Status: DC

## 2014-04-14 NOTE — Patient Instructions (Addendum)
Prednisone 10 mg take  4 each am x 2 days,   2 each am x 2 days,  1 each am x 2 days and stop    nasal steroids like fluticasone  have no immediate benefit in terms of improving symptoms.  To help them reached the target tissue, the patient should use Afrin two puffs every 12 hours applied one min before using the nasal steroids.  Afrin should be stopped after no more than 5 days.  If the symptoms worsen, Afrin can be restarted after 5 days off of therapy to prevent rebound congestion from overuse of Afrin.  I also emphasized that in no way are nasal steroids a concern in terms of "addiction".   Stop advair and start symbicort 80 Take 2 puffs first thing in am and then another 2 puffs about 12 hours later and out through your nose  If not better, I strongly recommend you see Bardelas - call me and I can arrange an appointment - if doing better then see me in 6 weeks     La prednisona 10 mg toman cada 4 am x 2 das, 2 x 2 am cada da, cada 1 am x 2 das y TEFL teacherdetener  esteroides nasales como fluticasona tienen ningn beneficio inmediato en trminos de mejora de los sntomas. Para ayudarles a llegar al tejido Yemendiana, el paciente debe Tenet Healthcareutilizar Afrin dos inhalaciones cada 12 horas aplicado un minuto antes de usar los esteroides nasales. Afrin debe interrumpirse despus de no ms de 5 das. Si los sntomas empeoran, Afrin se puede reiniciar despus de 5 das de descanso de la terapia para evitar la congestin de rebote por el uso excesivo de Afrin. Tambin hice hincapi en que de ninguna manera son los esteroides nasales una preocupacin en trminos de "adiccin".  Deje de Advair y empezar SYMBICORT 80 Take 2 inhalaciones a primera hora de la maana y Express Scriptsluego otros 2 inhalaciones cerca de 12 horas ms tarde y por la nariz  Si no mejor, os recomiendo que vea Geographical information systems officerBardelas - me llaman y puedo concertar una cita - si hacerlo mejor que verme en 6 semanas

## 2014-04-14 NOTE — Assessment & Plan Note (Addendum)
DDX of  difficult airways management all start with A and  include Adherence, Ace Inhibitors, Acid Reflux, Active Sinus Disease, Alpha 1 Antitripsin deficiency, Anxiety masquerading as Airways dz,  ABPA,  allergy(esp in young), Aspiration (esp in elderly), Adverse effects of DPI,  Active smokers, plus two Bs  = Bronchiectasis and Beta blocker use..and one C= CHF  Adherence is always the initial "prime suspect" and is a multilayered concern that requires a "trust but verify" approach in every patient - starting with knowing how to use medications, especially inhalers, correctly, keeping up with refills and understanding the fundamental difference between maintenance and prns vs those medications only taken for a very short course and then stopped and not refilled.  - The proper method of use, as well as anticipated side effects, of a metered-dose inhaler are discussed and demonstrated to the patient. Improved effectiveness after extensive coaching during this visit to a level of approximately  75% so start symbicort 80 2bid and stop advair  ? Active sinus dz > she clearly has poorly controlled rhinitis but no evidence of sinusitis at present so hold abx  ? Allergy > not clear who she saw previously but based on my very difficult interview today even thru her interpreter separating out upper from lower airway symptoms I would strongly suggest she see Dr Beaulah DinningBardelas if my recs don't work: continue singulair, blow symbicort out thru nose, continue flonase but add afrin x 2 m before each flonase x 2 days  ? Adverse effects of advair > try off   ? chf / no edema/ no hbp / strongly doubt clinically   See instructions for specific recommendations which were reviewed directly with the patient in english with translator and then copy given to her in spanish    with highlighter outlining the key components.

## 2014-04-14 NOTE — Progress Notes (Signed)
   Subjective:    Patient ID: Meghan Mclean, female    DOB: July 14, 1975  MRN: [id number]  HPI  38 yo latino female never smoke never problem with respiratory problems until 3 m p spont ab 2007  bad asthma 2009 some better p rx with advair then after 1996, 98, 2001, 2002 all ok with nl term pregancies  referred to pulmonary clinic by Public health dept during IUP mainly complaining of nasal congestion with   04/14/2014 1st Meghan Mclean Pulmonary office visit/ Meghan Mclean   Chief Complaint  Patient presents with  . Pulmonary Consult    Self referral. Pt c/o nasal congestion and sinus pressure for the past several months. She c/o occ cough with white to yellow sputum.    says breathing better on advair 100 one bid despite IUP 19 weeks and last saba 2 d prior to OV   Main concern is nasal congestion much worse at [facility] assoc with hacking cough/ subjective wheeze / sneeze Prednisone has helped in past. Allergy w//u in [place] neg (does not remember the allergist speaking spanish, though.  .No obvious other patterns in day to day or daytime variabilty or assoc  cp or chest tightness, subjective wheeze overt hb symptoms. No unusual exp hx or h/o childhood pna/ asthma or knowledge of premature birth.   Also denies any obvious fluctuation of symptoms with weather or environmental changes or other aggravating or alleviating factors except as outlined above   Current Medications, Allergies, Complete Past Medical History, Past Surgical History, Family History, and Social History were [facility].           Review of Systems  Constitutional: Negative for fever, chills and unexpected weight change.  HENT: Positive for congestion, sinus pressure and sneezing. Negative for dental problem, ear pain, nosebleeds, postnasal drip, rhinorrhea, sore throat, trouble swallowing and voice change.   Eyes: Negative for visual disturbance.  Respiratory: Negative for cough, choking  and shortness of breath.   Cardiovascular: Negative for chest pain and leg swelling.  Gastrointestinal: Negative for vomiting, abdominal pain and diarrhea.  Genitourinary: Negative for difficulty urinating.  Musculoskeletal: Negative for arthralgias.  Skin: Negative for rash.  Neurological: Negative for tremors, syncope and headaches.  Hematological: Does not bruise/bleed easily.       Objective:   Physical Exam  amb pleasant latino female nad  Wt Readings from Last 3 Encounters:  04/14/14 170 lb 12.8 oz (77.474 kg)  03/10/14 163 lb (73.936 kg)  06/17/11 148 lb 4.8 oz (67.268 kg)    Vital signs reviewed   HEENT: nl dentition,  and orophanx. Nl external ear canals without cough reflex Severe non specific turbinated edema bilaterally    NECK :  without JVD/Nodes/TM/ nl carotid upstrokes bilaterally   LUNGS: no acc muscle use, insp and exp rhonchi bilaterally    CV:  RRR  no s3 or murmur or increase in P2, no edema   ABD:  soft and nontender with nl excursion in the supine position. No bruits or organomegaly, bowel sounds nl  MS:  warm without deformities, calf tenderness, cyanosis or clubbing  SKIN: warm and dry without lesions    NEURO:  alert, approp, no deficits   No recent cxr's on file         Assessment & Plan:

## 2014-04-15 ENCOUNTER — Other Ambulatory Visit (HOSPITAL_COMMUNITY): Payer: Self-pay | Admitting: Nurse Practitioner

## 2014-04-15 ENCOUNTER — Ambulatory Visit (HOSPITAL_COMMUNITY)
Admission: RE | Admit: 2014-04-15 | Discharge: 2014-04-15 | Disposition: A | Discharge status: Home / Self Care | Payer: Medicaid Other | Source: Ambulatory Visit | Attending: Nurse Practitioner | Admitting: Nurse Practitioner

## 2014-04-15 DIAGNOSIS — Z3A19 19 weeks gestation of pregnancy: Secondary | ICD-10-CM | POA: Insufficient documentation

## 2014-04-15 DIAGNOSIS — O4690 Antepartum hemorrhage, unspecified, unspecified trimester: Secondary | ICD-10-CM | POA: Insufficient documentation

## 2014-04-15 DIAGNOSIS — O4402 Placenta previa specified as without hemorrhage, second trimester: Secondary | ICD-10-CM

## 2014-04-15 DIAGNOSIS — Z3689 Encounter for other specified antenatal screening: Secondary | ICD-10-CM | POA: Insufficient documentation

## 2014-04-15 DIAGNOSIS — O09529 Supervision of elderly multigravida, unspecified trimester: Secondary | ICD-10-CM | POA: Insufficient documentation

## 2014-04-15 DIAGNOSIS — O208 Other hemorrhage in early pregnancy: Secondary | ICD-10-CM | POA: Insufficient documentation

## 2014-04-15 DIAGNOSIS — O09522 Supervision of elderly multigravida, second trimester: Secondary | ICD-10-CM | POA: Insufficient documentation

## 2014-04-22 ENCOUNTER — Telehealth: Payer: Self-pay | Admitting: Internal Medicine

## 2014-04-22 NOTE — Telephone Encounter (Signed)
[facility] Elnita MaxwellMary S. States that pt was rxd Symbicort 04/14/14 by MW and she tried taking x 3 days. Pt contacted their office c/o chest pain, throat swelling and difficulty sleeping. Feels d/t the Symbicort. Fredrik RiggerJohn Triplett, PA wants to know if pt can patient resume taking Advair? Can patient also continue with Flonase? Please advise Dr Sherene SiresWert. Thanks.

## 2014-04-22 NOTE — Telephone Encounter (Signed)
Called [facility] and spoke with Elnita MaxwellMary S-- advised of below per MW.  Nothing further needed.

## 2014-04-22 NOTE — Telephone Encounter (Signed)
Fine with me

## 2014-04-23 ENCOUNTER — Other Ambulatory Visit (HOSPITAL_COMMUNITY): Payer: Self-pay | Admitting: Physician Assistant

## 2014-04-23 DIAGNOSIS — Z3689 Encounter for other specified antenatal screening: Secondary | ICD-10-CM

## 2014-04-23 DIAGNOSIS — Z0374 Encounter for suspected problem with fetal growth ruled out: Secondary | ICD-10-CM

## 2014-05-14 ENCOUNTER — Ambulatory Visit (HOSPITAL_COMMUNITY)
Admission: RE | Admit: 2014-05-14 | Discharge: 2014-05-14 | Disposition: A | Discharge status: Home / Self Care | Payer: Self-pay | Source: Ambulatory Visit | Attending: Physician Assistant | Admitting: Physician Assistant

## 2014-05-14 DIAGNOSIS — Z3689 Encounter for other specified antenatal screening: Secondary | ICD-10-CM

## 2014-05-14 DIAGNOSIS — O43199 Other malformation of placenta, unspecified trimester: Secondary | ICD-10-CM | POA: Insufficient documentation

## 2014-05-14 DIAGNOSIS — Z0374 Encounter for suspected problem with fetal growth ruled out: Secondary | ICD-10-CM

## 2014-05-14 DIAGNOSIS — Z3A23 23 weeks gestation of pregnancy: Secondary | ICD-10-CM | POA: Insufficient documentation

## 2014-05-14 DIAGNOSIS — Z36 Encounter for antenatal screening of mother: Secondary | ICD-10-CM | POA: Insufficient documentation

## 2014-05-21 ENCOUNTER — Other Ambulatory Visit (HOSPITAL_COMMUNITY): Payer: Self-pay | Admitting: Physician Assistant

## 2014-05-21 DIAGNOSIS — O43193 Other malformation of placenta, third trimester: Secondary | ICD-10-CM

## 2014-05-21 DIAGNOSIS — Z3A29 29 weeks gestation of pregnancy: Secondary | ICD-10-CM

## 2014-05-21 DIAGNOSIS — O09523 Supervision of elderly multigravida, third trimester: Secondary | ICD-10-CM

## 2014-05-23 NOTE — L&D Delivery Note (Signed)
Patient is 39 y.o. [id number] 460w1d admitted for induction of labor for cholestasis   Delivery Note At 12:34 AM a healthy female was delivered via Vaginal, Spontaneous Delivery (Presentation: ; Occiput Anterior). Nuchal cord noted. APGAR: pending. weight pending .   Placenta status: Intact, Spontaneous.  Cord: 3 vessels with the following complications: None.    Anesthesia: Epidural  Episiotomy: None Lacerations: None Suture Repair: N/A Est. Blood Loss (mL):  450. Cytotec 1000mg  rectal. Will continue to monitor.  Mom to postpartum.  Baby to Couplet care / Skin to Skin.  [name], [place] 08/19/2014, 12:58 AM  OB Fellow Attestation:  Patient is a 3360w1d at 3660w1d who was admitted for induction of labor secondary to cholestatsis. She progressed with augmentation via pitocin with early AROM.   I was gloved and present for delivery in its entirety.  Second stage of labor progressed, baby delivered after 3 contractions.  No decels during second stage noted.  Complications: none  Lacerations: none  EBL: 450, cytotec placed  William DaltonMcEachern, Montford Barg, MD 1:02 AM

## 2014-06-23 LAB — OB RESULTS CONSOLE RPR: RPR: NONREACTIVE

## 2014-06-23 LAB — GLUCOSE TOLERANCE, 1 HOUR: Glucose, 1 Hour GTT: 177

## 2014-06-23 LAB — OB RESULTS CONSOLE HGB/HCT, BLOOD
HCT: 36 %
HEMOGLOBIN: 11.7 g/dL

## 2014-06-25 ENCOUNTER — Ambulatory Visit (HOSPITAL_COMMUNITY): Payer: Self-pay

## 2014-06-27 LAB — GLUCOSE TOLERANCE, 3 HOURS
GLUCOSE 2 HOUR GTT: 137 mg/dL (ref ?–140)
Glucose, GTT - 1 Hour: 153 mg/dL (ref ?–200)
Glucose, GTT - 3 Hour: 116 mg/dL (ref ?–140)
Glucose, GTT - Fasting: 89 mg/dL (ref 80–110)

## 2014-07-24 ENCOUNTER — Ambulatory Visit (HOSPITAL_COMMUNITY)
Admission: RE | Admit: 2014-07-24 | Discharge: 2014-07-24 | Disposition: A | Discharge status: Home / Self Care | Payer: Self-pay | Source: Ambulatory Visit | Attending: Physician Assistant | Admitting: Physician Assistant

## 2014-07-24 ENCOUNTER — Encounter (HOSPITAL_COMMUNITY): Payer: Self-pay

## 2014-07-24 DIAGNOSIS — O43893 Other placental disorders, third trimester: Secondary | ICD-10-CM | POA: Insufficient documentation

## 2014-07-24 DIAGNOSIS — O43193 Other malformation of placenta, third trimester: Secondary | ICD-10-CM

## 2014-07-24 DIAGNOSIS — O43199 Other malformation of placenta, unspecified trimester: Secondary | ICD-10-CM | POA: Insufficient documentation

## 2014-07-24 DIAGNOSIS — Z3A33 33 weeks gestation of pregnancy: Secondary | ICD-10-CM | POA: Insufficient documentation

## 2014-07-24 DIAGNOSIS — O09523 Supervision of elderly multigravida, third trimester: Secondary | ICD-10-CM | POA: Insufficient documentation

## 2014-07-24 DIAGNOSIS — Z3A29 29 weeks gestation of pregnancy: Secondary | ICD-10-CM

## 2014-07-25 ENCOUNTER — Other Ambulatory Visit (HOSPITAL_COMMUNITY): Payer: Self-pay | Admitting: Physician Assistant

## 2014-07-25 DIAGNOSIS — K831 Obstruction of bile duct: Secondary | ICD-10-CM

## 2014-07-25 DIAGNOSIS — O26643 Intrahepatic cholestasis of pregnancy, third trimester: Secondary | ICD-10-CM

## 2014-07-25 DIAGNOSIS — O26613 Liver and biliary tract disorders in pregnancy, third trimester: Principal | ICD-10-CM

## 2014-07-28 ENCOUNTER — Ambulatory Visit (HOSPITAL_COMMUNITY)
Admission: RE | Admit: 2014-07-28 | Discharge: 2014-07-28 | Disposition: A | Discharge status: Home / Self Care | Payer: Self-pay | Source: Ambulatory Visit | Attending: Physician Assistant | Admitting: Physician Assistant

## 2014-07-28 ENCOUNTER — Encounter: Payer: Self-pay | Admitting: General Practice

## 2014-07-28 DIAGNOSIS — K831 Obstruction of bile duct: Secondary | ICD-10-CM | POA: Insufficient documentation

## 2014-07-28 DIAGNOSIS — Z3A34 34 weeks gestation of pregnancy: Secondary | ICD-10-CM | POA: Insufficient documentation

## 2014-07-28 DIAGNOSIS — O09523 Supervision of elderly multigravida, third trimester: Secondary | ICD-10-CM | POA: Insufficient documentation

## 2014-07-28 DIAGNOSIS — O43893 Other placental disorders, third trimester: Secondary | ICD-10-CM | POA: Insufficient documentation

## 2014-07-28 DIAGNOSIS — O26613 Liver and biliary tract disorders in pregnancy, third trimester: Secondary | ICD-10-CM | POA: Insufficient documentation

## 2014-07-31 ENCOUNTER — Encounter: Payer: Self-pay | Admitting: Obstetrics & Gynecology

## 2014-07-31 ENCOUNTER — Ambulatory Visit (INDEPENDENT_AMBULATORY_CARE_PROVIDER_SITE_OTHER): Payer: Self-pay | Admitting: Obstetrics & Gynecology

## 2014-07-31 VITALS — BP 99/64 | HR 106 | Temp 98.3°F | Wt 181.7 lb

## 2014-07-31 DIAGNOSIS — J4530 Mild persistent asthma, uncomplicated: Secondary | ICD-10-CM

## 2014-07-31 DIAGNOSIS — O09523 Supervision of elderly multigravida, third trimester: Secondary | ICD-10-CM

## 2014-07-31 DIAGNOSIS — O99613 Diseases of the digestive system complicating pregnancy, third trimester: Secondary | ICD-10-CM

## 2014-07-31 DIAGNOSIS — O0933 Supervision of pregnancy with insufficient antenatal care, third trimester: Secondary | ICD-10-CM

## 2014-07-31 DIAGNOSIS — K8020 Calculus of gallbladder without cholecystitis without obstruction: Secondary | ICD-10-CM

## 2014-07-31 DIAGNOSIS — Z3493 Encounter for supervision of normal pregnancy, unspecified, third trimester: Secondary | ICD-10-CM

## 2014-07-31 DIAGNOSIS — O9989 Other specified diseases and conditions complicating pregnancy, childbirth and the puerperium: Secondary | ICD-10-CM

## 2014-07-31 LAB — POCT URINALYSIS DIP (DEVICE)
Bilirubin Urine: NEGATIVE
Glucose, UA: NEGATIVE mg/dL
Ketones, ur: NEGATIVE mg/dL
Leukocytes, UA: NEGATIVE
Nitrite: NEGATIVE
PH: 7 (ref 5.0–8.0)
PROTEIN: NEGATIVE mg/dL
Specific Gravity, Urine: 1.02 (ref 1.005–1.030)
Urobilinogen, UA: 0.2 mg/dL (ref 0.0–1.0)

## 2014-07-31 MED ORDER — DIPHENHYDRAMINE HCL 50 MG PO TABS: 50.0000 mg | ORAL_TABLET | Freq: Every evening | ORAL | Status: DC | PRN

## 2014-07-31 MED ORDER — FLUTICASONE-SALMETEROL 100-50 MCG/DOSE IN AEPB: 1.0000 | INHALATION_SPRAY | Freq: Two times a day (BID) | RESPIRATORY_TRACT | Status: DC

## 2014-07-31 MED ORDER — PANTOPRAZOLE SODIUM 40 MG PO TBEC: 40.0000 mg | DELAYED_RELEASE_TABLET | Freq: Every day | ORAL | Status: DC

## 2014-07-31 NOTE — Progress Notes (Signed)
Transfer from HD cholestasis of preg, bile acids 17, had this last pregnancy. US 3/7 no previa, cephalic. Delivery 37 weeks  Subjective:cholestasis of pregnancy    Meghan Mclean is a [id number] 4957w3d being seen today for her first obstetrical visit.  Her obstetrical history is significant for cholestasis. Patient does intend to breast feed. Pregnancy history fully reviewed.  Patient reports itch and heartburn.  Filed Vitals:   07/31/14 0847 07/31/14 0850  BP: 97/81 99/64  Pulse: 106   Temp: 98.3 F (36.8 C)   Weight: 181 lb 11.2 oz (82.419 kg)     HISTORY: OB History  Gravida Para Term Preterm AB SAB TAB Ectopic Multiple Living  9 7 6 1 1  0 0 1 0 7    # Outcome Date GA Lbr Len/2nd Weight Sex Delivery Anes PTL Lv  9 Current           8 Term 05/12/11 8838w0d 17:52 / 01:39 8 lb 2.7 oz (3.705 kg) M Vag-Spont EPI    7 Term 11/06/07 3449w0d  8 lb (3.629 kg) F Vag-Spont EPI  Y     [facility]  6 Ectopic 2007 6874w0d         5 Term 06/22/03 1549w0d  8 lb (3.629 kg) F Vag-Spont None  Y     [facility]  4 Term 01/19/00 3749w0d  7 lb (3.175 kg) M Vag-Spont None  Y     [facility]  3 Term 05/24/98 8049w0d  8 lb (3.629 kg) M Vag-Spont None  Y     [place]  2 Term 12/26/96 5849w0d   F Vag-Spont None  Y     Comments: [place]  1 Preterm 08/21/94 8955w0d   F Vag-Spont None Y Y     Comments: home delivery [place]     Past Medical History  Diagnosis Date  . Asthma 2007  . Sinusitis    Past Surgical History  Procedure Laterality Date  . No past surgeries     History reviewed. No pertinent family history.   Exam    Uterus:  Fundal Height: 36 cm  Pelvic Exam:                                    Skin: normal coloration and turgor, no rashes    Neurologic: oriented, normal mood   Extremities: normal strength, tone, and muscle mass   HEENT neck supple with midline trachea   Mouth/Teeth dental hygiene good   Neck supple   Cardiovascular: regular rate and rhythm   Respiratory:  appears well, vitals normal, no respiratory distress, acyanotic, normal RR   Abdomen: soft, non-tender; bowel sounds normal; no masses,  no organomegaly   Urinary: urethral meatus normal      Assessment:    Pregnancy: [id number] Patient Active Problem List   Diagnosis Date Noted  . [redacted] weeks gestation of pregnancy   . Immature abnormal placenta   . [redacted] weeks gestation of pregnancy   . Encounter for fetal anatomic survey   . AMA (advanced maternal age) multigravida 35+   . Vaginal bleeding in pregnancy   . Intrahepatic cholestasis of pregnancy 05/12/2011  . Itching 05/09/2011  . Pregnancy, supervision, high-risk 04/07/2011  . Itching 03/10/2011  . Grand multipara 02/03/2011  . Carpal tunnel syndrome 02/03/2011  . Maternal asthma complicating pregnancy 02/03/2011  .  Asthma 01/27/2011  . Headache 07/20/2009  . ANEMIA 03/18/2009  . PICA 12/15/2008  . ALLERGIC RHINITIS 10/22/2008  . BOILS, RECURRENT 10/22/2008        Plan:     Initial labs drawn. Prenatal vitamins. Problem list reviewed and updated. Genetic Screening discussed Quad Screen: results reviewed.  Ultrasound discussed; fetal survey: results reviewed.  Follow up in 1 weeks. 50% of 30 min visit spent on counseling and coordination of care.  NST 2/week   ARNOLD,JAMES 07/31/2014

## 2014-07-31 NOTE — Progress Notes (Signed)
Reports contractions Tuesday night every 15 minutes that were painful; also reports pain/pressure in pelvis Alis used for interpreter

## 2014-07-31 NOTE — Patient Instructions (Signed)
Colestasis del embarazo (Cholestasis of Pregnancy) El trmino colestasis hace referencia a cualquier afeccin que provoca el enlentecimiento o la interrupcin del flujo del lquido de la digestin (bilis) que el hgado fabrica. La colestasis del embarazo es ms frecuente hacia el final del embarazo (tercertrimestre), pero puede presentarse en cualquier momento durante la gestacin. Con frecuencia, la afeccin desaparece tan pronto como nace el beb.  La colestasis puede causar molestias, pero a menudo no tiene efectos nocivos para usted; sin embargo, puede ser daina para el beb. La colestasis puede aumentar el riesgo de que el beb nazca demasiado pronto (parto prematuro).  CAUSAS  La causa de la colestasis del embarazo no se conoce. Las hormonas del embarazo pueden afectar el funcionamiento de la vescula biliar. Normalmente, la vescula biliar contiene la bilis que el hgado fabrica hasta que se la necesita para ayudar a digerir las grasas de la dieta. Las hormonas del embarazo pueden enlentecer el flujo de la bilis y hacer que retroceda al hgado. Luego, la bilis ingresa al torrente sanguneo y provoca sntomas de colestasis. FACTORES DE RIESGO Puede correr ms riesgo si:  Tuvo colestasis durante un embarazo anterior.  Tiene antecedentes familiares de colestasis.  Tiene problemas de hgado.  Espera gemelos. SIGNOS Y SNTOMAS  El sntoma ms frecuente de la colestasis del embarazo es la picazn intensa, especialmente de las palmas de las manos y las plantas de los pies. La picazn puede extenderse al resto del cuerpo y suele ser peor por la noche. Generalmente, no tendr una erupcin cutnea. Otros sntomas son:   Cansancio.  Coloracin amarillenta de la piel y la parte blanca de los ojos (ictericia).  Orina de color oscuro.  Heces de color claro.  Prdida del apetito. DIAGNSTICO  El mdico revisar sus antecedentes mdicos y le har un examen fsico. Tal vez le hagan anlisis de  sangre para controlar la funcin heptica, el nivel de bilis y de bilirrubina.  TRATAMIENTO  El objetivo del tratamiento es hacer que se sienta ms cmoda y proteger al beb. El mdico puede recetar medicamentos para calmar la picazn. El medicamento que se usa tambin puede mejorar los resultados de los anlisis de sangre y ayudar a proteger al beb. Adems, el mdico puede darle vitaminaK antes del parto para evitar el sangrado excesivo.  Es posible que el mdico quiera controlar frecuentemente al beb (monitoreo fetal), por ejemplo, cada 2semanas. Una vez que los pulmones del beb estn suficientemente desarrollados, el mdico puede recomendar el inicio (induccin) del trabajo de parto y el parto en la semana37de embarazo. INSTRUCCIONES PARA EL CUIDADO EN EL HOGAR   Use las cremas para aliviar la picazn y tome los medicamentos solamente como se lo haya indicado el mdico.  Tome baos de inmersin en agua fra para aliviar la picazn.  Mantenga las uas cortas para no irritar la piel al rascarse.  Concurra a todas las visitas de monitoreo fetal. SOLICITE ATENCIN MDICA SI:  Los sntomas empeoran a pesar del tratamiento. SOLICITE ATENCIN MDICA DE INMEDIATO SI: Comienza el trabajo de parto prematuro en su casa. ASEGRESE DE QUE:  Comprende estas instrucciones.  Controlar su afeccin.  Recibir ayuda de inmediato si no mejora o si empeora. Document Released: 02/16/2005 Document Revised: 09/23/2013 ExitCare Patient Information 2015 [employer]. This information is not intended to replace advice given to you by your health care provider. Make sure you discuss any questions you have with your health care provider.  

## 2014-08-05 ENCOUNTER — Ambulatory Visit (INDEPENDENT_AMBULATORY_CARE_PROVIDER_SITE_OTHER): Payer: Self-pay | Admitting: *Deleted

## 2014-08-05 VITALS — BP 108/68 | HR 99

## 2014-08-05 DIAGNOSIS — K831 Obstruction of bile duct: Secondary | ICD-10-CM

## 2014-08-05 DIAGNOSIS — O26613 Liver and biliary tract disorders in pregnancy, third trimester: Secondary | ICD-10-CM

## 2014-08-05 LAB — US OB FOLLOW UP

## 2014-08-05 MED ORDER — URSODIOL 300 MG PO CAPS: 300.0000 mg | ORAL_CAPSULE | Freq: Three times a day (TID) | ORAL | Status: DC

## 2014-08-05 NOTE — Progress Notes (Addendum)
Interpreter - Pearletha AlfredMaria Elena Jimenez present for encounter.  Pt asked about placental location - states she has placenta previa. Per US reports on 3/3 and 3/9, placenta is above the os. Dr. Debroah LoopArnold notified that pt has not been prescribed Actigall. Rx sent to pharmacy. IOL scheduled 3/28 @ 0730.

## 2014-08-07 ENCOUNTER — Ambulatory Visit (INDEPENDENT_AMBULATORY_CARE_PROVIDER_SITE_OTHER): Payer: Self-pay | Admitting: Obstetrics & Gynecology

## 2014-08-07 ENCOUNTER — Telehealth: Payer: Self-pay | Admitting: General Practice

## 2014-08-07 VITALS — BP 106/66 | HR 91 | Wt 182.9 lb

## 2014-08-07 DIAGNOSIS — O0933 Supervision of pregnancy with insufficient antenatal care, third trimester: Secondary | ICD-10-CM

## 2014-08-07 DIAGNOSIS — O26613 Liver and biliary tract disorders in pregnancy, third trimester: Secondary | ICD-10-CM

## 2014-08-07 DIAGNOSIS — J4530 Mild persistent asthma, uncomplicated: Secondary | ICD-10-CM

## 2014-08-07 DIAGNOSIS — K8020 Calculus of gallbladder without cholecystitis without obstruction: Secondary | ICD-10-CM

## 2014-08-07 DIAGNOSIS — O99613 Diseases of the digestive system complicating pregnancy, third trimester: Secondary | ICD-10-CM

## 2014-08-07 DIAGNOSIS — O9989 Other specified diseases and conditions complicating pregnancy, childbirth and the puerperium: Secondary | ICD-10-CM

## 2014-08-07 DIAGNOSIS — K831 Obstruction of bile duct: Secondary | ICD-10-CM

## 2014-08-07 DIAGNOSIS — O09523 Supervision of elderly multigravida, third trimester: Secondary | ICD-10-CM

## 2014-08-07 LAB — POCT URINALYSIS DIP (DEVICE)
Bilirubin Urine: NEGATIVE
Glucose, UA: NEGATIVE mg/dL
Hgb urine dipstick: NEGATIVE
KETONES UR: NEGATIVE mg/dL
Leukocytes, UA: NEGATIVE
NITRITE: NEGATIVE
PH: 5.5 (ref 5.0–8.0)
PROTEIN: NEGATIVE mg/dL
UROBILINOGEN UA: 0.2 mg/dL (ref 0.0–1.0)

## 2014-08-07 NOTE — Progress Notes (Signed)
Used Interpreter Viviana SimplerAlis Herrera.  States not taking inhalers because too expensive, only using albuterol as needed. States notices when takes claritin baby sleepy- not moving much for hours after taking. States for example took claritin at 3pm yesterday , then after about an hour felt like  baby moved much less and then during night when would wake did not feel baby move until got to clinic this am. C/o feeling some contractions and increased pressure.

## 2014-08-07 NOTE — Telephone Encounter (Addendum)
Patient called and left message stating she picked up the medication she needed for the itching but she is unsure how many pills she is supposed to take. Called patient back with pacific interpreter [phone], no answer- left message stating we are trying to return your phone call, please call us back at the clinics  [phone]  [facility]Called pt w/[employer] Interpreter # [phone] Lawanna Kobus(Angel). Pt cannot read English and therefore does not know how she should take the Ursodiol medication. She has taken 1 capsule today @ 3pm. I advised pt to take another capsule today @ bedtime. Beginning tomorrow, she should take 1 capsule three times daily until the medication is finished or until she is admitted to hospital - whichever comes first.  Pt voiced understanding.   Diane Day RNC

## 2014-08-07 NOTE — Progress Notes (Signed)
NST reactive, needs to pick up medicine. Irregular UC, precautions given

## 2014-08-07 NOTE — Patient Instructions (Signed)
Colestasis del embarazo (Cholestasis of Pregnancy) El trmino colestasis hace referencia a cualquier afeccin que provoca el enlentecimiento o la interrupcin del flujo del lquido de la digestin (bilis) que el hgado fabrica. La colestasis del embarazo es ms frecuente hacia el final del embarazo (tercertrimestre), pero puede presentarse en cualquier momento durante la gestacin. Con frecuencia, la afeccin desaparece tan pronto como nace el beb.  La colestasis puede causar molestias, pero a menudo no tiene efectos nocivos para usted; sin embargo, puede ser daina para el beb. La colestasis puede aumentar el riesgo de que el beb nazca demasiado pronto (parto prematuro).  CAUSAS  La causa de la colestasis del embarazo no se conoce. Las hormonas del embarazo pueden afectar el funcionamiento de la vescula biliar. Normalmente, la vescula biliar contiene la bilis que el hgado fabrica hasta que se la necesita para ayudar a digerir las grasas de la dieta. Las hormonas del embarazo pueden enlentecer el flujo de la bilis y hacer que retroceda al hgado. Luego, la bilis ingresa al torrente sanguneo y provoca sntomas de colestasis. FACTORES DE RIESGO Puede correr ms riesgo si:  Tuvo colestasis durante un embarazo anterior.  Tiene antecedentes familiares de colestasis.  Tiene problemas de hgado.  Espera gemelos. SIGNOS Y SNTOMAS  El sntoma ms frecuente de la colestasis del embarazo es la picazn intensa, especialmente de las palmas de las manos y las plantas de los pies. La picazn puede extenderse al resto del cuerpo y suele ser peor por la noche. Generalmente, no tendr una erupcin cutnea. Otros sntomas son:   Cansancio.  Coloracin amarillenta de la piel y la parte blanca de los ojos (ictericia).  Orina de color oscuro.  Heces de color claro.  Prdida del apetito. DIAGNSTICO  El mdico revisar sus antecedentes mdicos y le har un examen fsico. Tal vez le hagan anlisis de  sangre para controlar la funcin heptica, el nivel de bilis y de bilirrubina.  TRATAMIENTO  El objetivo del tratamiento es hacer que se sienta ms cmoda y proteger al beb. El mdico puede recetar medicamentos para calmar la picazn. El medicamento que se usa tambin puede mejorar los resultados de los anlisis de sangre y ayudar a proteger al beb. Adems, el mdico puede darle vitaminaK antes del parto para evitar el sangrado excesivo.  Es posible que el mdico quiera controlar frecuentemente al beb (monitoreo fetal), por ejemplo, cada 2semanas. Una vez que los pulmones del beb estn suficientemente desarrollados, el mdico puede recomendar el inicio (induccin) del trabajo de parto y el parto en la semana37de embarazo. INSTRUCCIONES PARA EL CUIDADO EN EL HOGAR   Use las cremas para aliviar la picazn y tome los medicamentos solamente como se lo haya indicado el mdico.  Tome baos de inmersin en agua fra para aliviar la picazn.  Mantenga las uas cortas para no irritar la piel al rascarse.  Concurra a todas las visitas de monitoreo fetal. SOLICITE ATENCIN MDICA SI:  Los sntomas empeoran a pesar del tratamiento. SOLICITE ATENCIN MDICA DE INMEDIATO SI: Comienza el trabajo de parto prematuro en su casa. ASEGRESE DE QUE:  Comprende estas instrucciones.  Controlar su afeccin.  Recibir ayuda de inmediato si no mejora o si empeora. Document Released: 02/16/2005 Document Revised: 09/23/2013 ExitCare Patient Information 2015 [employer]. This information is not intended to replace advice given to you by your health care provider. Make sure you discuss any questions you have with your health care provider.  

## 2014-08-11 ENCOUNTER — Ambulatory Visit (INDEPENDENT_AMBULATORY_CARE_PROVIDER_SITE_OTHER): Payer: Self-pay | Admitting: *Deleted

## 2014-08-11 VITALS — BP 115/70 | HR 88 | Wt 184.9 lb

## 2014-08-11 DIAGNOSIS — K831 Obstruction of bile duct: Secondary | ICD-10-CM

## 2014-08-11 DIAGNOSIS — O26613 Liver and biliary tract disorders in pregnancy, third trimester: Secondary | ICD-10-CM

## 2014-08-11 LAB — US OB FOLLOW UP

## 2014-08-11 MED ORDER — HYDROXYZINE HCL 25 MG PO TABS: 25.0000 mg | ORAL_TABLET | Freq: Four times a day (QID) | ORAL | Status: DC | PRN

## 2014-08-11 NOTE — Progress Notes (Signed)
Called by Sedalia Mutaiane Day, RN to talk to patient about medication issues.  Patient is Spanish-speaking only, Spanish interpreter present for this encounter. I talked to patient about importance of continuing Actigall; and to take it with Atarax to help with the side effects and also help with the itching from the cholestasis.  Atarax prescribed, will monitor effect.  NST performed today was reviewed and was found to be reactive.  Continue recommended antenatal testing and prenatal care.

## 2014-08-11 NOTE — Progress Notes (Signed)
Used interpreter Meghan Mclean. States having that she is having a lot of itching and her medicine is not helping. States she is not sleeping well because of the itching. States she thinks the actigall is causing her lips to swell, and face to itch . States only took it 2 days , stopped Saturday 08/09/14. States lips did not look swollen, she just felt they were.

## 2014-08-12 ENCOUNTER — Telehealth: Payer: Self-pay | Admitting: General Practice

## 2014-08-12 NOTE — Telephone Encounter (Signed)
Patient called back to front office stating she had only called us yesterday because she received a missed call from us. Per chart review, cannot see where patient was called. Told patient I do not know who called her or what in regards to but we have no information to pass on at this time. Patient verbalized understanding and had no questions

## 2014-08-12 NOTE — Telephone Encounter (Signed)
Patient called and left message in Spanish for us to call her back. Called patient with [name] for interpreter- no answer. Left message stating we are trying to return your phone call, please call back at the clinics and leave us a message of how we can assist you.

## 2014-08-13 ENCOUNTER — Telehealth (HOSPITAL_COMMUNITY): Payer: Self-pay | Admitting: *Deleted

## 2014-08-13 ENCOUNTER — Encounter (HOSPITAL_COMMUNITY): Payer: Self-pay | Admitting: *Deleted

## 2014-08-13 NOTE — Telephone Encounter (Signed)
[phone] interpreter number

## 2014-08-13 NOTE — Telephone Encounter (Signed)
Preadmission screen  

## 2014-08-14 ENCOUNTER — Encounter: Payer: Self-pay | Admitting: Obstetrics and Gynecology

## 2014-08-14 ENCOUNTER — Other Ambulatory Visit: Payer: Self-pay | Admitting: Obstetrics and Gynecology

## 2014-08-14 ENCOUNTER — Ambulatory Visit (INDEPENDENT_AMBULATORY_CARE_PROVIDER_SITE_OTHER): Payer: Self-pay | Admitting: Obstetrics and Gynecology

## 2014-08-14 VITALS — BP 109/67 | HR 89 | Wt 181.7 lb

## 2014-08-14 DIAGNOSIS — K8020 Calculus of gallbladder without cholecystitis without obstruction: Secondary | ICD-10-CM

## 2014-08-14 DIAGNOSIS — G5600 Carpal tunnel syndrome, unspecified upper limb: Secondary | ICD-10-CM

## 2014-08-14 DIAGNOSIS — Z641 Problems related to multiparity: Secondary | ICD-10-CM

## 2014-08-14 DIAGNOSIS — J45909 Unspecified asthma, uncomplicated: Secondary | ICD-10-CM

## 2014-08-14 DIAGNOSIS — O9989 Other specified diseases and conditions complicating pregnancy, childbirth and the puerperium: Secondary | ICD-10-CM

## 2014-08-14 DIAGNOSIS — O26613 Liver and biliary tract disorders in pregnancy, third trimester: Principal | ICD-10-CM

## 2014-08-14 DIAGNOSIS — O99613 Diseases of the digestive system complicating pregnancy, third trimester: Secondary | ICD-10-CM

## 2014-08-14 DIAGNOSIS — J4530 Mild persistent asthma, uncomplicated: Secondary | ICD-10-CM

## 2014-08-14 DIAGNOSIS — K831 Obstruction of bile duct: Secondary | ICD-10-CM

## 2014-08-14 DIAGNOSIS — O09523 Supervision of elderly multigravida, third trimester: Secondary | ICD-10-CM

## 2014-08-14 DIAGNOSIS — O99519 Diseases of the respiratory system complicating pregnancy, unspecified trimester: Secondary | ICD-10-CM

## 2014-08-14 DIAGNOSIS — O0993 Supervision of high risk pregnancy, unspecified, third trimester: Secondary | ICD-10-CM

## 2014-08-14 DIAGNOSIS — Z113 Encounter for screening for infections with a predominantly sexual mode of transmission: Secondary | ICD-10-CM

## 2014-08-14 DIAGNOSIS — O0933 Supervision of pregnancy with insufficient antenatal care, third trimester: Secondary | ICD-10-CM

## 2014-08-14 DIAGNOSIS — Z118 Encounter for screening for other infectious and parasitic diseases: Secondary | ICD-10-CM

## 2014-08-14 LAB — POCT URINALYSIS DIP (DEVICE)
Bilirubin Urine: NEGATIVE
Glucose, UA: NEGATIVE mg/dL
Hgb urine dipstick: NEGATIVE
KETONES UR: NEGATIVE mg/dL
Nitrite: NEGATIVE
PH: 7 (ref 5.0–8.0)
PROTEIN: NEGATIVE mg/dL
Specific Gravity, Urine: 1.015 (ref 1.005–1.030)
Urobilinogen, UA: 0.2 mg/dL (ref 0.0–1.0)

## 2014-08-14 LAB — OB RESULTS CONSOLE GBS: GBS: POSITIVE

## 2014-08-14 MED ORDER — FLUTICASONE-SALMETEROL 100-50 MCG/DOSE IN AEPB: 1.0000 | INHALATION_SPRAY | Freq: Two times a day (BID) | RESPIRATORY_TRACT | Status: DC

## 2014-08-14 MED ORDER — PANTOPRAZOLE SODIUM 40 MG PO TBEC: 40.0000 mg | DELAYED_RELEASE_TABLET | Freq: Every day | ORAL | Status: DC

## 2014-08-14 NOTE — Progress Notes (Signed)
Patient is doing well with continued pruritis. Continue Actigall. Cultures collected today. Patient scheduled for IOL at 37 weeks  FM/Labor precautions reviewed NST reviewed and reactive

## 2014-08-14 NOTE — Progress Notes (Signed)
Patient states needs refill on advair inhaler and protonix; patient reports no movement since yesterday afternoon; states every time she takes the medication for itching (actigall) baby stops moving

## 2014-08-15 LAB — CULTURE, BETA STREP (GROUP B ONLY)

## 2014-08-15 LAB — GC/CHLAMYDIA PROBE AMP
CT Probe RNA: NEGATIVE
GC Probe RNA: NEGATIVE

## 2014-08-18 ENCOUNTER — Inpatient Hospital Stay (HOSPITAL_COMMUNITY): Payer: Medicaid Other | Admitting: Anesthesiology

## 2014-08-18 ENCOUNTER — Encounter: Payer: Self-pay | Admitting: *Deleted

## 2014-08-18 ENCOUNTER — Encounter (HOSPITAL_COMMUNITY): Payer: Self-pay

## 2014-08-18 ENCOUNTER — Inpatient Hospital Stay (HOSPITAL_COMMUNITY)
Admission: RE | Admit: 2014-08-18 | Discharge: 2014-08-20 | DRG: 775 | Disposition: A | Discharge status: Home / Self Care | Payer: Medicaid Other | Source: Ambulatory Visit | Attending: Obstetrics & Gynecology | Admitting: Obstetrics & Gynecology

## 2014-08-18 DIAGNOSIS — O99824 Streptococcus B carrier state complicating childbirth: Secondary | ICD-10-CM | POA: Diagnosis present

## 2014-08-18 DIAGNOSIS — K831 Obstruction of bile duct: Secondary | ICD-10-CM | POA: Diagnosis present

## 2014-08-18 DIAGNOSIS — O2662 Liver and biliary tract disorders in childbirth: Principal | ICD-10-CM | POA: Diagnosis present

## 2014-08-18 DIAGNOSIS — O9952 Diseases of the respiratory system complicating childbirth: Secondary | ICD-10-CM | POA: Diagnosis present

## 2014-08-18 DIAGNOSIS — Z3A37 37 weeks gestation of pregnancy: Secondary | ICD-10-CM | POA: Diagnosis present

## 2014-08-18 DIAGNOSIS — J45909 Unspecified asthma, uncomplicated: Secondary | ICD-10-CM | POA: Diagnosis present

## 2014-08-18 DIAGNOSIS — O09523 Supervision of elderly multigravida, third trimester: Secondary | ICD-10-CM

## 2014-08-18 HISTORY — DX: Personal history of other mental and behavioral disorders: Z86.59

## 2014-08-18 HISTORY — DX: Personal history of other diseases of the nervous system and sense organs: Z86.69

## 2014-08-18 HISTORY — DX: Asymptomatic varicose veins of unspecified lower extremity: I83.90

## 2014-08-18 HISTORY — DX: Personal history of other complications of pregnancy, childbirth and the puerperium: Z87.59

## 2014-08-18 HISTORY — DX: Carbuncle, unspecified: L02.93

## 2014-08-18 HISTORY — DX: Personal history of diseases of the blood and blood-forming organs and certain disorders involving the immune mechanism: Z86.2

## 2014-08-18 LAB — CBC
HEMATOCRIT: 37.6 % (ref 36.0–46.0)
HEMOGLOBIN: 13 g/dL (ref 12.0–15.0)
MCH: 32.2 pg (ref 26.0–34.0)
MCHC: 34.6 g/dL (ref 30.0–36.0)
MCV: 93.1 fL (ref 78.0–100.0)
Platelets: 134 10*3/uL — ABNORMAL LOW (ref 150–400)
RBC: 4.04 MIL/uL (ref 3.87–5.11)
RDW: 13.9 % (ref 11.5–15.5)
WBC: 8.1 10*3/uL (ref 4.0–10.5)

## 2014-08-18 LAB — TYPE AND SCREEN
ABO/RH(D): O POS
ANTIBODY SCREEN: NEGATIVE

## 2014-08-18 MED ORDER — OXYTOCIN 40 UNITS IN LACTATED RINGERS INFUSION - SIMPLE MED
1.0000 m[IU]/min | INTRAVENOUS | Status: DC
  Administered 2014-08-18: 2 m[IU]/min via INTRAVENOUS
  Filled 2014-08-18: qty 1000

## 2014-08-18 MED ORDER — TERBUTALINE SULFATE 1 MG/ML IJ SOLN: 0.2500 mg | Freq: Once | INTRAMUSCULAR | Status: AC | PRN

## 2014-08-18 MED ORDER — OXYCODONE-ACETAMINOPHEN 5-325 MG PO TABS: 1.0000 | ORAL_TABLET | ORAL | Status: DC | PRN

## 2014-08-18 MED ORDER — ONDANSETRON HCL 4 MG/2ML IJ SOLN: 4.0000 mg | Freq: Four times a day (QID) | INTRAMUSCULAR | Status: DC | PRN

## 2014-08-18 MED ORDER — LIDOCAINE HCL (PF) 1 % IJ SOLN
INTRAMUSCULAR | Status: DC | PRN
Start: 2014-08-18 — End: 2014-08-22
  Administered 2014-08-18 (×2): 4 mL

## 2014-08-18 MED ORDER — LACTATED RINGERS IV SOLN
INTRAVENOUS | Status: DC
  Administered 2014-08-18: 08:00:00 via INTRAVENOUS

## 2014-08-18 MED ORDER — FENTANYL 2.5 MCG/ML BUPIVACAINE 1/10 % EPIDURAL INFUSION (WH - ANES)
14.0000 mL/h | INTRAMUSCULAR | Status: DC | PRN
  Administered 2014-08-18 (×2): 14 mL/h via EPIDURAL
  Filled 2014-08-18 (×2): qty 125

## 2014-08-18 MED ORDER — PANTOPRAZOLE SODIUM 40 MG PO TBEC: 40.0000 mg | DELAYED_RELEASE_TABLET | Freq: Every day | ORAL | Status: DC

## 2014-08-18 MED ORDER — EPHEDRINE 5 MG/ML INJ
10.0000 mg | INTRAVENOUS | Status: DC | PRN
  Filled 2014-08-18: qty 2

## 2014-08-18 MED ORDER — ACETAMINOPHEN 325 MG PO TABS: 650.0000 mg | ORAL_TABLET | ORAL | Status: DC | PRN

## 2014-08-18 MED ORDER — FLUTICASONE PROPIONATE HFA 44 MCG/ACT IN AERO
2.0000 | INHALATION_SPRAY | Freq: Two times a day (BID) | RESPIRATORY_TRACT | Status: DC
  Administered 2014-08-18 – 2014-08-19 (×3): 2 via RESPIRATORY_TRACT
  Filled 2014-08-18: qty 10.6

## 2014-08-18 MED ORDER — OXYTOCIN 40 UNITS IN LACTATED RINGERS INFUSION - SIMPLE MED: 62.5000 mL/h | INTRAVENOUS | Status: DC

## 2014-08-18 MED ORDER — PENICILLIN G POTASSIUM 5000000 UNITS IJ SOLR
2.5000 10*6.[IU] | INTRAVENOUS | Status: DC
  Administered 2014-08-18 – 2014-08-19 (×4): 2.5 10*6.[IU] via INTRAVENOUS
  Filled 2014-08-18 (×8): qty 2.5

## 2014-08-18 MED ORDER — DIPHENHYDRAMINE HCL 50 MG/ML IJ SOLN: 12.5000 mg | INTRAMUSCULAR | Status: DC | PRN

## 2014-08-18 MED ORDER — LACTATED RINGERS IV SOLN: 500.0000 mL | INTRAVENOUS | Status: DC | PRN

## 2014-08-18 MED ORDER — PHENYLEPHRINE 40 MCG/ML (10ML) SYRINGE FOR IV PUSH (FOR BLOOD PRESSURE SUPPORT)
80.0000 ug | PREFILLED_SYRINGE | INTRAVENOUS | Status: DC | PRN
  Filled 2014-08-18: qty 20
  Filled 2014-08-18: qty 2

## 2014-08-18 MED ORDER — EPHEDRINE 5 MG/ML INJ
10.0000 mg | INTRAVENOUS | Status: DC | PRN
  Filled 2014-08-18: qty 4
  Filled 2014-08-18: qty 2

## 2014-08-18 MED ORDER — ALBUTEROL SULFATE (2.5 MG/3ML) 0.083% IN NEBU: 3.0000 mL | INHALATION_SOLUTION | Freq: Four times a day (QID) | RESPIRATORY_TRACT | Status: DC | PRN

## 2014-08-18 MED ORDER — OXYTOCIN BOLUS FROM INFUSION
500.0000 mL | INTRAVENOUS | Status: DC
  Administered 2014-08-19: 500 mL via INTRAVENOUS

## 2014-08-18 MED ORDER — URSODIOL 300 MG PO CAPS
300.0000 mg | ORAL_CAPSULE | Freq: Three times a day (TID) | ORAL | Status: DC
  Administered 2014-08-18: 300 mg via ORAL
  Filled 2014-08-18 (×4): qty 1

## 2014-08-18 MED ORDER — PENICILLIN G POTASSIUM 5000000 UNITS IJ SOLR
5.0000 10*6.[IU] | Freq: Once | INTRAVENOUS | Status: AC
  Administered 2014-08-18: 5 10*6.[IU] via INTRAVENOUS
  Filled 2014-08-18: qty 5

## 2014-08-18 MED ORDER — CITRIC ACID-SODIUM CITRATE 334-500 MG/5ML PO SOLN: 30.0000 mL | ORAL | Status: DC | PRN

## 2014-08-18 MED ORDER — LACTATED RINGERS IV SOLN: 500.0000 mL | Freq: Once | INTRAVENOUS | Status: DC

## 2014-08-18 MED ORDER — LIDOCAINE HCL (PF) 1 % IJ SOLN
30.0000 mL | INTRAMUSCULAR | Status: DC | PRN
  Filled 2014-08-18: qty 30

## 2014-08-18 MED ORDER — PHENYLEPHRINE 40 MCG/ML (10ML) SYRINGE FOR IV PUSH (FOR BLOOD PRESSURE SUPPORT)
80.0000 ug | PREFILLED_SYRINGE | INTRAVENOUS | Status: DC | PRN
  Filled 2014-08-18: qty 2

## 2014-08-18 MED ORDER — OXYCODONE-ACETAMINOPHEN 5-325 MG PO TABS: 2.0000 | ORAL_TABLET | ORAL | Status: DC | PRN

## 2014-08-18 MED ORDER — HYDROXYZINE HCL 25 MG PO TABS
25.0000 mg | ORAL_TABLET | Freq: Four times a day (QID) | ORAL | Status: DC | PRN
  Filled 2014-08-18: qty 1

## 2014-08-18 MED ORDER — MOMETASONE FURO-FORMOTEROL FUM 100-5 MCG/ACT IN AERO
2.0000 | INHALATION_SPRAY | Freq: Two times a day (BID) | RESPIRATORY_TRACT | Status: DC
  Administered 2014-08-18: 2 via RESPIRATORY_TRACT
  Filled 2014-08-18: qty 8.8

## 2014-08-18 MED ORDER — FENTANYL 2.5 MCG/ML BUPIVACAINE 1/10 % EPIDURAL INFUSION (WH - ANES)
INTRAMUSCULAR | Status: DC | PRN
  Administered 2014-08-18: 14 mL/h via EPIDURAL

## 2014-08-18 NOTE — Progress Notes (Signed)
Meghan Mclean is a 39 y.o. [id number] at 5036w0d   Subjective: Feels urge to push.  Objective: BP 110/80 mmHg  Pulse 98  Temp(Src) 99.2 F (37.3 C) (Oral)  Resp 18  Ht 5' (1.524 m)  Wt 82.101 kg (181 lb)  BMI 35.35 kg/m2  SpO2 99%  LMP 12/08/2013 I/O last 3 completed shifts: In: -  Out: 900 [Urine:900] Total I/O In: -  Out: 150 [Urine:150]  FHT:  FHR: 145 bpm, variability: moderate,  accelerations:  Present,  decelerations:  Present Associated with Pushes UC:   regular SVE:   Dilation: 10 Effacement (%): 100 Station: +1 Exam by:: Meghan Mclean  Labs: Lab Results  Component Value Date   WBC 8.1 08/18/2014   HGB 13.0 08/18/2014   HCT 37.6 08/18/2014   MCV 93.1 08/18/2014   PLT 134* 08/18/2014    Assessment / Plan: Induction of labor due to Cholestasis,  progressing well on pitocin  Labor: Progressing normally. Attempted a few pushes. Will labor down and reattempt. Preeclampsia:  no signs or symptoms of toxicity Fetal Wellbeing:  Category I Pain Control:  Epidural I/D:  GBS Positive. PCN. Anticipated MOD:  NSVD  Meghan Mclean, Meghan Mclean 08/18/2014, 9:35 PM

## 2014-08-18 NOTE — Progress Notes (Signed)
I assisted Vernona RiegerLaura, RN with questions and also Raynelle FanningJulie from Pharmacy. Eda H Royal  Interpreter.

## 2014-08-18 NOTE — Progress Notes (Signed)
LABOR PROGRESS NOTE  Meghan Mclean is a 39 y.o. [id number] at 4238w0d admitted for induction of labor due to cholestasis.  Subjective: Comfortable with epidural.    Objective: BP 119/72 mmHg  Pulse 84  Temp(Src) 98.5 F (36.9 C)  Resp 16  Ht 5' (1.524 m)  Wt 181 lb (82.101 kg)  BMI 35.35 kg/m2  SpO2 99%  LMP 12/08/2013 or  Filed Vitals:   08/18/14 1701 08/18/14 1731 08/18/14 1757 08/18/14 1801  BP: 121/71 104/70  119/72  Pulse: 84 84  84  Temp:      Resp:   16   Height:      Weight:      SpO2:        Total I/O In: -  Out: 900 [Urine:900]  FHT:  FHR: 140 bpm, variability: moderate,  accelerations:  Present,  decelerations:  Absent UC:   irregular, every 2-3 minutes SVE:   Dilation: 4 Effacement (%): 70, 80 Station: -3 Exam by:: mceacher  Dilation: 4 Effacement (%): 70, 80 Cervical Position: Posterior Station: -3 Presentation: Vertex Exam by:: mceacher  Pitocin @ 12 mu/min  Labs: Lab Results  Component Value Date   WBC 8.1 08/18/2014   HGB 13.0 08/18/2014   HCT 37.6 08/18/2014   MCV 93.1 08/18/2014   PLT 134* 08/18/2014    Assessment / Plan: Induction of labor due to cholestasis,  progressing well on pitocin  Labor: No cervical change since the last check. AROM at 1820, clear fluid. IUPC placed. Continue to increase pitocin per protocol.  Fetal Wellbeing:  Category I Pain Control:  Epidural now.  Anticipated MOD:  NSVD  William DaltonMcEachern, Meghan Schnider, MD 08/18/2014, 6:23 PM

## 2014-08-18 NOTE — Progress Notes (Signed)
LABOR PROGRESS NOTE  Meghan Mclean is a 39 y.o. [id number] at 8150w0d admitted for induction of labor due to cholestasis.  Subjective: Feeling some pain with contractions.   Objective: BP 116/68 mmHg  Pulse 101  Temp(Src) 98.6 F (37 C)  Resp 16  Ht 5' (1.524 m)  Wt 181 lb (82.101 kg)  BMI 35.35 kg/m2  SpO2 97%  LMP 12/08/2013 or  Filed Vitals:   08/18/14 1136 08/18/14 1210 08/18/14 1252 08/18/14 1354  BP: 118/69 115/66 116/68   Pulse: 86 85 82 101  Temp:      Resp:      Height:      Weight:      SpO2:    97%       FHT:  FHR: 135 bpm, variability: moderate,  accelerations:  Present,  decelerations:  Absent UC:   irregular, every 5-6 minutes SVE:   Dilation: 3 Station: -3 Exam by:: Meghan AveAcosta, MD  Dilation: 3 Cervical Position: Posterior Station: -3 Presentation: Vertex Exam by:: Meghan AveAcosta, MD  Pitocin @ 8 mu/min  Labs: Lab Results  Component Value Date   WBC 8.1 08/18/2014   HGB 13.0 08/18/2014   HCT 37.6 08/18/2014   MCV 93.1 08/18/2014   PLT 134* 08/18/2014    Assessment / Plan: Induction of labor due to cholestasis,  progressing well on pitocin  Labor: Progressing on Pitocin, will continue to increase then AROM Fetal Wellbeing:  Category I Pain Control:  Epidural now.  Anticipated MOD:  NSVD  Meghan Mclean, Meghan Steidle, MD 08/18/2014, 1:57 PM

## 2014-08-18 NOTE — H&P (Signed)
LABOR ADMISSION HISTORY AND PHYSICAL  Shawnita B Marcial-Garcia is a 39 y.o. female [phone] with IUP at [redacted]w[redacted]d by LMP confirmed by 18w [place] presenting for IOL due to cholestasis and . She reports +FMs, No LOF, no VB, no blurry vision, headaches or peripheral edema, and RUQ pain.  She plans on breast feeding. She is unsure about birth control.  Dating: By LMP confirmed by 18w [place] --->  Estimated Date of Delivery: 09/08/14  Sono:    , CWD, normal anatomy, cephalic presentation, 2509g, 45% EFW   Prenatal History/Complications:  Prenatal Transfer Tool  Maternal Diabetes: No Genetic Screening: Normal Maternal Ultrasounds/Referrals: Normal Fetal Ultrasounds or other Referrals:  None Maternal Substance Abuse:  No Significant Maternal Medications:  None Significant Maternal Lab Results: Lab values include: Group B Strep positive   Past Medical History: Past Medical History  Diagnosis Date  . Asthma 2007  . Sinusitis   . Cholelithiasis     Past Surgical History: Past Surgical History  Procedure Laterality Date  . No past surgeries      Obstetrical History: OB History    Gravida Para Term Preterm AB TAB SAB Ectopic Multiple Living   0 0 1 0 7      Social History: History   Social History  . Marital Status: Married    Spouse Name: N/A  . Number of Children: N/A  . Years of Education: N/A   Social History Main Topics  . Smoking status: Never Smoker   . Smokeless tobacco: Never Used  . Alcohol Use: No  . Drug Use: No  . Sexual Activity: Not Currently   Other Topics Concern  . None   Social History Narrative    Family History: Family History  Problem Relation Age of Onset  . Asthma Daughter     Allergies: Allergies  Allergen Reactions  . Aspirin Other (See Comments)    "asthma attack"  . Ibuprofen     REACTION: asthma trigger    Prescriptions prior to admission  Medication Sig Dispense Refill Last Dose  . albuterol (PROVENTIL  HFA;VENTOLIN HFA) 108 (90 BASE) MCG/ACT inhaler Inhale 2 puffs into the lungs every 6 (six) hours as needed for wheezing or shortness of breath. 1 Inhaler 3 08/17/2014 at Unknown time  . Fluticasone-Salmeterol (ADVAIR) 100-50 MCG/DOSE AEPB Inhale 1 puff into the lungs 2 (two) times daily. 60 each 1 08/17/2014 at Unknown time  . hydrOXYzine (ATARAX/VISTARIL) 25 MG tablet Take 1 tablet (25 mg total) by mouth 4 (four) times daily as needed. 30 tablet 0 08/17/2014 at Unknown time  . [DISCONTINUED] Prenatal MV-Min-Fe Fum-FA-DHA (PRENATAL 1 PO) Take 1 tablet by mouth daily.   Taking     Review of Systems   Review of Systems  Constitutional: Negative for fever and chills.  Eyes: Negative for blurred vision.  Cardiovascular: Negative for chest pain.  Gastrointestinal: Negative for nausea, vomiting and abdominal pain.  Genitourinary: Negative for dysuria.       No VB, no discharge, good fetal movement  Skin: Positive for itching.  Neurological: Negative for headaches.    Blood pressure 120/65, pulse 85, temperature 98.6 F (37 C), resp. rate 16, height 5' (1.524 m), weight 181 lb (82.101 kg), last menstrual period 12/08/2013, currently breastfeeding. General appearance: alert, no distress and resting comfortably  Lungs: clear to auscultation bilaterally Heart: regular rate and rhythm Abdomen: soft, non-tender; bowel sounds normal Extremities: Homans sign is negative, no sign of DVT, no edema  DTR's +  2 bilateral patellar reflex Presentation: cephalic Fetal monitoringBaseline: 135 bpm, Variability: moderate, Accelerations: Reactive and Decelerations: Absent Uterine activity: no contractions Dilation: 3 Station: -3 Exam by:: Loreta AveAcosta, MD  Prenatal labs: ABO, Rh: --/--/O POS (03/28 0800) Antibody: NEG (03/28 0800) Rubella:  Immune RPR: Nonreactive (02/01 0000)  HBsAg: Negative (10/05 0000)  HIV: NONREACTIVE (10/19 2108)  GBS: Positive (03/24 0000)   1 hr Glucola 177@2 /1/16 3 hr Glucola  153/137/116 @ 06/27/14 Genetic screening: normal US @33w : normal    Results for orders placed or performed during the hospital encounter of 08/18/14 (from the past 24 hour(s))  CBC   Collection Time: 08/18/14  8:00 AM  Result Value Ref Range   WBC 8.1 4.0 - 10.5 K/uL   RBC 4.04 3.87 - 5.11 MIL/uL   Hemoglobin 13.0 12.0 - 15.0 g/dL   HCT 16.137.6 09.636.0 - 04.546.0 %   MCV 93.1 78.0 - 100.0 fL   MCH 32.2 26.0 - 34.0 pg   MCHC 34.6 30.0 - 36.0 g/dL   RDW 40.913.9 81.111.5 - 91.415.5 %   Platelets 134 (L) 150 - 400 K/uL  Type and screen   Collection Time: 08/18/14  8:00 AM  Result Value Ref Range   ABO/RH(D) O POS    Antibody Screen NEG    Sample Expiration 08/21/2014     Patient Active Problem List   Diagnosis Date Noted  . Cholestasis 08/18/2014  . AMA (advanced maternal age) multigravida 35+   . Intrahepatic cholestasis of pregnancy 05/12/2011  . Itching 05/09/2011  . Pregnancy, supervision, high-risk 04/07/2011  . Grand multipara 02/03/2011  . Carpal tunnel syndrome 02/03/2011  . Maternal asthma complicating pregnancy 02/03/2011  . Asthma 01/27/2011  . Headache 07/20/2009  . ANEMIA 03/18/2009  . PICA 12/15/2008  . ALLERGIC RHINITIS 10/22/2008  . BOILS, RECURRENT 10/22/2008    Assessment: Susann GivensHerlinda B Marcial-Garcia is a 39 y.o. [id number] at 5253w0d here for IOL due to cholestasis   #Labor: IOL due to cholestasis => cervix favorable, pitocin with AROM when able #Pain: Currently no pain and not on pain medication  #FWB: Category I  #ID: GBS+ PenG prophylaxis  #MOF: Breast  #MOC: Unsure  #Circ: Girl  Ashanty Coltrane ROCIO 08/18/2014, 11:37 AM

## 2014-08-18 NOTE — Progress Notes (Signed)
I was present with Suzette BattiestVeronica RN , Dr Ane PaymentMcEachern, and [facility] Res[facility] patient was trying to push but the Dr decided to put labor down for now, by Orlan LeavensViria Alvarez Spanish Interpreter

## 2014-08-18 NOTE — Anesthesia Procedure Notes (Signed)
Epidural Patient location during procedure: OB Start time: 08/18/2014 1:54 PM  Staffing Anesthesiologist: Karie SchwalbeJUDD, Meghan Mclean Performed by: anesthesiologist   Preanesthetic Checklist Completed: patient identified, site marked, surgical consent, pre-op evaluation, timeout performed, IV checked, risks and benefits discussed and monitors and equipment checked  Epidural Patient position: sitting Prep: site prepped and draped and DuraPrep Patient monitoring: continuous pulse ox and blood pressure Approach: midline Location: L3-L4 Injection technique: LOR saline  Needle:  Needle type: Tuohy  Needle gauge: 17 G Needle length: 9 cm and 9 Needle insertion depth: 6 cm Catheter type: closed end flexible Catheter size: 19 Gauge Catheter at skin depth: 10 cm Test dose: negative  Assessment Events: blood not aspirated, injection not painful, no injection resistance, negative IV test and no paresthesia  Additional Notes Patient identified. Risks/Benefits/Options discussed with patient including but not limited to bleeding, infection, nerve damage, paralysis, failed block, incomplete pain control, headache, blood pressure changes, nausea, vomiting, reactions to medication both or allergic, itching and postpartum back pain. Confirmed with bedside nurse the patient's most recent platelet count. Confirmed with patient that they are not currently taking any anticoagulation, have any bleeding history or any family history of bleeding disorders. Patient expressed understanding and wished to proceed. All questions were answered. Sterile technique was used throughout the entire procedure. Please see nursing notes for vital signs. Test dose was given through epidural catheter and negative prior to continuing to dose epidural or start infusion. Warning signs of high block given to the patient including shortness of breath, tingling/numbness in hands, complete motor block, or any concerning symptoms with instructions to  call for help. Patient was given instructions on fall risk and not to get out of bed. All questions and concerns addressed with instructions to call with any issues or inadequate analgesia.

## 2014-08-18 NOTE — Progress Notes (Signed)
I stopped by to check on patients need, I ordered her snack, by Orlan LeavensViria Mclean Spanish Interpreter

## 2014-08-18 NOTE — Progress Notes (Signed)
LABOR PROGRESS NOTE  Meghan Mclean is a 39 y.o. [id number] at 2452w0d admitted for induction of labor due to cholestasis.  Subjective: Comfortable with epidural.    Objective: BP 125/79 mmHg  Pulse 89  Temp(Src) 98.6 F (37 C)  Resp 18  Ht 5' (1.524 m)  Wt 181 lb (82.101 kg)  BMI 35.35 kg/m2  SpO2 99%  LMP 12/08/2013 or  Filed Vitals:   08/18/14 1421 08/18/14 1426 08/18/14 1429 08/18/14 1431  BP: 123/76 133/75  125/79  Pulse: 90 90 89 89  Temp:      Resp:      Height:      Weight:      SpO2:   99%        FHT:  FHR: 130 bpm, variability: moderate,  accelerations:  Present,  decelerations:  Absent UC:   irregular, every 3-5 minutes SVE:   Dilation: 4 Effacement (%): 70, 80 Station: -3 Exam by:: mceachen  Dilation: 4 Effacement (%): 70, 80 Cervical Position: Posterior Station: -3 Presentation: Vertex Exam by:: mceachen  Pitocin @ 12 mu/min  Labs: Lab Results  Component Value Date   WBC 8.1 08/18/2014   HGB 13.0 08/18/2014   HCT 37.6 08/18/2014   MCV 93.1 08/18/2014   PLT 134* 08/18/2014    Assessment / Plan: Induction of labor due to cholestasis,  progressing well on pitocin  Labor: Progressing on Pitocin, will continue to increase then AROM once able Fetal Wellbeing:  Category I Pain Control:  Epidural now.  Anticipated MOD:  NSVD  William DaltonMcEachern, Lanice Folden, MD 08/18/2014, 3:06 PM

## 2014-08-18 NOTE — Progress Notes (Signed)
I assisted Dr. Angelica ChessmanMc Eachern. With explanation of care plan.Kipp Laurence. Eda H Royal  Interpreter.

## 2014-08-18 NOTE — Progress Notes (Signed)
Meghan GivensHerlinda B Mclean is a 39 y.o. [id number] at 2822w0d  Subjective: States epidural is not effective, however she does not wish for another attempt at epidural placement. Would like to continue pushing.  Objective: BP 118/67 mmHg  Pulse 116  Temp(Src) 99.2 F (37.3 C) (Oral)  Resp 18  Ht 5' (1.524 m)  Wt 82.101 kg (181 lb)  BMI 35.35 kg/m2  SpO2 99%  LMP 12/08/2013 I/O last 3 completed shifts: In: -  Out: 900 [Urine:900] Total I/O In: -  Out: 150 [Urine:150]  FHT:  FHR: 150 bpm, variability: moderate,  accelerations:  Present,  decelerations:  Present Variables Noted UC:   regular SVE:   Dilation: 10 Effacement (%): 100 Station: +1 Exam by:: Meghan Mclean  Labs: Lab Results  Component Value Date   WBC 8.1 08/18/2014   HGB 13.0 08/18/2014   HCT 37.6 08/18/2014   MCV 93.1 08/18/2014   PLT 134* 08/18/2014    Assessment / Plan: Induction of labor due to Cholestasis,  progressing well on pitocin  Labor: Progressing normally. Continue Pitocin.  Preeclampsia:  no signs or symptoms of toxicity Fetal Wellbeing:  Category I Pain Control:  Epidural ineffective. Does not wish for further pain medications at this time.  I/D:  GBS positive. PCN. Anticipated MOD:  NSVD  Meghan Mclean 08/18/2014, 11:47 PM

## 2014-08-18 NOTE — Anesthesia Preprocedure Evaluation (Signed)
Anesthesia Evaluation  Patient identified by MRN, date of birth, ID band Patient awake    Reviewed: Allergy & Precautions, NPO status , Patient's Chart, lab work & pertinent test results  History of Anesthesia Complications Negative for: history of anesthetic complications  Airway Mallampati: II  TM Distance: >3 FB Neck ROM: Full    Dental no notable dental hx. (+) Dental Advisory Given   Pulmonary asthma ,  breath sounds clear to auscultation  Pulmonary exam normal       Cardiovascular negative cardio ROS  Rhythm:Regular Rate:Normal     Neuro/Psych  Headaches, PSYCHIATRIC DISORDERS (PICA)    GI/Hepatic negative GI ROS, Neg liver ROS,   Endo/Other  obesity  Renal/GU negative Renal ROS  negative genitourinary   Musculoskeletal negative musculoskeletal ROS (+)   Abdominal   Peds negative pediatric ROS (+)  Hematology  (+) anemia ,   Anesthesia Other Findings   Reproductive/Obstetrics (+) Pregnancy                             Anesthesia Physical Anesthesia Plan  ASA: II  Anesthesia Plan: Epidural   Post-op Pain Management:    Induction:   Airway Management Planned:   Additional Equipment:   Intra-op Plan:   Post-operative Plan:   Informed Consent: I have reviewed the patients History and Physical, chart, labs and discussed the procedure including the risks, benefits and alternatives for the proposed anesthesia with the patient or authorized representative who has indicated his/her understanding and acceptance.   Dental advisory given  Plan Discussed with:   Anesthesia Plan Comments:         Anesthesia Quick Evaluation

## 2014-08-19 ENCOUNTER — Encounter (HOSPITAL_COMMUNITY): Payer: Self-pay

## 2014-08-19 DIAGNOSIS — O99824 Streptococcus B carrier state complicating childbirth: Secondary | ICD-10-CM

## 2014-08-19 DIAGNOSIS — K831 Obstruction of bile duct: Secondary | ICD-10-CM

## 2014-08-19 DIAGNOSIS — Z3A37 37 weeks gestation of pregnancy: Secondary | ICD-10-CM

## 2014-08-19 DIAGNOSIS — O2662 Liver and biliary tract disorders in childbirth: Secondary | ICD-10-CM

## 2014-08-19 LAB — CBC
HCT: 34.6 % — ABNORMAL LOW (ref 36.0–46.0)
Hemoglobin: 12.1 g/dL (ref 12.0–15.0)
MCH: 32.6 pg (ref 26.0–34.0)
MCHC: 35 g/dL (ref 30.0–36.0)
MCV: 93.3 fL (ref 78.0–100.0)
Platelets: 122 10*3/uL — ABNORMAL LOW (ref 150–400)
RBC: 3.71 MIL/uL — ABNORMAL LOW (ref 3.87–5.11)
RDW: 13.8 % (ref 11.5–15.5)
WBC: 13.7 10*3/uL — ABNORMAL HIGH (ref 4.0–10.5)

## 2014-08-19 LAB — RPR: RPR: NONREACTIVE

## 2014-08-19 MED ORDER — OXYCODONE-ACETAMINOPHEN 5-325 MG PO TABS: 2.0000 | ORAL_TABLET | ORAL | Status: DC | PRN

## 2014-08-19 MED ORDER — ALBUTEROL SULFATE (2.5 MG/3ML) 0.083% IN NEBU
2.5000 mg | INHALATION_SOLUTION | RESPIRATORY_TRACT | Status: DC | PRN
  Administered 2014-08-19: 2.5 mg via RESPIRATORY_TRACT
  Filled 2014-08-19: qty 3

## 2014-08-19 MED ORDER — ACETAMINOPHEN 325 MG PO TABS
650.0000 mg | ORAL_TABLET | ORAL | Status: DC | PRN
  Administered 2014-08-19 – 2014-08-20 (×4): 650 mg via ORAL
  Filled 2014-08-19 (×4): qty 2

## 2014-08-19 MED ORDER — ZOLPIDEM TARTRATE 5 MG PO TABS: 5.0000 mg | ORAL_TABLET | Freq: Every evening | ORAL | Status: DC | PRN

## 2014-08-19 MED ORDER — DIPHENHYDRAMINE HCL 25 MG PO CAPS: 25.0000 mg | ORAL_CAPSULE | Freq: Four times a day (QID) | ORAL | Status: DC | PRN

## 2014-08-19 MED ORDER — SIMETHICONE 80 MG PO CHEW: 80.0000 mg | CHEWABLE_TABLET | ORAL | Status: DC | PRN

## 2014-08-19 MED ORDER — MISOPROSTOL 200 MCG PO TABS: 1000.0000 ug | ORAL_TABLET | Freq: Once | ORAL | Status: DC

## 2014-08-19 MED ORDER — ALBUTEROL SULFATE HFA 108 (90 BASE) MCG/ACT IN AERS: 2.0000 | INHALATION_SPRAY | RESPIRATORY_TRACT | Status: DC | PRN

## 2014-08-19 MED ORDER — MISOPROSTOL 200 MCG PO TABS
ORAL_TABLET | ORAL | Status: AC
  Filled 2014-08-19: qty 5

## 2014-08-19 MED ORDER — METHYLERGONOVINE MALEATE 0.2 MG/ML IJ SOLN
0.2000 mg | INTRAMUSCULAR | Status: DC | PRN
  Administered 2014-08-19: 0.2 mg via INTRAMUSCULAR

## 2014-08-19 MED ORDER — PRENATAL MULTIVITAMIN CH
1.0000 | ORAL_TABLET | Freq: Every day | ORAL | Status: DC
  Administered 2014-08-19 – 2014-08-20 (×2): 1 via ORAL
  Filled 2014-08-19 (×2): qty 1

## 2014-08-19 MED ORDER — METHYLERGONOVINE MALEATE 0.2 MG PO TABS
0.2000 mg | ORAL_TABLET | ORAL | Status: DC | PRN
  Administered 2014-08-19 (×2): 0.2 mg via ORAL
  Filled 2014-08-19 (×2): qty 1

## 2014-08-19 MED ORDER — ONDANSETRON HCL 4 MG PO TABS: 4.0000 mg | ORAL_TABLET | ORAL | Status: DC | PRN

## 2014-08-19 MED ORDER — TETANUS-DIPHTH-ACELL PERTUSSIS 5-2.5-18.5 LF-MCG/0.5 IM SUSP: 0.5000 mL | Freq: Once | INTRAMUSCULAR | Status: DC

## 2014-08-19 MED ORDER — MISOPROSTOL 200 MCG PO TABS
1000.0000 ug | ORAL_TABLET | Freq: Once | ORAL | Status: AC
  Administered 2014-08-19: 1000 ug via RECTAL

## 2014-08-19 MED ORDER — OXYCODONE-ACETAMINOPHEN 5-325 MG PO TABS
1.0000 | ORAL_TABLET | ORAL | Status: DC | PRN
Start: 2014-08-19 — End: 2014-08-20

## 2014-08-19 MED ORDER — ONDANSETRON HCL 4 MG/2ML IJ SOLN: 4.0000 mg | INTRAMUSCULAR | Status: DC | PRN

## 2014-08-19 MED ORDER — WITCH HAZEL-GLYCERIN EX PADS: 1.0000 "application " | MEDICATED_PAD | CUTANEOUS | Status: DC | PRN

## 2014-08-19 MED ORDER — SENNOSIDES-DOCUSATE SODIUM 8.6-50 MG PO TABS
2.0000 | ORAL_TABLET | ORAL | Status: DC
  Administered 2014-08-19: 2 via ORAL
  Filled 2014-08-19: qty 2

## 2014-08-19 MED ORDER — LANOLIN HYDROUS EX OINT: TOPICAL_OINTMENT | CUTANEOUS | Status: DC | PRN

## 2014-08-19 MED ORDER — BENZOCAINE-MENTHOL 20-0.5 % EX AERO
1.0000 "application " | INHALATION_SPRAY | CUTANEOUS | Status: DC | PRN
  Filled 2014-08-19: qty 56

## 2014-08-19 MED ORDER — DIBUCAINE 1 % RE OINT: 1.0000 "application " | TOPICAL_OINTMENT | RECTAL | Status: DC | PRN

## 2014-08-19 NOTE — Progress Notes (Signed)
Patient's fundus has been consistently Firm, to the left, and approx. 1/U; md notified; came to bedside to evaluate.  Bleeding has been small all day; 2 doses of prophylatic methergine have been given; no new orders at this time; will continue to monitor.  Vivi MartensAshley Erricka Falkner RN

## 2014-08-19 NOTE — Lactation Note (Signed)
This note was copied from the chart of Girl Joda Marcial-Garcia. Lactation Consultation Note  Patient Name: Girl Honor JunesHerlinda Marcial-Garcia [id number]'WToday's Date: 08/19/2014   Initial assessment of this multipara and her newborn at 8117 hours pp.  This Spanish-speaking mom and her partner are together and [facility], mom has baby latched in sidelying position on her (L) breast.  LC able to speak Spanish and mom informs LC that she breastfed all other children for 1 year and that she also gave formula by bottle, as needed.  LC discussed LEAD cautions regarding early supplementation and encouraged mom to postpone supplement for at least 2 weeks to allow baby to learn breastfeeding and avoid some possible consequences of formula/bottle-feeding.  Mom says she knows how to hand express her milk.  LC encouraged frequent STS and cue feedings. Mom declined offer of an interpreter and states she has no questions or concerns at this time. Mom encouraged to feed baby 8-12 times/24 hours and with feeding cues. LC encouraged review of Baby and Me (Spanish) pp 9, 14 and 20-25 for STS and BF information.LC provided [employer] brochure in Spanish, and reviewed WH services and list of community and web site resources, especially [facility] website which has information available in [place]..  Maternal Data    Feeding    LATCH Score/Interventions         LATCH scores of 7 and 8 per RN assessment and LC observed baby well-latched in sidelying position             Lactation Tools Discussed/Used   STS, cue feedings, hand expression LEAD cautions   Consult Status    LC to follow-up tomorrow   Lynda RainwaterBryant, Shamekia Tippets [place] 08/19/2014, 6:30 PM

## 2014-08-19 NOTE — Anesthesia Postprocedure Evaluation (Signed)
Anesthesia Post Note  Patient: Meghan Mclean  Procedure(s) Performed: * No procedures listed *  Anesthesia type: Epidural  Patient location: Mother/Baby  Post pain: Pain level controlled  Post assessment: Post-op Vital signs reviewed  Last Vitals:  Filed Vitals:   08/19/14 0353  BP: 107/55  Pulse: 102  Temp: 37 C  Resp: 20    Post vital signs: Reviewed  Level of consciousness:alert  Complications: No apparent anesthesia complications

## 2014-08-19 NOTE — Lactation Note (Signed)
This note was copied from the chart of Meghan Mclean. Lactation Consultation Note Mom by her self, laying in side lying position. States needs interpreter. Called interpreter and will come after she sees 3 other people. Patient Name: Meghan Honor JunesHerlinda Mclean ZOXWR'UToday's Date: 08/19/2014     Maternal Data    Feeding    LATCH Score/Interventions                      Lactation Tools Discussed/Used     Consult Status      Charyl DancerCARVER, Aziah Brostrom G 08/19/2014, 3:51 PM

## 2014-08-19 NOTE — Progress Notes (Signed)
I check on patient's need I odered her meals, by Orlan LeavensViria Alvarez Spanish Interpreter

## 2014-08-19 NOTE — Progress Notes (Signed)
I stopped by to check on patient's needs and ordered her lunch.  Eda H Royal Interpreter. °

## 2014-08-19 NOTE — Progress Notes (Signed)
Called to evaluate further bleeding. Uterus noted to be +3 and firm. Mild amount of blood trickling out. Methergine series initiated, 0.2mg  injection followed by 0.2mg  PO x2 q4hr. Will continue to monitor bleeding. CBC in am.  Dr. Caroleen Hammanumley

## 2014-08-20 NOTE — Progress Notes (Signed)
UR chart review completed.  

## 2014-08-20 NOTE — Progress Notes (Signed)
I assisted Dr Dimple Caseyice with explanation of care plan for the Baby. Eda H Royal  Interpreter.

## 2014-08-20 NOTE — Discharge Summary (Signed)
Obstetric Discharge Summary Reason for Admission: induction of labor and due to chlestasis Prenatal Procedures: none Intrapartum Procedures: spontaneous vaginal delivery Postpartum Procedures: none Complications-Operative and Postpartum: none  Delivery Note At 12:34 AM a healthy female was delivered via Vaginal, Spontaneous Delivery (Presentation: ; Occiput Anterior). Nuchal cord noted. APGAR: pending. weight pending .  Placenta status: Intact, Spontaneous. Cord: 3 vessels with the following complications: None.   Anesthesia: Epidural  Episiotomy: None Lacerations: None Suture Repair: N/A Est. Blood Loss (mL): 450. Cytotec 1000mg  rectal. Will continue to monitor.  Mom to postpartum. Baby to Couplet care / Skin to Skin.  Hospital Course:  Active Problems:   Cholestasis, pt is not complaining of itching postpartum    Gearldean B Marcial-Garcia is a 39 y.o. [id number] s/p SVD with IOL due to cholestasis.  Patient was admitted to L&D.  She has postpartum course that was uncomplicated including no problems with ambulating, PO intake, urination, pain, or bleeding. The pt feels ready to go home and  will be discharged with outpatient follow-up.   Today: No acute events overnight.  Pt denies problems with ambulating, voiding or po intake.  She denies nausea or vomiting.  Pain is well controlled.  She has had flatus. She has had bowel movement.  Lochia Minimal.  Plan for birth control is  no method, trying to convince husband to tie tubes.  Method of Feeding: Breast  Physical Exam:  General: alert and no distress Lochia: appropriate Uterine Fundus: firm DVT Evaluation: No evidence of DVT seen on physical exam. Negative Homan's sign.  Minimum bilateral peripheral edema noted  H/H: Lab Results  Component Value Date/Time   HGB 12.1 08/19/2014 05:55 AM   HGB 11.7 06/23/2014   HCT 34.6* 08/19/2014 05:55 AM   HCT 36 06/23/2014    Discharge Diagnoses: Term  Pregnancy-delivered  Discharge Information: Date: 08/20/2014 Activity: pelvic rest Diet: routine  Medications: Ibuprofen Breast feeding:  Yes Condition: stable Instructions: refer to handout Discharge to: home      Medication List    ASK your doctor about these medications        albuterol 108 (90 BASE) MCG/ACT inhaler  Commonly known as:  PROVENTIL HFA;VENTOLIN HFA  Inhale 2 puffs into the lungs every 6 (six) hours as needed for wheezing or shortness of breath.     Fluticasone-Salmeterol 100-50 MCG/DOSE Aepb  Commonly known as:  ADVAIR  Inhale 1 puff into the lungs 2 (two) times daily.     hydrOXYzine 25 MG tablet  Commonly known as:  ATARAX/VISTARIL  Take 1 tablet (25 mg total) by mouth 4 (four) times daily as needed.        Susann GivensHerlinda B Marcial-Garcia is a 39 y.o. [id number] s/p SVD with IOL due to cholestasis. Pt is doing well, ready to go home.  Maly Lemarr  08/20/2014,7:27 AM

## 2014-08-20 NOTE — Discharge Instructions (Signed)

## 2014-08-20 NOTE — Discharge Summary (Signed)
Obstetric Discharge Summary Reason for Admission: induction of labor secondary to cholestasis Prenatal Procedures: none Intrapartum Procedures: spontaneous vaginal delivery Postpartum Procedures: none Complications-Operative and Postpartum: none    HEMOGLOBIN  Date Value Ref Range Status  08/19/2014 12.1 12.0 - 15.0 g/dL Final  45/40/981102/05/2014 91.411.7 g/dL Final   HCT  Date Value Ref Range Status  08/19/2014 34.6* 36.0 - 46.0 % Final  06/23/2014 36 % Final    Physical Exam:  General: alert and cooperative Lochia: appropriate Uterine Fundus: firm Incision:  DVT Evaluation: No evidence of DVT seen on physical exam.  Discharge Diagnoses: Term Pregnancy-delivered  Discharge Information: Date: 08/20/2014 Activity: pelvic rest Diet: routine Medications: None Condition: stable Instructions: refer to practice specific booklet Discharge to: home Follow-up Information    [facility].   Specialty:  Obstetrics and Gynecology   Why:  Keep Regular Scheduled Appointment   Contact information:   [address] [place] [id number] [phone]      Newborn Data: Live born female  Birth Weight: 7 lb 9.3 oz (3439 g) APGAR: 8, 9  Home with mother.  Meghan Mclean 08/20/2014, 2:51 PM

## 2014-08-21 ENCOUNTER — Encounter: Payer: Self-pay | Admitting: Obstetrics & Gynecology

## 2014-09-18 ENCOUNTER — Ambulatory Visit: Payer: Self-pay | Admitting: Obstetrics & Gynecology

## 2014-09-18 ENCOUNTER — Ambulatory Visit (INDEPENDENT_AMBULATORY_CARE_PROVIDER_SITE_OTHER): Payer: Self-pay | Admitting: Family Medicine

## 2014-09-18 ENCOUNTER — Encounter: Payer: Self-pay | Admitting: Obstetrics & Gynecology

## 2014-09-18 MED ORDER — FAMOTIDINE 40 MG PO TABS: 40.0000 mg | ORAL_TABLET | Freq: Every day | ORAL | Status: DC

## 2014-09-18 MED ORDER — PRENATAL VITAMINS 0.8 MG PO TABS: 1.0000 | ORAL_TABLET | Freq: Every day | ORAL | Status: DC

## 2014-09-18 MED ORDER — MOMETASONE FUROATE 50 MCG/ACT NA SUSP: 2.0000 | Freq: Every day | NASAL | Status: DC

## 2014-09-18 MED ORDER — METOCLOPRAMIDE HCL 5 MG PO TABS: 5.0000 mg | ORAL_TABLET | Freq: Three times a day (TID) | ORAL | Status: DC

## 2014-09-18 MED ORDER — NORGESTIMATE-ETH ESTRADIOL 0.25-35 MG-MCG PO TABS: 1.0000 | ORAL_TABLET | Freq: Every day | ORAL | Status: DC

## 2014-09-18 NOTE — Patient Instructions (Addendum)
Nasonex: para el zyrtec, esto es para las allergias Reglan: tres veces por dia, esto es para que le puede ayudar bajar la [place] Pepcid: for heart burn   jugo de ciruela de pasa: Prune Juice

## 2014-09-18 NOTE — Progress Notes (Signed)
Reports constipation and heartburn; Mariel used for interpreter

## 2014-09-18 NOTE — Progress Notes (Signed)
Patient ID: Meghan Mclean, female   DOB: 1975/06/18, 39 y.o.   MRN: [id number] Subjective:     Meghan Mclean is a 39 y.o. female who presents for a postpartum visit. She is 4 weeks postpartum following a spontaneous vaginal delivery. I have fully reviewed the prenatal and intrapartum course. The delivery was at 6874w1d gestational weeks. Outcome: spontaneous vaginal delivery. Anesthesia: epidural. Postpartum course has been complicated by abdominal pain, worse at night. Baby's course has been normal other than occasional constipation. Baby is feeding by both breast and bottle - Carnation Good Start. Bleeding brown. Bowel function is slow; has been constipation. Bladder function is normal. Patient is not sexually active. Contraception method is vasectomy vs OCPs, reports did not tolerate paraguard, mirena fell out. Postpartum depression screening: negative.  The following portions of the patient's history were reviewed and updated as appropriate: allergies, current medications, past family history, past medical history, past social history, past surgical history and problem list.  Review of Systems Pertinent items are noted in HPI.   Objective:    There were no vitals taken for this visit.  General:  alert and cooperative     Lungs: clear to auscultation bilaterally and normal percussion bilaterally  Heart:  regular rate and rhythm, S1, S2 normal, no murmur, click, rub or gallop  Abdomen: soft, non-tender; bowel sounds normal; no masses,  no organomegaly        Assessment:     normal postpartum exam. Pap smear not done at today's visit.   Plan:    1. Contraception: OCPs.  trying to convince husband to have vasectomy, patient information in spanish given to patient, rx sprintec 2. Milk supply: discussed cessation of zyrtec, rx nasonex, frequent feedings, PO hydration, rx reglan 5mg  TID (discussed risks of EPS SE) 3. Abdominal pain: tylenol prn, allergic to ibuprofen,  likely 2/2 breastfeeding/contraction 4. Reflux: pepcid prn, requests refill of PNV 5. Follow up in: or as needed.

## 2014-10-06 ENCOUNTER — Ambulatory Visit: Payer: Self-pay | Admitting: Obstetrics & Gynecology

## 2016-06-22 ENCOUNTER — Emergency Department (HOSPITAL_COMMUNITY)
Admission: EM | Admit: 2016-06-22 | Discharge: 2016-06-22 | Disposition: A | Discharge status: Home / Self Care | Payer: Self-pay | Attending: Emergency Medicine | Admitting: Emergency Medicine

## 2016-06-22 ENCOUNTER — Encounter (HOSPITAL_COMMUNITY): Payer: Self-pay | Admitting: Emergency Medicine

## 2016-06-22 ENCOUNTER — Emergency Department (HOSPITAL_COMMUNITY): Payer: Self-pay

## 2016-06-22 DIAGNOSIS — R51 Headache: Secondary | ICD-10-CM | POA: Insufficient documentation

## 2016-06-22 DIAGNOSIS — R519 Headache, unspecified: Secondary | ICD-10-CM

## 2016-06-22 DIAGNOSIS — J45909 Unspecified asthma, uncomplicated: Secondary | ICD-10-CM | POA: Insufficient documentation

## 2016-06-22 LAB — COMPREHENSIVE METABOLIC PANEL
ALK PHOS: 118 U/L (ref 38–126)
ALT: 24 U/L (ref 14–54)
AST: 20 U/L (ref 15–41)
Albumin: 3.8 g/dL (ref 3.5–5.0)
Anion gap: 6 (ref 5–15)
BILIRUBIN TOTAL: 0.4 mg/dL (ref 0.3–1.2)
BUN: 7 mg/dL (ref 6–20)
CALCIUM: 9 mg/dL (ref 8.9–10.3)
CO2: 25 mmol/L (ref 22–32)
Chloride: 108 mmol/L (ref 101–111)
Creatinine, Ser: 0.59 mg/dL (ref 0.44–1.00)
GFR calc Af Amer: >60 mL/min (ref >60)
Glucose, Bld: 74 mg/dL (ref 65–99)
POTASSIUM: 3.2 mmol/L — AB (ref 3.5–5.1)
Sodium: 139 mmol/L (ref 135–145)
TOTAL PROTEIN: 7.4 g/dL (ref 6.5–8.1)

## 2016-06-22 LAB — CBC WITH DIFFERENTIAL/PLATELET
BASOS ABS: 0 10*3/uL (ref 0.0–0.1)
Basophils Relative: 0 %
Eosinophils Absolute: 0.5 10*3/uL (ref 0.0–0.7)
Eosinophils Relative: 5 %
HEMATOCRIT: 40.4 % (ref 36.0–46.0)
Hemoglobin: 14 g/dL (ref 12.0–15.0)
LYMPHS PCT: 30 %
Lymphs Abs: 2.8 10*3/uL (ref 0.7–4.0)
MCH: 31.5 pg (ref 26.0–34.0)
MCHC: 34.7 g/dL (ref 30.0–36.0)
MCV: 90.8 fL (ref 78.0–100.0)
MONO ABS: 0.7 10*3/uL (ref 0.1–1.0)
Monocytes Relative: 7 %
Neutro Abs: 5.4 10*3/uL (ref 1.7–7.7)
Neutrophils Relative %: 58 %
Platelets: 206 10*3/uL (ref 150–400)
RBC: 4.45 MIL/uL (ref 3.87–5.11)
RDW: 12.7 % (ref 11.5–15.5)
WBC: 9.4 10*3/uL (ref 4.0–10.5)

## 2016-06-22 LAB — I-STAT BETA HCG BLOOD, ED (MC, WL, AP ONLY): I-stat hCG, quantitative: <5 m[IU]/mL (ref <5)

## 2016-06-22 MED ORDER — CYCLOBENZAPRINE HCL 5 MG PO TABS: 5.0000 mg | ORAL_TABLET | Freq: Three times a day (TID) | ORAL | 0 refills | Status: DC | PRN

## 2016-06-22 MED ORDER — METOCLOPRAMIDE HCL 5 MG/ML IJ SOLN
10.0000 mg | Freq: Once | INTRAMUSCULAR | Status: AC
  Administered 2016-06-22: 10 mg via INTRAVENOUS
  Filled 2016-06-22: qty 2

## 2016-06-22 MED ORDER — POTASSIUM CHLORIDE CRYS ER 20 MEQ PO TBCR
40.0000 meq | EXTENDED_RELEASE_TABLET | Freq: Once | ORAL | Status: AC
  Administered 2016-06-22: 40 meq via ORAL
  Filled 2016-06-22: qty 2

## 2016-06-22 MED ORDER — DIPHENHYDRAMINE HCL 50 MG/ML IJ SOLN
12.5000 mg | Freq: Once | INTRAMUSCULAR | Status: AC
  Administered 2016-06-22: 12.5 mg via INTRAVENOUS
  Filled 2016-06-22: qty 1

## 2016-06-22 MED ORDER — BUTALBITAL-APAP-CAFFEINE 50-325-40 MG PO TABS: 1.0000 | ORAL_TABLET | Freq: Four times a day (QID) | ORAL | 0 refills | Status: AC | PRN

## 2016-06-22 MED ORDER — SODIUM CHLORIDE 0.9 % IV BOLUS (SEPSIS)
1000.0000 mL | Freq: Once | INTRAVENOUS | Status: AC
  Administered 2016-06-22: 1000 mL via INTRAVENOUS

## 2016-06-22 NOTE — ED Triage Notes (Addendum)
Pt c/o headache that started 1 week ago-- has gradually gotten worse-- could not sleep last night. No vomiting, no hx migraines, did fall 2 months ago hitting head, with loss of consciousness-- did not go to the doctor.   Pt speaks spanish-- understands a little [place].

## 2016-06-22 NOTE — ED Notes (Signed)
Patient transported to CT 

## 2016-06-22 NOTE — ED Provider Notes (Signed)
[facility]-EMERGENCY DEPT Provider Note   CSN: [id number] Arrival date & time: 06/22/16  1541   By signing my name below, I, Meghan Mclean, attest that this documentation has been prepared under the direction and in the presence of Meghan Panderavid Hsienta Eugenio Dollins, MD. Electronically Signed: Soijett Mclean, ED Scribe. 06/22/16. 5:14 PM.  History   Chief Complaint Chief Complaint  Patient presents with  . Headache    HPI Meghan Mclean is a 41 y.o. female with a PMHx of HA, who presents to the Emergency Department complaining of constant, gradually worsening, frontal, HA onset 1 week ago. Pt has tried tylenol with no relief of her symptoms. Pt states that she fell and struck her posterior head 2 months ago, but wasn't evaluated for her symptoms. Pt denies having headaches since the fall. Pt denies vomiting, fever, and any other symptoms. Pt denies any PMHx, recent foreign travel, or family hx of death due to brain aneuryms. Pt notes that she is allergic to ASA and ibuprofen.   The history is provided by the patient and a relative. A language interpreter was used (daughter).    Past Medical History:  Diagnosis Date  . Asthma 2007  . Cholelithiasis   . History of anemia   . History of ectopic pregnancy 2007  . History of pica 2010  . Hx of carpal tunnel syndrome 2012  . Recurrent boils 2010  . Sinusitis   . Varicosities     Patient Active Problem List   Diagnosis Date Noted  . Cholestasis 08/18/2014  . AMA (advanced maternal age) multigravida 35+   . Intrahepatic cholestasis of pregnancy 05/12/2011  . Itching 05/09/2011  . Pregnancy, supervision, high-risk 04/07/2011  . Grand multipara 02/03/2011  . Carpal tunnel syndrome 02/03/2011  . Maternal asthma complicating pregnancy 02/03/2011  . Asthma 01/27/2011  . Headache(784.0) 07/20/2009  . ANEMIA 03/18/2009  . PICA 12/15/2008  . ALLERGIC RHINITIS 10/22/2008  . BOILS, RECURRENT 10/22/2008    Past Surgical History:  Procedure  Laterality Date  . NO PAST SURGERIES      OB History    Gravida Para Term Preterm AB Living   9 8 7 1 1 8    SAB TAB Ectopic Multiple Live Births   0 0 1 0 7       Home Medications    Prior to Admission medications   Medication Sig Start Date End Date Taking? Authorizing Provider  famotidine (PEPCID) 40 MG tablet Take 1 tablet (40 mg total) by mouth daily. 09/18/14   Fredirick LatheKristy Acosta, MD  metoCLOPramide (REGLAN) 5 MG tablet Take 1 tablet (5 mg total) by mouth 3 (three) times daily before meals. 09/18/14   Fredirick LatheKristy Acosta, MD  mometasone (NASONEX) 50 MCG/ACT nasal spray Place 2 sprays into the nose daily. 09/18/14   Fredirick LatheKristy Acosta, MD  norgestimate-ethinyl estradiol (ORTHO-CYCLEN,SPRINTEC,PREVIFEM) 0.25-35 MG-MCG tablet Take 1 tablet by mouth daily. 09/18/14   Fredirick LatheKristy Acosta, MD  Prenatal Multivit-Min-Fe-FA (PRENATAL VITAMINS) 0.8 MG tablet Take 1 tablet by mouth daily. 09/18/14   Fredirick LatheKristy Acosta, MD    Family History Family History  Problem Relation Age of Onset  . Asthma Daughter     Social History Social History  Substance Use Topics  . Smoking status: Never Smoker  . Smokeless tobacco: Never Used  . Alcohol use No     Allergies   Aspirin and Ibuprofen   Review of Systems Review of Systems  Neurological: Positive for headaches.  All other systems reviewed and are negative.  Physical Exam Updated Vital Signs BP 101/69   Pulse 92   Temp 98.5 F (36.9 C)   Resp 20   Ht 5' (1.524 m)   Wt 168 lb (76.2 kg)   LMP 06/19/2016   SpO2 96%   BMI 32.81 kg/m   Physical Exam  Constitutional: She is oriented to person, place, and time. She appears well-developed and well-nourished. No distress.  Slightly uncomfortable appearing.   HENT:  Head: Normocephalic and atraumatic.  Mouth/Throat: Uvula is midline, oropharynx is clear and moist and mucous membranes are normal.  Eyes: EOM are normal.  Neck: Neck supple. Spinous process tenderness present.  Mild right paracervical  tenderness. Tenderness at insertion of neck muscles at posterior scalp.   Cardiovascular: Normal rate, regular rhythm and normal heart sounds.  Exam reveals no gallop and no friction rub.   No murmur heard. Pulmonary/Chest: Effort normal and breath sounds normal. No respiratory distress. She has no wheezes. She has no rales.  Abdominal: She exhibits no distension.  Musculoskeletal: Normal range of motion.  Neurological: She is alert and oriented to person, place, and time.  Nl gait.   Skin: Skin is warm and dry.  Psychiatric: She has a normal mood and affect. Her behavior is normal.  Nursing note and vitals reviewed.    ED Treatments / Results  DIAGNOSTIC STUDIES: Oxygen Saturation is 96% on RA, nl by my interpretation.    COORDINATION OF CARE: 5:14 PM Discussed treatment plan with pt at bedside which includes labs, CT head, reglan, benadryl, and pt agreed to plan.   Labs (all labs ordered are listed, but only abnormal results are displayed) Labs Reviewed  COMPREHENSIVE METABOLIC PANEL - Abnormal; Notable for the following:       Result Value   Potassium 3.2 (*)    All other components within normal limits  CBC WITH DIFFERENTIAL/PLATELET  I-STAT BETA HCG BLOOD, ED ([facility], [facility], [facility] ONLY)    Radiology Ct Head Wo Contrast  Result Date: 06/22/2016 CLINICAL DATA:  Headaches for 1 week, blurred vision, history asthma EXAM: CT HEAD WITHOUT CONTRAST TECHNIQUE: Contiguous axial images were obtained from the base of the skull through the vertex without intravenous contrast. COMPARISON:  07/23/2009 FINDINGS: Brain: Normal ventricular morphology. No midline shift or mass effect. Normal appearance of brain parenchyma. No intracranial hemorrhage, mass lesion, or evidence acute infarction. No extra-axial fluid collections. Vascular: Unremarkable Skull: Intact Sinuses/Orbits: Fluid levels in the maxillary sinuses bilaterally with mucosal thickening throughout ethmoid air cells. Mucosal thickening and  question small mild fluid within sphenoid sinus. Nasal septal deviation to the RIGHT. Other: N/A IMPRESSION: No acute intracranial abnormalities. Sinus disease changes as above. Electronically Signed   By: Ulyses Southward M.D.   On: 06/22/2016 17:33    Procedures Procedures (including critical care time)  Medications Ordered in ED Medications  sodium chloride 0.9 % bolus 1,000 mL (0 mLs Intravenous Stopped 06/22/16 1845)  metoCLOPramide (REGLAN) injection 10 mg (10 mg Intravenous Given 06/22/16 1743)  diphenhydrAMINE (BENADRYL) injection 12.5 mg (12.5 mg Intravenous Given 06/22/16 1743)  potassium chloride SA (K-DUR,KLOR-CON) CR tablet 40 mEq (40 mEq Oral Given 06/22/16 1900)     Initial Impression / Assessment and Plan / ED Course  I have reviewed the triage vital signs and the nursing notes.  Pertinent labs & imaging results that were available during my care of the patient were reviewed by me and considered in my medical decision making (see chart for details).    Venesa B Mclean is  a 41 y.o. female here with R sided headaches after head injury 2 months ago. Likely post concussive symptoms vs tension headaches as she has mild tenderness R posterior skull base where the neck muscle insert. Will get labs, CT head. Will give migraine cocktail.  7:16 PM CT head unremarkable. K 3.2, likely from dehydration. K supplemented. Pain controlled. She has NSAID allergy. Will dc home with flexeril, fioricet. Will have her see neurology.    Final Clinical Impressions(s) / ED Diagnoses   Final diagnoses:  None    New Prescriptions New Prescriptions   No medications on file   I personally performed the services described in this documentation, which was scribed in my presence. The recorded information has been reviewed and is accurate.     Meghan Pander, MD 06/22/16 [phone]

## 2016-06-22 NOTE — Discharge Instructions (Signed)
Take tylenol for pain.   Take flexeril for neck muscle strain.   Take fioricet if you have headaches.   See a neurologist  Return to ER if you have severe neck pain, headaches, vomiting, fevers, weakness, numbness

## 2016-06-22 NOTE — ED Notes (Signed)
Pt verbalized understanding of d/c instructions and has no further questions. Pt stable and NAD. Pt d/c home with daughter driving.  

## 2017-07-22 ENCOUNTER — Other Ambulatory Visit: Payer: Self-pay

## 2017-07-22 ENCOUNTER — Encounter (HOSPITAL_BASED_OUTPATIENT_CLINIC_OR_DEPARTMENT_OTHER): Payer: Self-pay | Admitting: Emergency Medicine

## 2017-07-22 ENCOUNTER — Emergency Department (HOSPITAL_BASED_OUTPATIENT_CLINIC_OR_DEPARTMENT_OTHER)
Admission: EM | Admit: 2017-07-22 | Discharge: 2017-07-22 | Disposition: A | Discharge status: Home / Self Care | Payer: Self-pay | Attending: Emergency Medicine | Admitting: Emergency Medicine

## 2017-07-22 DIAGNOSIS — Z79899 Other long term (current) drug therapy: Secondary | ICD-10-CM | POA: Insufficient documentation

## 2017-07-22 DIAGNOSIS — R519 Headache, unspecified: Secondary | ICD-10-CM

## 2017-07-22 DIAGNOSIS — J45909 Unspecified asthma, uncomplicated: Secondary | ICD-10-CM | POA: Insufficient documentation

## 2017-07-22 DIAGNOSIS — R51 Headache: Secondary | ICD-10-CM | POA: Insufficient documentation

## 2017-07-22 MED ORDER — DIPHENHYDRAMINE HCL 50 MG/ML IJ SOLN
25.0000 mg | Freq: Once | INTRAMUSCULAR | Status: AC
  Administered 2017-07-22: 25 mg via INTRAVENOUS
  Filled 2017-07-22: qty 1

## 2017-07-22 MED ORDER — SODIUM CHLORIDE 0.9 % IV BOLUS (SEPSIS)
1000.0000 mL | Freq: Once | INTRAVENOUS | Status: AC
  Administered 2017-07-22: 1000 mL via INTRAVENOUS

## 2017-07-22 MED ORDER — DIPHENHYDRAMINE HCL 50 MG/ML IJ SOLN
25.0000 mg | Freq: Once | INTRAMUSCULAR | Status: DC
  Filled 2017-07-22: qty 1

## 2017-07-22 MED ORDER — PROCHLORPERAZINE EDISYLATE 5 MG/ML IJ SOLN
10.0000 mg | Freq: Once | INTRAMUSCULAR | Status: AC
  Administered 2017-07-22: 10 mg via INTRAVENOUS
  Filled 2017-07-22: qty 2

## 2017-07-22 NOTE — Discharge Instructions (Signed)
Your evaluation today is reassuring, you may continue to use tylenol as needed for headaches, please also ensure you are drinking plenty of water, dehydration can make headaches worse. Please follow up with your primary doctor regarding these headaches, I would also like you to follow up with Neurology. Please return to the ED if any of the following scenarios develop.  Get help right away if: Your headache becomes really bad. You keep throwing up. You have a stiff neck. You have trouble seeing. You have trouble speaking. You have pain in the eye or ear. Your muscles are weak or you lose muscle control. You lose your balance or have trouble walking. You feel like you will pass out (faint) or you pass out. You have confusion.

## 2017-07-22 NOTE — ED Notes (Signed)
Pt refused second dose of benadryl. She is now much more calm and lying in bed with eyes closed but responds to voice. She sts she feels much better now. PA made aware.

## 2017-07-22 NOTE — ED Notes (Signed)
ED Provider at bedside. 

## 2017-07-22 NOTE — ED Notes (Signed)
Called to patient room. Pt reporting feeling "desperate" and chest is tight. Assessed vitals, attempted to connect to interpreter x3 without success. Askd EMT to obtain EKG and notified PA who reported to bedside.

## 2017-07-22 NOTE — ED Triage Notes (Signed)
Patient states that she has woke up the last 2 mornings with a Headache that is so severe that she throws up. Throughout the day it goes away

## 2017-07-22 NOTE — ED Provider Notes (Signed)
[facility] EMERGENCY DEPARTMENT Provider Note   CSN: [id number] Arrival date & time: 07/22/17  1026     History   Chief Complaint Chief Complaint  Patient presents with  . Headache    HPI  Meghan Mclean is a 42 y.o. Female with a history of asthma, and anemia, who presents to the ED for evaluation of headache. Pt is spanish speaking, [employer] interpreter services used to obtain history. Pt reports intermittent severe headache over the past two weeks, often comes on in the morning or evenings is severe and generalized, pt reports if "feels like pressure and like her head is swelling". Pt reports associated light sensitivity, and blurry vision, with headache that is never presents without the headache. Pt reports some neck pain today, but no neck stiffness, no fevers or chills. Pt reports yesterday she vomited due to headache, reports some intermittent nausea. Pt has been taking tylenol which improves headaches, reports NSAID allergy, took tylenol today, pt reports pain initially a 10/10, now a 7/10. On review of chart pt has been seen for similar in the past, similar presentation.      Past Medical History:  Diagnosis Date  . Asthma 2007  . Cholelithiasis   . History of anemia   . History of ectopic pregnancy 2007  . History of pica 2010  . Hx of carpal tunnel syndrome 2012  . Recurrent boils 2010  . Sinusitis   . Varicosities     Patient Active Problem List   Diagnosis Date Noted  . Cholestasis 08/18/2014  . AMA (advanced maternal age) multigravida 35+   . Intrahepatic cholestasis of pregnancy 05/12/2011  . Itching 05/09/2011  . Pregnancy, supervision, high-risk 04/07/2011  . Grand multipara 02/03/2011  . Carpal tunnel syndrome 02/03/2011  . Maternal asthma complicating pregnancy 02/03/2011  . Asthma 01/27/2011  . Headache(784.0) 07/20/2009  . ANEMIA 03/18/2009  . PICA 12/15/2008  . ALLERGIC RHINITIS 10/22/2008  . BOILS, RECURRENT 10/22/2008     Past Surgical History:  Procedure Laterality Date  . NO PAST SURGERIES      OB History    Gravida Para Term Preterm AB Living   9 8 7 1 1 8    SAB TAB Ectopic Multiple Live Births   0 0 1 0 7       Home Medications    Prior to Admission medications   Medication Sig Start Date End Date Taking? Authorizing Provider  albuterol (PROVENTIL HFA;VENTOLIN HFA) 108 (90 Base) MCG/ACT inhaler Inhale into the lungs every 6 (six) hours as needed for wheezing or shortness of breath.   Yes [provider]  fluticasone-salmeterol (ADVAIR HFA) 115-21 MCG/ACT inhaler Inhale 2 puffs into the lungs 2 (two) times daily.   Yes [provider]  cyclobenzaprine (FLEXERIL) 5 MG tablet Take 1 tablet (5 mg total) by mouth 3 (three) times daily as needed for muscle spasms. 06/22/16   Charlynne Pander, MD  famotidine (PEPCID) 40 MG tablet Take 1 tablet (40 mg total) by mouth daily. 09/18/14   Fredirick Lathe, MD  metoCLOPramide (REGLAN) 5 MG tablet Take 1 tablet (5 mg total) by mouth 3 (three) times daily before meals. 09/18/14   Fredirick Lathe, MD  mometasone (NASONEX) 50 MCG/ACT nasal spray Place 2 sprays into the nose daily. 09/18/14   Fredirick Lathe, MD  norgestimate-ethinyl estradiol (ORTHO-CYCLEN,SPRINTEC,PREVIFEM) 0.25-35 MG-MCG tablet Take 1 tablet by mouth daily. 09/18/14   Fredirick Lathe, MD  Prenatal Multivit-Min-Fe-FA (PRENATAL VITAMINS) 0.8 MG tablet Take 1 tablet by  mouth daily. 09/18/14   Fredirick LatheAcosta, Kristy, MD    Family History Family History  Problem Relation Age of Onset  . Asthma Daughter     Social History Social History   Tobacco Use  . Smoking status: Never Smoker  . Smokeless tobacco: Never Used  Substance Use Topics  . Alcohol use: No  . Drug use: No     Allergies   Aspirin and Ibuprofen   Review of Systems Review of Systems  Constitutional: Negative for chills and fever.  HENT: Negative for congestion, ear discharge, ear pain, rhinorrhea and sore throat.    Eyes: Positive for photophobia and visual disturbance (Blurry only with HA). Negative for pain.  Respiratory: Negative for cough and shortness of breath.   Cardiovascular: Negative.   Gastrointestinal: Positive for nausea and vomiting. Negative for abdominal pain, blood in stool and diarrhea.  Genitourinary: Negative for dysuria.  Musculoskeletal: Positive for neck pain. Negative for arthralgias, back pain, myalgias and neck stiffness.  Skin: Negative for rash.  Neurological: Positive for headaches. Negative for dizziness, syncope, facial asymmetry, speech difficulty, weakness, light-headedness and numbness.     Physical Exam Updated Vital Signs BP 100/75 (BP Location: Right Arm)   Pulse 89   Temp 98.1 F (36.7 C) (Oral)   Resp 20   Wt 76.2 kg (168 lb)   SpO2 100%   BMI 32.81 kg/m   Physical Exam  Constitutional: She is oriented to person, place, and time. She appears well-developed and well-nourished. No distress.  HENT:  Head: Normocephalic and atraumatic.  Mouth/Throat: Oropharynx is clear and moist.  Eyes: EOM are normal. Pupils are equal, round, and reactive to light. Right eye exhibits no discharge. Left eye exhibits no discharge.  Neck: Neck supple.  Cardiovascular: Normal rate, regular rhythm, normal heart sounds and intact distal pulses.  Pulmonary/Chest: Effort normal and breath sounds normal. No respiratory distress. She has no wheezes. She has no rales.  Respirations equal and unlabored, patient able to speak in full sentences, lungs clear to auscultation bilaterally  Abdominal: Soft. Bowel sounds are normal. She exhibits no distension and no mass. There is no tenderness. There is no guarding.  Musculoskeletal: She exhibits no edema or deformity.  Neurological: She is alert and oriented to person, place, and time. Coordination normal. GCS eye subscore is 4. GCS verbal subscore is 5. GCS motor subscore is 6.  Speech is clear, able to follow commands CN III-XII  intact Normal strength in upper and lower extremities bilaterally including dorsiflexion and plantar flexion, strong and equal grip strength Sensation normal to light and sharp touch Moves extremities without ataxia, coordination intact Normal finger to nose and rapid alternating movements No pronator drift  Skin: Skin is warm and dry. Capillary refill takes less than 2 seconds. She is not diaphoretic.  Psychiatric: She has a normal mood and affect. Her behavior is normal.  Nursing note and vitals reviewed.    ED Treatments / Results  Labs (all labs ordered are listed, but only abnormal results are displayed) Labs Reviewed - No data to display  EKG  EKG Interpretation  Date/Time:  Saturday July 22 2017 13:54:46 EST Ventricular Rate:  111 PR Interval:    QRS Duration: 85 QT Interval:  355 QTC Calculation: 483 R Axis:   -137 Text Interpretation:  Sinus tachycardia Aberrant conduction of SV complex(es) Markedly posterior QRS axis Borderline T abnormalities, anterior leads Baseline wander in lead(s) V3 NO STEMI Confirmed by Drema Pryardama, Pedro ([id number]) on 07/22/2017 2:28:30 PM  Radiology No results found.  Procedures Procedures (including critical care time)  Medications Ordered in ED Medications  prochlorperazine (COMPAZINE) injection 10 mg (10 mg Intravenous Given 07/22/17 1338)  diphenhydrAMINE (BENADRYL) injection 25 mg (25 mg Intravenous Given 07/22/17 1338)  sodium chloride 0.9 % bolus 1,000 mL (0 mLs Intravenous Stopped 07/22/17 1447)     Initial Impression / Assessment and Plan / ED Course  I have reviewed the triage vital signs and the nursing notes.  Pertinent labs & imaging results that were available during my care of the patient were reviewed by me and considered in my medical decision making (see chart for details).  Presentation is like pts typical HA and non concerning for [facility], ICH, Meningitis, or temporal arteritis. Pt is afebrile with no focal neuro deficits,  nuchal rigidity, or change in vision. Pain already improved somewhat after tylenol prior to arrival. Discussed pt with Dr. Eudelia Bunch, patient has been evaluated previously with similar presentation, negative head CT and normal labs at that time.  Will give headache cocktail and reevaluate the patient.  1:57 PM notified by nursing staff that shortly after receiving Compazine patient started to feel panicked complaining of chest tightness, dizziness, and feeling like she is having difficulty breathing, lungs are clear there is no evidence of facial swelling or oropharyngeal edema, vitals remained stable, EKG without concerning changes, this can be a common reaction with Compazine will give additional 25 of Benadryl and monitor patient closely.  2:02 PM Notified by nursing patient feels completely normal now, and has refused additional benadryl  3:14 PM on reevaluation after headache cocktail, headache is completely resolved and patient is feeling better.  Patient is stable for discharge at this time.  Encourage patient to follow-up with her primary care provider, will also refer patient to neurology.  Strict return precautions discussed with the patient using [employer] interpreter services, patient expresses understanding and is in agreement with plan.  Patient will continue to use Tylenol as needed for headaches until she is able to follow-up for continued management and discussion of headache prophylaxis  Final Clinical Impressions(s) / ED Diagnoses   Final diagnoses:  Bad headache    ED Discharge Orders    None       Legrand Rams 07/22/17 1905    Nira Conn, MD 07/23/17 1745

## 2018-03-15 ENCOUNTER — Encounter (HOSPITAL_COMMUNITY): Payer: Self-pay | Admitting: Emergency Medicine

## 2018-03-15 ENCOUNTER — Other Ambulatory Visit: Payer: Self-pay

## 2018-03-15 ENCOUNTER — Emergency Department (HOSPITAL_COMMUNITY)
Admission: EM | Admit: 2018-03-15 | Discharge: 2018-03-16 | Disposition: A | Discharge status: Home / Self Care | Payer: Self-pay | Attending: Emergency Medicine | Admitting: Emergency Medicine

## 2018-03-15 DIAGNOSIS — K625 Hemorrhage of anus and rectum: Secondary | ICD-10-CM | POA: Insufficient documentation

## 2018-03-15 DIAGNOSIS — Z79899 Other long term (current) drug therapy: Secondary | ICD-10-CM | POA: Insufficient documentation

## 2018-03-15 DIAGNOSIS — J45909 Unspecified asthma, uncomplicated: Secondary | ICD-10-CM | POA: Insufficient documentation

## 2018-03-15 NOTE — ED Triage Notes (Signed)
Spanish interpreter used for triage. Pt reports small amount of blood on toilet tissue when wiping x 1 week after bowel movements. Reports hx of same going back to march and told her primary care doctor, since she has only had intermittent episode but reports that this has been ongoing daily x 1 week.

## 2018-03-16 ENCOUNTER — Encounter: Payer: Self-pay | Admitting: Gastroenterology

## 2018-03-16 LAB — POC OCCULT BLOOD, ED: FECAL OCCULT BLD: NEGATIVE

## 2018-03-16 LAB — I-STAT CHEM 8, ED
BUN: <3 mg/dL — ABNORMAL LOW (ref 6–20)
CHLORIDE: 105 mmol/L (ref 98–111)
Calcium, Ion: 1.2 mmol/L (ref 1.15–1.40)
Creatinine, Ser: 0.5 mg/dL (ref 0.44–1.00)
Glucose, Bld: 82 mg/dL (ref 70–99)
HCT: 41 % (ref 36.0–46.0)
Hemoglobin: 13.9 g/dL (ref 12.0–15.0)
POTASSIUM: 3.6 mmol/L (ref 3.5–5.1)
SODIUM: 142 mmol/L (ref 135–145)
TCO2: 27 mmol/L (ref 22–32)

## 2018-03-16 NOTE — ED Provider Notes (Signed)
TIME SEEN: 12:01 AM  CHIEF COMPLAINT: Rectal bleeding  HPI: Patient is a 42 year old female with no significant past medical history who presents to the emergency department for rectal bleeding that has been intermittent since March.  States that has been more constant over the past week but is only with bowel movements.  She notices a small amount of bright red blood on the toilet paper.  No blood in the toilet or on the stool.  No clots.  No melena.  No vomiting.  No abdominal pain.  No fever.  Not on antiplatelets or anticoagulants.  She denies being constipated.  Has had some rectal discomfort.  ROS: See HPI Constitutional: no fever  Eyes: no drainage  ENT: no runny nose   Cardiovascular:  no chest pain  Resp: no SOB  GI: no vomiting GU: no dysuria Integumentary: no rash  Allergy: no hives  Musculoskeletal: no leg swelling  Neurological: no slurred speech ROS otherwise negative  PAST MEDICAL HISTORY/PAST SURGICAL HISTORY:  Past Medical History:  Diagnosis Date  . Asthma 2007  . Cholelithiasis   . History of anemia   . History of ectopic pregnancy 2007  . History of pica 2010  . Hx of carpal tunnel syndrome 2012  . Recurrent boils 2010  . Sinusitis   . Varicosities     MEDICATIONS:  Prior to Admission medications   Medication Sig Start Date End Date Taking? Authorizing Provider  albuterol (PROVENTIL HFA;VENTOLIN HFA) 108 (90 Base) MCG/ACT inhaler Inhale into the lungs every 6 (six) hours as needed for wheezing or shortness of breath.    [provider]  cyclobenzaprine (FLEXERIL) 5 MG tablet Take 1 tablet (5 mg total) by mouth 3 (three) times daily as needed for muscle spasms. 06/22/16   Charlynne Pander, MD  famotidine (PEPCID) 40 MG tablet Take 1 tablet (40 mg total) by mouth daily. 09/18/14   Fredirick Lathe, MD  fluticasone-salmeterol (ADVAIR HFA) [phone] MCG/ACT inhaler Inhale 2 puffs into the lungs 2 (two) times daily.    [provider]   metoCLOPramide (REGLAN) 5 MG tablet Take 1 tablet (5 mg total) by mouth 3 (three) times daily before meals. 09/18/14   Fredirick Lathe, MD  mometasone (NASONEX) 50 MCG/ACT nasal spray Place 2 sprays into the nose daily. 09/18/14   Fredirick Lathe, MD  norgestimate-ethinyl estradiol (ORTHO-CYCLEN,SPRINTEC,PREVIFEM) 0.25-35 MG-MCG tablet Take 1 tablet by mouth daily. 09/18/14   Fredirick Lathe, MD  Prenatal Multivit-Min-Fe-FA (PRENATAL VITAMINS) 0.8 MG tablet Take 1 tablet by mouth daily. 09/18/14   Fredirick Lathe, MD    ALLERGIES:  Allergies  Allergen Reactions  . Aspirin Other (See Comments)    "asthma attack"  . Ibuprofen     REACTION: asthma trigger    SOCIAL HISTORY:  Social History   Tobacco Use  . Smoking status: Never Smoker  . Smokeless tobacco: Never Used  Substance Use Topics  . Alcohol use: No    FAMILY HISTORY: Family History  Problem Relation Age of Onset  . Asthma Daughter     EXAM: BP 109/77 (BP Location: Right Arm)   Pulse 76   Temp 98.1 F (36.7 C) (Temporal)   Resp 20   LMP  (Within Years)   SpO2 99%  CONSTITUTIONAL: Alert and oriented and responds appropriately to questions. Well-appearing; well-nourished HEAD: Normocephalic EYES: Conjunctivae clear, pupils appear equal, EOMI ENT: normal nose; moist mucous membranes NECK: Supple, no meningismus, no nuchal rigidity, no LAD  CARD: RRR; S1 and S2 appreciated; no murmurs,  no clicks, no rubs, no gallops RESP: Normal chest excursion without splinting or tachypnea; breath sounds clear and equal bilaterally; no wheezes, no rhonchi, no rales, no hypoxia or respiratory distress, speaking full sentences ABD/GI: Normal bowel sounds; non-distended; soft, non-tender, no rebound, no guarding, no peritoneal signs, no hepatosplenomegaly RECTAL:  Normal rectal tone, no gross blood or melena, guaiac negative, no hemorrhoids appreciated, nontender rectal exam, no fecal impaction BACK:  The back appears normal and is  non-tender to palpation, there is no CVA tenderness EXT: Normal ROM in all joints; non-tender to palpation; no edema; normal capillary refill; no cyanosis, no calf tenderness or swelling    SKIN: Normal color for age and race; warm; no rash NEURO: Moves all extremities equally PSYCH: The patient's mood and manner are appropriate. Grooming and personal hygiene are appropriate.  MEDICAL DECISION MAKING: Patient here with rectal bleeding.  Describes the bleeding is very mild with only blood seen on the toilet paper after bowel movements.  No bleeding in between bowel movements.  No bleeding currently.  Hemoglobin is 13.9.  She is hemodynamically stable.  Benign abdominal exam.  No external hemorrhoids appreciated on exam and rectal exam is nontender without signs of cellulitis or abscess.  I recommended outpatient follow-up with gastroenterology if symptoms continue.  This could be from internal hemorrhoids.  I feel patient is safe to be discharged.  She is comfortable with this plan.  At this time, I do not feel there is any life-threatening condition present. I have reviewed and discussed all results (EKG, imaging, lab, urine as appropriate) and exam findings with patient/family. I have reviewed nursing notes and appropriate previous records.  I feel the patient is safe to be discharged home without further emergent workup and can continue workup as an outpatient as needed. Discussed usual and customary return precautions. Patient/family verbalize understanding and are comfortable with this plan.  Outpatient follow-up has been provided if needed. All questions have been answered.      Ward, Layla Maw, DO 03/16/18 [phone]

## 2018-03-16 NOTE — Discharge Instructions (Addendum)
Your labs today were normal with normal blood counts.  You had no blood in your stool today.  Please follow-up with GI as an outpatient.

## 2018-03-16 NOTE — ED Notes (Signed)
Patient verbalizes understanding of discharge instructions. Opportunity for questioning and answers were provided. Armband removed by staff, pt discharged from ED ambulatory.   

## 2018-03-21 ENCOUNTER — Encounter: Payer: Self-pay | Admitting: Gastroenterology

## 2018-03-21 ENCOUNTER — Ambulatory Visit: Payer: Self-pay | Admitting: Gastroenterology

## 2018-03-21 VITALS — BP 90/60 | HR 80 | Ht 60.25 in | Wt 165.2 lb

## 2018-03-21 DIAGNOSIS — R12 Heartburn: Secondary | ICD-10-CM

## 2018-03-21 DIAGNOSIS — K625 Hemorrhage of anus and rectum: Secondary | ICD-10-CM

## 2018-03-21 DIAGNOSIS — K5900 Constipation, unspecified: Secondary | ICD-10-CM

## 2018-03-21 DIAGNOSIS — R1032 Left lower quadrant pain: Secondary | ICD-10-CM

## 2018-03-21 MED ORDER — NA SULFATE-K SULFATE-MG SULF 17.5-3.13-1.6 GM/177ML PO SOLN: 1.0000 | Freq: Once | ORAL | 0 refills | Status: DC

## 2018-03-21 MED ORDER — OMEPRAZOLE 40 MG PO CPDR: 40.0000 mg | DELAYED_RELEASE_CAPSULE | Freq: Every day | ORAL | 11 refills | Status: DC

## 2018-03-21 MED ORDER — NA SULFATE-K SULFATE-MG SULF 17.5-3.13-1.6 GM/177ML PO SOLN: 1.0000 | Freq: Once | ORAL | 0 refills | Status: AC

## 2018-03-21 NOTE — Patient Instructions (Signed)
We have sent the following medications to your pharmacy for you to pick up at your convenience: omeprazole.   You have been scheduled for a colonoscopy. Please follow written instructions given to you at your visit today.  Please pick up your prep supplies at the pharmacy within the next 1-3 days. If you use inhalers (even only as needed), please bring them with you on the day of your procedure. Your physician has requested that you go to www.startemmi.com and enter the access code given to you at your visit today. This web site gives a general overview about your procedure. However, you should still follow specific instructions given to you by our office regarding your preparation for the procedure.

## 2018-03-21 NOTE — Progress Notes (Signed)
Meghan Mclean    [id number]    Feb 19, 1976  Primary Care Physician:List, Nash Shearer, FNP  Referring Physician: No referring provider defined for this encounter.  Chief complaint:  Rectal bleeding  HPI: 58 yr F here with intermittent bright red blood per rectum for past 4 months.  She has chronic constipation. LLQ abd Pain X 3 weeks with worsening bleeding HeartburnX1 week, pepcid only worked for 2 weeks and she stopped as it was ineffective  She was in ER 03/15/18  BMP, Hgb/HCT normal range and fecal heme occult negative   Denies any nausea, vomiting, loss of appetite or weight loss  No family history of colon cancer  Outpatient Encounter Medications as of 03/21/2018  Medication Sig  . albuterol (PROVENTIL HFA;VENTOLIN HFA) 108 (90 Base) MCG/ACT inhaler Inhale into the lungs every 6 (six) hours as needed for wheezing or shortness of breath.  . calcium elemental as carbonate (BARIATRIC TUMS ULTRA) 400 MG chewable tablet Chew 1,000 mg by mouth as needed for heartburn.  . cetirizine (ZYRTEC) 10 MG chewable tablet Chew 10 mg by mouth daily.  . fluticasone-salmeterol (ADVAIR HFA) 115-21 MCG/ACT inhaler Inhale 2 puffs into the lungs 2 (two) times daily.  . mometasone (NASONEX) 50 MCG/ACT nasal spray Place 2 sprays into the nose daily.  . [DISCONTINUED] cyclobenzaprine (FLEXERIL) 5 MG tablet Take 1 tablet (5 mg total) by mouth 3 (three) times daily as needed for muscle spasms.  . [DISCONTINUED] famotidine (PEPCID) 40 MG tablet Take 1 tablet (40 mg total) by mouth daily.  . [DISCONTINUED] metoCLOPramide (REGLAN) 5 MG tablet Take 1 tablet (5 mg total) by mouth 3 (three) times daily before meals.  . [DISCONTINUED] norgestimate-ethinyl estradiol (ORTHO-CYCLEN,SPRINTEC,PREVIFEM) 0.25-35 MG-MCG tablet Take 1 tablet by mouth daily.  . [DISCONTINUED] Prenatal Multivit-Min-Fe-FA (PRENATAL VITAMINS) 0.8 MG tablet Take 1 tablet by mouth daily.   No facility-administered  encounter medications on file as of 03/21/2018.     Allergies as of 03/21/2018 - Review Complete 03/21/2018  Allergen Reaction Noted  . Aspirin Other (See Comments) 02/17/2010  . Ibuprofen  04/08/2009    Past Medical History:  Diagnosis Date  . Asthma 2007  . Cholelithiasis   . History of anemia   . History of ectopic pregnancy 2007  . History of pica 2010  . Hx of carpal tunnel syndrome 2012  . Recurrent boils 2010  . Sinusitis   . Varicosities     Past Surgical History:  Procedure Laterality Date  . NO PAST SURGERIES      Family History  Problem Relation Age of Onset  . Asthma Daughter     Social History   Socioeconomic History  . Marital status: Married    Spouse name: Not on file  . Number of children: Not on file  . Years of education: Not on file  . Highest education level: Not on file  Occupational History  . Not on file  Social Needs  . Financial resource strain: Not on file  . Food insecurity:    Worry: Not on file    Inability: Not on file  . Transportation needs:    Medical: Not on file    Non-medical: Not on file  Tobacco Use  . Smoking status: Never Smoker  . Smokeless tobacco: Never Used  Substance and Sexual Activity  . Alcohol use: No  . Drug use: No  . Sexual activity: Not Currently  Lifestyle  . Physical activity:  Days per week: Not on file    Minutes per session: Not on file  . Stress: Not on file  Relationships  . Social connections:    Talks on phone: Not on file    Gets together: Not on file    Attends religious service: Not on file    Active member of club or organization: Not on file    Attends meetings of clubs or organizations: Not on file    Relationship status: Not on file  . Intimate partner violence:    Fear of current or ex partner: Not on file    Emotionally abused: Not on file    Physically abused: Not on file    Forced sexual activity: Not on file  Other Topics Concern  . Not on file  Social History  Narrative  . Not on file      Review of systems: Review of Systems  Constitutional: Negative for fever and chills.  HENT: Positive for sinus trouble Eyes: Negative for blurred vision.  Respiratory: Negative for shortness of breath and wheezing.  Positive for cough Cardiovascular: Negative for chest pain and palpitations.  Gastrointestinal: as per HPI Genitourinary: Negative for dysuria, urgency, frequency and hematuria.  Musculoskeletal: Negative for myalgias, back pain and joint pain.  Skin: Negative for itching and rash.  Neurological: Negative for dizziness, tremors, focal weakness, seizures and loss of consciousness.  Positive for headaches Endo/Heme/Allergies: Positive for seasonal allergies.  Psychiatric/Behavioral: Negative for depression, suicidal ideas and hallucinations.  All other systems reviewed and are negative.   Physical Exam: Vitals:   03/21/18 0831  BP: 90/60  Pulse: 80   Body mass index is 32.01 kg/m. Gen:      No acute distress HEENT:  EOMI, sclera anicteric Neck:     No masses; no thyromegaly Lungs:    Clear to auscultation bilaterally; normal respiratory effort CV:         Regular rate and rhythm; no murmurs Abd:      + bowel sounds; soft, non-tender; no palpable masses, no distension Ext:    No edema; adequate peripheral perfusion Skin:      Warm and dry; no rash Neuro: alert and oriented x 3 Psych: normal mood and affect Rectal exam: Normal anal sphincter tone, no anal fissure or external hemorrhoids Anoscopy: Small internal hemorrhoids, no active bleeding, normal dentate line, no visible nodules  Data Reviewed:  Reviewed labs, radiology imaging, old records and pertinent past GI work up   Assessment and Plan/Recommendations:  65 yr F with c/o LLQ abd pain, worsening constipation and rectal bleeding  Will schedule for colonoscopy to exclude any neoplastic lesion The risks and benefits as well as alternatives of endoscopic procedure(s) have  been discussed and reviewed. All questions answered. The patient agrees to proceed.  Increase dietary fiber and fluid intake If persistent constipation, will start Miralax 1 capful daily  Heartburn likely secondary to GERD Omeprazole 40 mg daily Antireflux measures     K. Scherry Ran , MD [phone]    CC: No ref. provider found

## 2018-03-23 ENCOUNTER — Encounter: Payer: Self-pay | Admitting: Gastroenterology

## 2018-03-23 ENCOUNTER — Ambulatory Visit (AMBULATORY_SURGERY_CENTER): Payer: Self-pay | Admitting: Gastroenterology

## 2018-03-23 VITALS — BP 106/77 | HR 66 | Temp 98.2°F | Resp 25 | Ht 60.0 in | Wt 165.0 lb

## 2018-03-23 DIAGNOSIS — K633 Ulcer of intestine: Secondary | ICD-10-CM

## 2018-03-23 DIAGNOSIS — K6389 Other specified diseases of intestine: Secondary | ICD-10-CM

## 2018-03-23 DIAGNOSIS — K625 Hemorrhage of anus and rectum: Secondary | ICD-10-CM

## 2018-03-23 MED ORDER — SODIUM CHLORIDE 0.9 % IV SOLN: 500.0000 mL | Freq: Once | INTRAVENOUS | Status: DC

## 2018-03-23 NOTE — Progress Notes (Signed)
Report given to PACU, vss 

## 2018-03-23 NOTE — Progress Notes (Signed)
Called to room to assist during endoscopic procedure.  Patient ID and intended procedure confirmed with present staff. Received instructions for my participation in the procedure from the performing physician.  

## 2018-03-23 NOTE — Op Note (Signed)
[facility] Patient Name: Meghan Mclean Procedure Date: 03/23/2018 2:11 PM MRN: [id number] Endoscopist: Napoleon Form , MD Age: 42 Referring MD:  Date of Birth: 1976-04-26 Gender: Female Account #: [id number] Procedure:                Colonoscopy Indications:              Evaluation of unexplained GI bleeding Medicines:                Monitored Anesthesia Care Procedure:                Pre-Anesthesia Assessment:                           - Prior to the procedure, a History and Physical                            was performed, and patient medications and                            allergies were reviewed. The patient's tolerance of                            previous anesthesia was also reviewed. The risks                            and benefits of the procedure and the sedation                            options and risks were discussed with the patient.                            All questions were answered, and informed consent                            was obtained. Prior Anticoagulants: The patient has                            taken no previous anticoagulant or antiplatelet                            agents. ASA Grade Assessment: II - A patient with                            mild systemic disease. After reviewing the risks                            and benefits, the patient was deemed in                            satisfactory condition to undergo the procedure.                           After obtaining informed consent, the colonoscope  was passed under direct vision. Throughout the                            procedure, the patient's blood pressure, pulse, and                            oxygen saturations were monitored continuously. The                            Model PCF-[id number]) scope was introduced                            through the anus and advanced to the the terminal                            ileum,  with identification of the appendiceal                            orifice and IC valve. The colonoscopy was performed                            without difficulty. The patient tolerated the                            procedure well. The quality of the bowel                            preparation was excellent. The terminal ileum,                            ileocecal valve, appendiceal orifice, and rectum                            were photographed. Scope In: 2:16:57 PM Scope Out: 2:29:01 PM Scope Withdrawal Time: 0 hours 9 minutes 53 seconds  Total Procedure Duration: 0 hours 12 minutes 4 seconds  Findings:                 The perianal and digital rectal examinations were                            normal.                           The terminal ileum contained a few one mm ulcers.                            No bleeding was present. Biopsies were taken with a                            cold forceps for histology.                           Non-bleeding internal hemorrhoids were found during  retroflexion. The hemorrhoids were small.                           The colon (entire examined portion) appeared normal                            otherthan mild erythema in the rectum. Biopsies                            were taken with a cold forceps from rectum for                            histology. Complications:            No immediate complications. Estimated Blood Loss:     Estimated blood loss was minimal. Impression:               - A few ulcers in the terminal ileum. Biopsied.                           - Non-bleeding internal hemorrhoids.                           - The entire examined colon is normal. Mild                            erythema in the rectum. Biopsied. Recommendation:           - Patient has a contact number available for                            emergencies. The signs and symptoms of potential                            delayed complications  were discussed with the                            patient. Return to normal activities tomorrow.                            Written discharge instructions were provided to the                            patient.                           - Resume previous diet.                           - Continue present medications.                           - Await pathology results.                           - Repeat colonoscopy in 10 years for surveillance  based on pathology results.                           - Use Benefiber one teaspoon PO TID indefinitely.                           - Miralax 1 capful (17 grams) in 8 ounces of water                            PO PRN indefinitely. Napoleon Form, MD 03/23/2018 2:34:38 PM This report has been signed electronically.

## 2018-03-23 NOTE — Patient Instructions (Signed)
USTED TUVO UN PROCEDIMIENTO ENDOSCPICO HOY EN EL [facility]:   Lea el informe del procedimiento que se le entreg para cualquier pregunta especfica sobre lo que se Dentist.  Si el informe del examen no responde a sus preguntas, por favor llame a su gastroenterlogo para aclararlo.  Si usted solicit que no se le den [employer] de lo que se Clinical cytogeneticist en su procedimiento al [employer] va a cuidar, entonces el informe del procedimiento se ha incluido en un sobre sellado para que usted lo revise despus cuando le sea ms conveniente.   LO QUE PUEDE ESPERAR: Algunas sensaciones de hinchazn en el abdomen.  Puede tener ms gases de lo normal.  El caminar puede ayudarle a eliminar el aire que se le puso en el tracto gastrointestinal durante el procedimiento y reducir la hinchazn.  Si le hicieron una endoscopia inferior (como una colonoscopia o una sigmoidoscopia flexible), podra notar manchas de sangre en las heces fecales o en el papel higinico.  Si se someti a una preparacin intestinal para su procedimiento, es posible que no tenga una evacuacin intestinal normal durante [employer].   Tenga en cuenta:  Es posible que note un poco de irritacin y congestin en la nariz o algn drenaje.  Esto es debido al oxgeno [employer] durante su procedimiento.  No hay que preocuparse y esto debe desaparecer ms o Regulatory affairs officer.   SNTOMAS PARA REPORTAR INMEDIATAMENTE:  Despus de una endoscopia inferior (colonoscopia o sigmoidoscopia flexible):  Cantidades excesivas de sangre en las heces fecales  Sensibilidad significativa o empeoramiento de los dolores abdominales   Hinchazn aguda del abdomen que antes no tena   Fiebre de 100F o ms     Para asuntos urgentes o de Associate Professor, puede comunicarse con un gastroenterlogo a cualquier hora llamando al [phone].  DIETA:  Recomendamos una comida pequea al principio, pero luego puede continuar con su dieta normal.   Tome muchos lquidos, Tax adviser las bebidas alcohlicas durante 24 horas.    ACTIVIDAD:  Debe planear tomarse las cosas con calma por el resto del da y no debe CONDUCIR ni usar maquinaria pesada Patent examiner (debido a los medicamentos de sedacin utilizados durante el examen).     SEGUIMIENTO: Nuestro personal llamar al nmero que aparece en su historial al siguiente da hbil de su procedimiento para ver cmo se siente y para responder cualquier pregunta o inquietud que pueda tener con respecto a la informacin que se le dio despus del procedimiento. Si no podemos contactarle, le dejaremos un mensaje.  Sin embargo, si se siente bien y no tiene English as a second language teacher, no es necesario que nos devuelva la llamada.  Asumiremos que ha regresado a sus actividades diarias normales sin incidentes. Si se le tomaron algunas biopsias, le contactaremos por telfono o por carta en las prximas 3 semanas.  Si no ha sabido [employer] biopsias en el transcurso de 3 semanas, por favor llmenos al [phone].   FIRMAS/CONFIDENCIALIDAD: Usted y/o el acompaante que le cuide han firmado documentos que se ingresarn en su historial mdico electrnico.  Estas firmas atestiguan el hecho de que la informacin anterior   [place] one teaspoon three times daily indefinitely.  Miralax 1 capful in 8 oz. Of water as needed indefinitely.  Pt. Has interpreter and care partner with her today.  InterpreInterpreter used today at the [facility] for this pt.  Interpreter's name is- Child psychotherapist

## 2018-03-26 ENCOUNTER — Telehealth: Payer: Self-pay

## 2018-03-26 NOTE — Telephone Encounter (Signed)
  Follow up Call-  Call back number 03/23/2018  Post procedure Call Back phone  # [phone] (will understand how are you?)  Permission to leave phone message Yes  Some recent data might be hidden     Patient questions:  Do you have a fever, pain , or abdominal swelling? No. Pain Score  0 *  Have you tolerated food without any problems? Yes.    Have you been able to return to your normal activities? Yes.    Do you have any questions about your discharge instructions: Diet   No. Medications  No. Follow up visit  No.  Do you have questions or concerns about your Care? Yes.    Actions: * If pain score is 4 or above: Physician/ provider Notified : Marsa Aris, MD.

## 2018-03-26 NOTE — Telephone Encounter (Signed)
Colonoscopy was done to evaluate the abdominal pain and rectal bleeding.  I saw few ulcers in terminal ileum and rectal erythema, biopsies were taken to exclude IBD.  Pathology report is pending. Please advise her to avoid NSAIDs, okay to use Tylenol. Can take IBgard 1 capsule 3 times daily as needed. Beth please schedule follow-up office visit, next available with myself or with an extender.  Thank you

## 2018-03-26 NOTE — Telephone Encounter (Signed)
Scheduled with Ruby. Return appointment 04/25/18. Declined earlier appointment with APP. Advised Tylenol for pain. Avoid NSAIDs. Can try IBgard.

## 2018-03-30 ENCOUNTER — Encounter: Payer: Self-pay | Admitting: Gastroenterology

## 2018-04-25 ENCOUNTER — Ambulatory Visit: Payer: Self-pay | Admitting: Gastroenterology

## 2018-06-18 ENCOUNTER — Encounter: Payer: Self-pay | Admitting: Physician Assistant

## 2018-06-18 ENCOUNTER — Other Ambulatory Visit (INDEPENDENT_AMBULATORY_CARE_PROVIDER_SITE_OTHER): Payer: Self-pay

## 2018-06-18 ENCOUNTER — Ambulatory Visit (INDEPENDENT_AMBULATORY_CARE_PROVIDER_SITE_OTHER): Payer: Self-pay | Admitting: Physician Assistant

## 2018-06-18 VITALS — BP 104/60 | HR 72 | Ht 60.75 in | Wt 170.0 lb

## 2018-06-18 DIAGNOSIS — R12 Heartburn: Secondary | ICD-10-CM

## 2018-06-18 DIAGNOSIS — R1030 Lower abdominal pain, unspecified: Secondary | ICD-10-CM

## 2018-06-18 LAB — COMPREHENSIVE METABOLIC PANEL
ALT: 25 U/L (ref 0–35)
AST: 16 U/L (ref 0–37)
Albumin: 4.2 g/dL (ref 3.5–5.2)
Alkaline Phosphatase: 106 U/L (ref 39–117)
BUN: 10 mg/dL (ref 6–23)
CO2: 28 meq/L (ref 19–32)
Calcium: 9.1 mg/dL (ref 8.4–10.5)
Chloride: 106 mEq/L (ref 96–112)
Creatinine, Ser: 0.6 mg/dL (ref 0.40–1.20)
GFR: 109.71 mL/min (ref >60.00)
Glucose, Bld: 96 mg/dL (ref 70–99)
Potassium: 3.7 mEq/L (ref 3.5–5.1)
SODIUM: 139 meq/L (ref 135–145)
Total Bilirubin: 0.5 mg/dL (ref 0.2–1.2)
Total Protein: 7.2 g/dL (ref 6.0–8.3)

## 2018-06-18 LAB — CBC WITH DIFFERENTIAL/PLATELET
Basophils Absolute: 0 10*3/uL (ref 0.0–0.1)
Basophils Relative: 0.3 % (ref 0.0–3.0)
Eosinophils Absolute: 0.4 10*3/uL (ref 0.0–0.7)
Eosinophils Relative: 5.8 % — ABNORMAL HIGH (ref 0.0–5.0)
HCT: 42.5 % (ref 36.0–46.0)
Hemoglobin: 14.6 g/dL (ref 12.0–15.0)
Lymphocytes Relative: 33.3 % (ref 12.0–46.0)
Lymphs Abs: 2.1 10*3/uL (ref 0.7–4.0)
MCHC: 34.4 g/dL (ref 30.0–36.0)
MCV: 92.9 fl (ref 78.0–100.0)
MONO ABS: 0.4 10*3/uL (ref 0.1–1.0)
Monocytes Relative: 6.5 % (ref 3.0–12.0)
Neutro Abs: 3.5 10*3/uL (ref 1.4–7.7)
Neutrophils Relative %: 54.1 % (ref 43.0–77.0)
PLATELETS: 206 10*3/uL (ref 150.0–400.0)
RBC: 4.57 Mil/uL (ref 3.87–5.11)
RDW: 12.8 % (ref 11.5–15.5)
WBC: 6.4 10*3/uL (ref 4.0–10.5)

## 2018-06-18 LAB — LIPASE: LIPASE: 13 U/L (ref 11.0–59.0)

## 2018-06-18 MED ORDER — OMEPRAZOLE 40 MG PO CPDR: 40.0000 mg | DELAYED_RELEASE_CAPSULE | Freq: Every day | ORAL | 5 refills | Status: DC

## 2018-06-18 NOTE — Progress Notes (Signed)
Chief Complaint: Abdominal pain, Heartburn  HPI:     Meghan Mclean is a 43 year old Hispanic female with a past medical history as listed below, known to Dr. Lavon Paganini, who presents to clinic today with a complaint of lower abdominal pain and heartburn.    03/23/2018 colonoscopy for GI bleeding with a few ulcers in the terminal ileum, nonbleeding internal hemorrhoids and otherwise normal exam with mild erythema in the rectum.  Was recommended patient use Benefiber 1 teaspoon p.o. 3 times daily indefinitely and MiraLAX 1 capful in 8 ounces of water p.o. as needed indefinitely.  Repeat colonoscopy was recommended in 10 years.  Pathology showed colonic mucosa with lymphoid aggregates in the ileum and normal tissue in the rectum.    Today, patient presents to clinic accompanied by an interpreter.  She explains that for the past 2 weeks she has been experiencing a bilateral lower abdominal pain which seems worse after eating greasy or spicy foods.  This seems to bother her more when she is lying down to sleep at night and can last for an hour at a time.  Has also noticed some bruising along her lower abdomen over the past week or so which is "off-and-on", cannot remember trauma to this area.  Has been using a fiber supplement, per PCP recs, which seems to allow her to have daily solid bowel movements.    Also complains of heartburn, previously on Omeprazole 40 mg daily which she stopped "a while ago" because the prescription stopped.    Medical history positive for asthma for which she uses an inhaler.    Denies fever, chills, weight loss, melena, anorexia, nausea or vomiting.  Past Medical History:  Diagnosis Date  . Asthma 2007  . Cholelithiasis   . History of anemia   . History of ectopic pregnancy 2007  . History of pica 2010  . Hx of carpal tunnel syndrome 2012  . Recurrent boils 2010  . Sinusitis   . Varicosities     Past Surgical History:  Procedure Laterality Date  . NO PAST  SURGERIES      Current Outpatient Medications  Medication Sig Dispense Refill  . albuterol (PROVENTIL HFA;VENTOLIN HFA) 108 (90 Base) MCG/ACT inhaler Inhale into the lungs every 6 (six) hours as needed for wheezing or shortness of breath.    . calcium elemental as carbonate (BARIATRIC TUMS ULTRA) 400 MG chewable tablet Chew 1,000 mg by mouth as needed for heartburn.    . cetirizine (ZYRTEC) 10 MG chewable tablet Chew 10 mg by mouth daily.    . fluticasone-salmeterol (ADVAIR HFA) 115-21 MCG/ACT inhaler Inhale 2 puffs into the lungs 2 (two) times daily.    . mometasone (NASONEX) 50 MCG/ACT nasal spray Place 2 sprays into the nose daily. 17 g 12  . omeprazole (PRILOSEC) 40 MG capsule Take 1 capsule (40 mg total) by mouth daily. 30 capsule 11   Current Facility-Administered Medications  Medication Dose Route Frequency Provider Last Rate Last Dose  . 0.9 %  sodium chloride infusion  500 mL Intravenous Once Napoleon Form, MD        Allergies as of 06/18/2018 - Review Complete 06/18/2018  Allergen Reaction Noted  . Other Anaphylaxis 06/18/2018  . Aspirin Other (See Comments) 02/17/2010  . Ibuprofen  04/08/2009    Family History  Problem Relation Age of Onset  . Asthma Daughter     Social History   Socioeconomic History  . Marital status: Married    Spouse name: Not on  file  . Number of children: 8  . Years of education: Not on file  . Highest education level: Not on file  Occupational History  . Not on file  Social Needs  . Financial resource strain: Not on file  . Food insecurity:    Worry: Not on file    Inability: Not on file  . Transportation needs:    Medical: Not on file    Non-medical: Not on file  Tobacco Use  . Smoking status: Never Smoker  . Smokeless tobacco: Never Used  Substance and Sexual Activity  . Alcohol use: No  . Drug use: No  . Sexual activity: Not Currently  Lifestyle  . Physical activity:    Days per week: Not on file    Minutes per  session: Not on file  . Stress: Not on file  Relationships  . Social connections:    Talks on phone: Not on file    Gets together: Not on file    Attends religious service: Not on file    Active member of club or organization: Not on file    Attends meetings of clubs or organizations: Not on file    Relationship status: Not on file  . Intimate partner violence:    Fear of current or ex partner: Not on file    Emotionally abused: Not on file    Physically abused: Not on file    Forced sexual activity: Not on file  Other Topics Concern  . Not on file  Social History Narrative  . Not on file    Review of Systems:    Constitutional: No weight loss, fever or chills Skin: No rash  Cardiovascular: No chest pain Respiratory: No SOB Gastrointestinal: See HPI and otherwise negative Genitourinary: No dysuria  Neurological: No headache, dizziness or syncope Musculoskeletal: No new muscle or joint pain Hematologic: No bleeding Psychiatric: No history of depression or anxiety   Physical Exam:  Vital signs: BP 104/60   Pulse 72   Ht 5' 0.75" (1.543 m)   Wt 170 lb (77.1 kg)   BMI 32.39 kg/m    Constitutional:   Pleasant Hispanic female appears to be in NAD, Well developed, Well nourished, alert and cooperative Respiratory: Respirations even and unlabored. Lungs clear to auscultation bilaterally.   No wheezes, crackles, or rhonchi.  Cardiovascular: Normal S1, S2. No MRG. Regular rate and rhythm. No peripheral edema, cyanosis or pallor.  Gastrointestinal:  Soft, nondistended,moderate epigastric ttp, mild b.l lower abdominal ttp. No rebound or guarding. Normal bowel sounds. No appreciable masses or hepatomegaly.+yellow bruise below and to the right of umbilicus Psychiatric:  Demonstrates good judgement and reason without abnormal affect or behaviors.  No recent labs or imaging.  Assessment: 1.  Lower abdominal pain: Heartburn and gastritis, uncertain etiology of bruising on lower  abdomen, checking lipase today; Consider IBS 2.  Heartburn: Daily, worse with greasy and spicy food; consider most likely gastritis versus H. pylori versus PUD versus other  Plan: 1.  Prescribed Omeprazole 40 mg daily, 30 to 60 minutes before breakfast #30 with 5 refills 2.  Ordered labs including CBC, CMP and lipase. 3.  Reviewed antireflux diet and lifestyle modifications. 4.  Discussed with patient that if she continues with bruising around her abdomen would recommend that she discuss this with her PCP.  If she continues with lower abdominal pain after medicine above could order a CT abdomen pelvis at patient's next visit for further evaluation 5.  Patient to follow in clinic  with me in 3 to 4 weeks or sooner if necessary.  Meghan MeekerJennifer Lemmon, PA-C [facility] 06/18/2018, 10:20 AM  Cc: List, Nash ShearerSarrin, FNP

## 2018-06-18 NOTE — Patient Instructions (Addendum)
If you are age 42 or older, your body mass index should be between 23-30. Your Body mass index is 32.39 kg/m. If this is out of the aforementioned range listed, please consider follow up with your Primary Care Provider.  If you are age 91 or younger, your body mass index should be between 19-25. Your Body mass index is 32.39 kg/m. If this is out of the aformentioned range listed, please consider follow up with your Primary Care Provider.   Your provider has requested that you go to the basement level for lab work before leaving today. Press "B" on the elevator. The lab is located at the first door on the left as you exit the elevator.  We have sent the following medications to your pharmacy for you to pick up at your convenience: Omeprazole  Follow up with me on July 19, 2018 at 10:15 am.  Thank you for choosing me and [facility].    Hyacinth Meeker, PA-C

## 2018-06-20 ENCOUNTER — Encounter: Payer: Self-pay | Admitting: Gastroenterology

## 2018-06-27 NOTE — Progress Notes (Signed)
Reviewed and agree with documentation and assessment and plan. K. Veena Elvert Cumpton , MD   

## 2018-07-19 ENCOUNTER — Ambulatory Visit: Payer: Self-pay | Admitting: Physician Assistant

## 2019-07-25 ENCOUNTER — Telehealth: Payer: Self-pay | Admitting: Physician Assistant

## 2019-07-25 ENCOUNTER — Other Ambulatory Visit: Payer: Self-pay

## 2019-07-25 MED ORDER — OMEPRAZOLE 40 MG PO CPDR: 40.0000 mg | DELAYED_RELEASE_CAPSULE | Freq: Every day | ORAL | 0 refills | Status: DC

## 2019-07-25 NOTE — Telephone Encounter (Signed)
Omeprazole

## 2020-01-13 ENCOUNTER — Telehealth: Payer: Self-pay | Admitting: Physician Assistant

## 2020-01-13 NOTE — Telephone Encounter (Signed)
Pt's daughter is requesting a call back regarding scheduling some nasal surgery.

## 2020-01-13 NOTE — Telephone Encounter (Signed)
Lm on vm for patient to return call 

## 2020-01-16 NOTE — Telephone Encounter (Signed)
Patient will need to be seen in clinic to discuss further.  She can be seen by any of the APPS that are available.  Or she can see her PCP in the interim.  Thanks-JLL

## 2020-01-16 NOTE — Telephone Encounter (Signed)
Spoke with patient , advied that we do not perform nasal surgeries as we are the GI office. She reports left sided abdominal pain that has been going on for about a month, she states that pain is worse when she is lying down and hurts with pressure. Pt denies any nausea, vomiting, or diarrhea. Pt states that she only takes her Ashtma medicines and Omeprazole and has not tried anything else for this, pts last OV was 06/18/2018, your next available is in October.  Please advise on recommendations.

## 2020-01-16 NOTE — Telephone Encounter (Signed)
Spoke with patient, she is scheduled to see Alcide Evener, NP on 01/28/20 at 1:30 pm.; Pt requested that this information be mailed to her new address which is [address] [place], [place] [address]. Advised patient that if her symptoms worsen before her appointment she should try to be seen by her PCP. Will mail appointment information.

## 2020-01-27 NOTE — Progress Notes (Signed)
01/27/2020 Meghan Mclean [id number] 1975/12/07   Chief Complaint:  LLQ pain  History of Present Illness: Meghan Mclean with a past medical history of asthma, gallstones and GERD symptoms.  No past surgical history.  She speaks Spanish therefore she presents with a [place] interpreter.  She was last seen in our office by Hyacinth Meeker, PA-C on 06/18/2018 with complaints of heartburn and lower abdominal pain.  She was prescribed omeprazole 40 mg daily an abdominal/pelvic CT was considered if her symptoms persisted. She presents today with complaints of LLQ pain which started 1 month ago.  She describes the left lower quadrant pain as sharp with associated pelvic pressure.  She reported staying in bed for 2 weeks due to the severity of her left lower quadrant abdominal pain.  She takes Tylenol intermittently for pain.  No NSAIDs.  No fever.  She has intermittent chills and sweats.  She is passing normal formed brown bowel movement 2-3 times daily.  No recent issues with constipation.  No rectal bleeding or black stools.  She has a sour taste in her mouth.  She is taking Omeprazole 40 mg once daily.  No dysphagia or classic heartburn.  No upper abdominal pain.  She reported seeing her gynecologist 5 months ago and reported having a normal pelvic exam and Pap smear.  She underwent a colonoscopy 03/23/2018 by Dr. Lavon Paganini which identified a few ulcers at the terminal ileum, mild erythema in the rectum and nonbleeding internal hemorrhoids.  Biopsies of the TI showed lymphoid aggregates without evidence of IBD.  Rectal biopsies identified benign colonic mucosa without evidence of proctitis.  Her last menstrual period was 3 months ago.  She previously had Norplant for birth control which was removed 5 or 6 months ago. She has chronic sinusitis.  She was prescribed prednisone 11/20/2019.  She developed an itchy rash several days after taking prednisone so it was discontinued.     Colonoscopy 03/23/2018 by Dr. Lavon Paganini: - A few ulcers in the terminal ileum. Biopsied. - Non-bleeding internal hemorrhoids. - The entire examined colon is normal. Mild erythema in the rectum. Biopsied. 1. Surgical [P], terminal ileum - SMALL BOWEL MUCOSA WITH LYMPHOID AGGREGATES - NO ACUTE INFLAMMATION, GRANULOMAS OR MALIGNANCY IDENTIFIED 2. Surgical [P], rectal - BENIGN COLONIC MUCOSA - NO ACTIVE INFLAMMATION OR EVIDENCE OF MICROSCOPIC COLITIS - NO HIGH GRADE DYSPLASIA OR MALIGNANCY IDENTIFIED   Current Medications, Allergies, Past Medical History, Past Surgical History, Family History and Social History were reviewed in [employer] record.   Physical Exam: BP 102/70 (BP Location: Left Arm, Patient Position: Sitting, Cuff Size: Normal)   Pulse 84   Ht 5' 0.25" (1.53 m)   Wt 179 lb 2 oz (81.3 kg)   Breastfeeding No   BMI 34.69 kg/m  LMP 3 months ago General: Well developed 44 year old female in no acute distress. Head: Normocephalic and atraumatic. Eyes: No scleral icterus. Conjunctiva pink . Ears: Normal auditory acuity. Mouth: Dentition intact. No ulcers or lesions.  Lungs: Clear throughout to auscultation. Heart: Regular rate and rhythm, no murmur. Abdomen: Soft, nondistended. Moderate Periuimbilical, RLQ and LLQ pain without rebound our guarding. No masses or hepatomegaly. Normal bowel sounds x 4 quadrants.  Rectal: Deferred. Musculoskeletal: Symmetrical with no gross deformities. Extremities: No edema. Neurological: Alert oriented x 4. No focal deficits.  Psychological: Alert and cooperative. Normal mood and affect  Assessment and Recommendations:  75.  44 year old female with left lower quadrant abdominal pain x4 weeks.  On exam today she has moderate periumbilical, right lower quadrant and left lower quadrant abdominal pain without rebound or guarding. -CBC, CMP and CRP.  Beta hCG quant -Stat abdominal/pelvic CT with oral and IV contrast to  be completed after the above lab results reviewed -Further recommendations to be determined after the above evaluation completed -Patient to present to the local emergency room if she develops worsening abdominal pain -Push fluids.  Bland diet for now. -Dicyclomine 10 mg 1 p.o. 3 times daily every 8 hours as needed  2.  GERD symptoms -Continue Omeprazole 40 mg daily

## 2020-01-28 ENCOUNTER — Encounter: Payer: Self-pay | Admitting: Nurse Practitioner

## 2020-01-28 ENCOUNTER — Ambulatory Visit (INDEPENDENT_AMBULATORY_CARE_PROVIDER_SITE_OTHER): Payer: Self-pay | Admitting: Nurse Practitioner

## 2020-01-28 ENCOUNTER — Other Ambulatory Visit (INDEPENDENT_AMBULATORY_CARE_PROVIDER_SITE_OTHER): Payer: Self-pay

## 2020-01-28 VITALS — BP 102/70 | HR 84 | Ht 60.25 in | Wt 179.1 lb

## 2020-01-28 DIAGNOSIS — R1032 Left lower quadrant pain: Secondary | ICD-10-CM

## 2020-01-28 LAB — COMPREHENSIVE METABOLIC PANEL
ALT: 23 U/L (ref 0–35)
AST: 15 U/L (ref 0–37)
Albumin: 4.5 g/dL (ref 3.5–5.2)
Alkaline Phosphatase: 92 U/L (ref 39–117)
BUN: 9 mg/dL (ref 6–23)
CO2: 24 mEq/L (ref 19–32)
Calcium: 9.3 mg/dL (ref 8.4–10.5)
Chloride: 108 mEq/L (ref 96–112)
Creatinine, Ser: 0.67 mg/dL (ref 0.40–1.20)
GFR: 95.86 mL/min (ref >60.00)
Glucose, Bld: 120 mg/dL — ABNORMAL HIGH (ref 70–99)
Potassium: 3.4 mEq/L — ABNORMAL LOW (ref 3.5–5.1)
Sodium: 140 mEq/L (ref 135–145)
Total Bilirubin: 0.6 mg/dL (ref 0.2–1.2)
Total Protein: 7.7 g/dL (ref 6.0–8.3)

## 2020-01-28 LAB — CBC WITH DIFFERENTIAL/PLATELET
Basophils Absolute: 0.1 10*3/uL (ref 0.0–0.1)
Basophils Relative: 0.9 % (ref 0.0–3.0)
Eosinophils Absolute: 0.4 10*3/uL (ref 0.0–0.7)
Eosinophils Relative: 4 % (ref 0.0–5.0)
HCT: 43.5 % (ref 36.0–46.0)
Hemoglobin: 14.9 g/dL (ref 12.0–15.0)
Lymphocytes Relative: 27.4 % (ref 12.0–46.0)
Lymphs Abs: 2.5 10*3/uL (ref 0.7–4.0)
MCHC: 34.3 g/dL (ref 30.0–36.0)
MCV: 93.1 fl (ref 78.0–100.0)
Monocytes Absolute: 0.6 10*3/uL (ref 0.1–1.0)
Monocytes Relative: 6.5 % (ref 3.0–12.0)
Neutro Abs: 5.6 10*3/uL (ref 1.4–7.7)
Neutrophils Relative %: 61.2 % (ref 43.0–77.0)
Platelets: 205 10*3/uL (ref 150.0–400.0)
RBC: 4.68 Mil/uL (ref 3.87–5.11)
RDW: 12.8 % (ref 11.5–15.5)
WBC: 9.1 10*3/uL (ref 4.0–10.5)

## 2020-01-28 LAB — HCG, QUANTITATIVE, PREGNANCY: Quantitative HCG: <0.6 m[IU]/mL

## 2020-01-28 LAB — C-REACTIVE PROTEIN: CRP: <1 mg/dL (ref 0.5–20.0)

## 2020-01-28 MED ORDER — DICYCLOMINE HCL 10 MG PO CAPS: 10.0000 mg | ORAL_CAPSULE | Freq: Three times a day (TID) | ORAL | 0 refills | Status: DC | PRN

## 2020-01-28 NOTE — Patient Instructions (Addendum)
If you are age 43 or older, your body mass index should be between 23-30. Your Body mass index is 34.69 kg/m. If this is out of the aforementioned range listed, please consider follow up with your Primary Care Provider.  If you are age 42 or younger, your body mass index should be between 19-25. Your Body mass index is 34.69 kg/m. If this is out of the aformentioned range listed, please consider follow up with your Primary Care Provider.  ______________________________________________________________________________________________________  Your provider has requested that you go to the basement level for lab work before leaving today. Press "B" on the elevator. The lab is located at the first door on the left as you exit the elevator.  ____________________________________________________________________________________________________  We have sent the following medications to your pharmacy for you to pick up at your convenience: Dicyclomine  Push fluids and remain on a bland diet.  Go to the emergency room if your abdominal pain worsens.  You are scheduled on 01/29/2020 at 2:00pm at [facility] Radiology.You should arrive 15 minutes prior to your appointment time for registration.    WARNING: IF YOU ARE ALLERGIC TO IODINE/X-RAY DYE, PLEASE NOTIFY RADIOLOGY IMMEDIATELY AT [phone]! YOU WILL BE GIVEN A 13 HOUR PREMEDICATION PREP.  1) Do not eat or drink anything after 10:19m4 hours prior to your test) 2) You have been given 2 bottles of oral contrast to drink. The solution may taste better if refrigerated, but do NOT add ice or any other liquid to this solution. Shake well before drinking.    Drink 1 bottle of contrast @ 12:00 pm (2 hours prior to your exam)  Drink 1 bottle of contrast @ 1:00 pm (1 hour prior to your exam)  You may take any medications as prescribed with a small amount of water, if necessary. If you take any of the following medications: METFORMIN, GLUCOPHAGE,  GLUCOVANCE, AVANDAMET, RIOMET, FORTAMET, ANaguaboMET, JANUMET, GLUMETZA or METAGLIP, you MAY be asked to HOLD this medication 48 hours AFTER the exam.  The purpose of you drinking the oral contrast is to aid in the visualization of your intestinal tract. The contrast solution may cause some diarrhea. Depending on your individual set of symptoms, you may also receive an intravenous injection of x-ray contrast/dye. [facility], depending on the type of exam you are having performed.  This test typically takes 30-45 minutes to complete.            ______________________________________________________________________________________________________ Due to recent changes in healthcare laws, you may see the results of your imaging and laboratory studies on MyChart before your provider has had a chance to review them.  We understand that in some cases there may be results that are confusing or concerning to you. Not all laboratory results come back in the same time frame and the provider may be waiting for multiple results in order to interpret others.  Please give [id number] hours in order for your provider to thoroughly review all the results before contacting the office for clarification of your results.  _____________________________________________________________________________________________________  [facility]

## 2020-01-29 ENCOUNTER — Ambulatory Visit (HOSPITAL_COMMUNITY)
Admission: RE | Admit: 2020-01-29 | Discharge: 2020-01-29 | Disposition: A | Discharge status: Home / Self Care | Payer: Self-pay | Source: Ambulatory Visit | Attending: Nurse Practitioner | Admitting: Nurse Practitioner

## 2020-01-29 ENCOUNTER — Other Ambulatory Visit: Payer: Self-pay

## 2020-01-29 DIAGNOSIS — R1032 Left lower quadrant pain: Secondary | ICD-10-CM | POA: Insufficient documentation

## 2020-01-29 MED ORDER — IOHEXOL 300 MG/ML  SOLN
100.0000 mL | Freq: Once | INTRAMUSCULAR | Status: AC | PRN
  Administered 2020-01-29: 100 mL via INTRAVENOUS

## 2020-01-30 ENCOUNTER — Telehealth: Payer: Self-pay | Admitting: Nurse Practitioner

## 2020-01-30 NOTE — Telephone Encounter (Signed)
Spanish speaking. See result note.

## 2020-01-31 NOTE — Telephone Encounter (Signed)
Returned call. No answer. Left a voicemail in spanish to call back and ask for me or someone in spanish.

## 2020-02-06 NOTE — Progress Notes (Signed)
Reviewed and agree with documentation and assessment and plan. K. Veena Donise Woodle , MD   

## 2020-12-03 ENCOUNTER — Encounter: Payer: Self-pay | Admitting: Nurse Practitioner

## 2020-12-03 ENCOUNTER — Ambulatory Visit (INDEPENDENT_AMBULATORY_CARE_PROVIDER_SITE_OTHER): Payer: Self-pay | Admitting: Nurse Practitioner

## 2020-12-03 VITALS — BP 116/72 | HR 62 | Ht 62.25 in | Wt 181.0 lb

## 2020-12-03 DIAGNOSIS — R1032 Left lower quadrant pain: Secondary | ICD-10-CM

## 2020-12-03 DIAGNOSIS — K219 Gastro-esophageal reflux disease without esophagitis: Secondary | ICD-10-CM

## 2020-12-03 MED ORDER — OMEPRAZOLE 40 MG PO CPDR: 40.0000 mg | DELAYED_RELEASE_CAPSULE | Freq: Every day | ORAL | 1 refills | Status: DC

## 2020-12-03 MED ORDER — DICYCLOMINE HCL 10 MG PO CAPS: 10.0000 mg | ORAL_CAPSULE | Freq: Two times a day (BID) | ORAL | 0 refills | Status: DC | PRN

## 2020-12-03 MED ORDER — FAMOTIDINE 20 MG PO TABS: 20.0000 mg | ORAL_TABLET | Freq: Every day | ORAL | 1 refills | Status: DC

## 2020-12-03 NOTE — Progress Notes (Signed)
12/03/2020 Meghan Mclean [id number] Aug 30, 1975   Chief Complaint: Acid reflux and LLQ pain   History of Present Illness: Meghan Mclean with a past medical history of asthma, gallstones and GERD symptoms.  No past surgical history.  She speaks Spanish therefore she presents with a [place] interpreter.  I last saw the patient in office on 01/28/2020 with complaints of left lower quadrant abdominal pain.  Labs done 01/28/2020 showed a WBC 9.1.  Hemoglobin 14.9.  Glucose 120.  Potassium 3.4.  Normal LFTs.  CRP less < 1.  An abdominal/pelvic CT scan with contrast 01/29/2020 showed a small hiatal hernia otherwise was negative, no acute abdominal or pelvic findings.  She was prescribed Dicyclomine as needed, is unclear if she took it or not.  She presents to our office today with complaints of increased acid reflux and left lower quadrant abdominal discomfort which started 2 months ago shortly after her Omeprazole dose was reduced from 40 down to 20 mg daily by her PCP.  She describes having acid reflux, heartburn mostly at nighttime.  She eats dinner around 5 or 6 PM, sometimes at 7 PM.  She goes to bed around 9 or 10 PM.  She often does awaken around midnight with acid reflux.  No dysphagia.  No upper abdominal pain.  She has intermittent left lower quadrant abdominal pain which she stated is similar to the her abdominal pain she had 01/2020.  She stated when she started the omeprazole 01/2020 her left lower quadrant abdominal pain resolved.  No severe abdominal pain no fever.  No NSAID use.  No alcohol use.  No constipation.  She is passing normal formed brown bowel movement daily.  No rectal bleeding or black stools.  she underwent a colonoscopy by Dr. Lavon Paganini 03/23/2018 which showed a few ulcers at the TI, biopsies showed normal small bowel mucosa with lymphoid aggregates, no evidence of IBD.   CTAP 01/29/2020: 1. No acute abdominal/pelvic findings, mass lesions or  adenopathy. 2. Small hiatal hernia.   Colonoscopy 03/23/2018 by Dr. Lavon Paganini: - A few ulcers in the terminal ileum. Biopsied. - Non-bleeding internal hemorrhoids. - The entire examined colon is normal. Mild erythema in the rectum. Biopsied. 1. Surgical [P], terminal ileum - SMALL BOWEL MUCOSA WITH LYMPHOID AGGREGATES - NO ACUTE INFLAMMATION, GRANULOMAS OR MALIGNANCY IDENTIFIED 2. Surgical [P], rectal - BENIGN COLONIC MUCOSA - NO ACTIVE INFLAMMATION OR EVIDENCE OF MICROSCOPIC COLITIS - NO HIGH GRADE DYSPLASIA OR MALIGNANCY IDENTIFIED  Current Medications, Allergies, Past Medical History, Past Surgical History, Family History and Social History were reviewed in [employer] record.   Review of Systems:   Constitutional: Negative for fever, sweats, chills or weight loss.  Respiratory: Negative for shortness of breath.   Cardiovascular: Negative for chest pain, palpitations and leg swelling.  Gastrointestinal: See HPI.  Musculoskeletal: Negative for back pain or muscle aches.  Neurological: Negative for dizziness, headaches or paresthesias.    Physical Exam: BP 116/72   Ht 5' 2.25" (1.581 m)   Wt 181 lb (82.1 kg)   BMI 32.84 kg/m  General: 45 year old female in no acute distress. Head: Normocephalic and atraumatic. Eyes: No scleral icterus. Conjunctiva pink . Ears: Normal auditory acuity. Mouth: Dentition intact. No ulcers or lesions.  Lungs: Clear throughout to auscultation. Heart: Regular rate and rhythm, no murmur. Abdomen: Soft, nontender and nondistended. No masses or hepatomegaly. Normal bowel sounds x 4 quadrants.  Rectal: Deferred  Musculoskeletal: Symmetrical with no gross deformities.  Extremities: No edema. Neurological: Alert oriented x 4. No focal deficits.  Psychological: Alert and cooperative. Normal mood and affect  Assessment and Recommendations:  35.  45 year old female with GERD symptoms, mostly nighttime reflux which recurred after  Omeprazole 40mg  dose was reduced to 20 mg daily. -Restart Omeprazole 40 mg 1 p.o. daily -GERD handout -I discussed scheduling an EGD if her symptoms persist or worsen, she is uninsured, to consider [place] financial assistance program  2.  Left lower quadrant abdominal pain, negative abdominal exam today.  CTAP 01/2020 due to having similar left lower quadrant abdominal pain was negative. -Patient to monitor her's left lower quadrant abdominal pain, patient to call our office if symptoms worsen.  She reports her left lower quadrant abdominal pain improves after taking Omeprazole, I could not explain why a PPI would reduce her left lower quadrant abdominal pain but we will monitor her response  Today's encounter included 25 minutes including precharting, chart review, history/exam, result review, face-to-face time utilized for counseling, formulating a treatment plan and documentation with the assistance of [place] Spanish interpreter

## 2020-12-03 NOTE — Patient Instructions (Signed)
MEDICAMENTO: Hemos enviado el siguiente medicamento a su farmacia para que lo recoja a su conveniencia: Omeprazol 40 MG comprimido, tomar 1 al Futures trader. Pepcid 20 MG, tome 1 a la hora de acostarse. Dicyclomine 10 MG tableta, tome 1 dos veces al da segn sea necesario para el dolor abdominal.  Seguimiento con el Dr. Lavon Paganini en 3 meses.  Llame a nuestra oficina si sus sntomas empeoran. Fue genial verte hoy! Gracias por confiarme su atencin y elegir [facility].  Arnaldo Natal, CRNP  Enfermedad de reflujo gastroesofgico en los adultos Gastroesophageal Reflux Disease, Adult  El reflujo gastroesofgico (RGE) ocurre cuando el cido del estmago sube por el tubo que conecta la boca con el estmago (esfago). Normalmente, la comida baja por el esfago y se mantiene en el [address], donde se la digiere. Cuando una persona tiene RGE, los alimentos y Contractor suelen volver al esfago. Usted puede tener una enfermedad llamada enfermedad de reflujo gastroesofgico (ERGE) si el reflujo: Sucede a menudo. Causa sntomas frecuentes o muy intensos. Causa problemas tales como dao en el esfago. Cuando esto ocurre, el esfago duele y se hincha. Con el tiempo, la ERGE puede ocasionar pequeos agujeros (lceras) en el revestimiento del esfago. Cules son las causas? Esta afeccin se debe a un problema en el msculo que se encuentra entre el esfago y [place]. Cuando este msculo est dbil o no es normal, no se cierra correctamente para impedir que los alimentos y el cido regresen Merchandiser, retail. El msculo puede debilitarse debido a lo siguiente: El consumo de [place]. [place]. Tener cierto tipo de hernia (hernia de hiato). Consumo de alcohol. Ciertos alimentos y bebidas, como caf, chocolate, cebollas y [place]. Qu incrementa el riesgo? Tener sobrepeso. Tener una enfermedad que afecta el tejido conjuntivo. Tomar antiinflamatorios no esteroideos (AINE), como el  ibuprofeno. Cules son los signos o sntomas? Acidez estomacal. Dificultad o dolor al tragar. Sensacin de Warehouse manager un bulto en la garganta. Sabor amargo en la boca. Mal aliento. Tener una gran cantidad de saliva. Estmago inflamado o con Dentist. Eructos. Dolor en el pecho. El dolor de pecho puede deberse a distintas afecciones. Asegrese de Science writer a su mdico si tiene Journalist, newspaper. Dificultad para respirar o sibilancias. Neomia Dear tos a largo plazo o tos nocturna. Desgaste de la superficie de los dientes (esmalte dental). Prdida de peso. Cmo se trata? Realizar cambios en la dieta. Tomar medicamentos. Someterse a [place]. El tratamiento depender de la gravedad de los sntomas. Siga estas instrucciones en su casa: Comida y bebida  Siga una dieta como se lo haya indicado el mdico. Es posible que deba evitar alimentos y bebidas, por ejemplo: Caf y t negro, con o sin cafena. Bebidas que contengan alcohol. Bebidas energticas y deportivas. Bebidas gaseosas (carbonatadas) y refrescos. Chocolate y cacao. Menta y esencia de [place]. Ajo y cebolla. Rbano picante. Alimentos cidos y condimentados. Estos incluyen todos los tipos de pimientos, [place] en polvo, curry en polvo, vinagre, salsas picantes y [employer] Pet[employer]. Ctricos y sus jugos, por ejemplo, naranjas, limones y limas. Alimentos que [employer]. Estos incluyen salsa roja, [place], salsa picante y pizza con salsa de [place]. Alimentos fritos y Lexicographer. Estos incluyen donas, papas fritas, papitas fritas de bolsa y aderezos con alto contenido de [place] Carnes con alto contenido de [place] Estas incluye los perros calientes, chuletas o costillas, embutidos, jamn y tocino. Productos lcteos ricos en grasas, como leche [place], [place] y [place] crema. Consuma pequeas cantidades de comida con ms frecuencia. Evite consumir porciones  abundantes. Evite beber grandes cantidades de lquidos con las comidas. Evite comer 2 o 3  horas antes de acostarse. Evite recostarse inmediatamente despus de comer. No haga ejercicios enseguida despus de comer.  Estilo de vida  No fume ni consuma ningn producto que contenga nicotina o tabaco. Si necesita ayuda para dejar de consumir estos productos, consulte al mdico. Intente reducir el nivel de estrs. Si necesita ayuda para hacer esto, consulte al mdico. Si tiene sobrepeso, baje una cantidad de peso saludable para usted. Consulte a su mdico para bajar de peso de [employer].  Instrucciones generales Est atento a cualquier cambio en los sntomas. Tome los medicamentos de venta libre y los recetados solamente como se lo haya indicado el mdico. No tome aspirina, ibuprofeno ni otros antiinflamatorios no esteroideos (AINE) a menos que el mdico lo autorice. Use ropa holgada. No use nada apretado alrededor de la cintura. Levante (eleve) la cabecera de la cama aproximadamente 6 pulgadas (15 cm). Para hacerlo, es posible que tenga que utilizar una cua. Evite inclinarse si al hacerlo empeoran los sntomas. Cumpla con todas las visitas de seguimiento. Comunquese con un mdico si: Aparecen nuevos sntomas. Adelgaza y no sabe por qu. Tiene problemas para tragar o le duele cuando traga. Tiene sibilancias o tos persistente. Tiene la voz ronca. Los sntomas no mejoran con Scientist, research (medical). Solicite ayuda de inmediato si: Electronics engineer repentino [employer], el cuello, la [place], los dientes o la espalda. De repente se siente transpirado, mareado o aturdido. Siente falta de aire o Journalist, newspaper. Vomita y el vmito es de color verde, amarillo o negro, o tiene un aspecto similar a la sangre o a los posos de caf. Se desmaya. Las heces (deposiciones) son rojas, sanguinolentas o negras. No puede tragar, beber o comer. Estos sntomas pueden representar un problema grave que constituye Radio broadcast assistant. No espere a ver si los sntomas desaparecen. Solicite atencin mdica de  inmediato. Comunquese con el servicio de emergencias de su localidad (911 en los [place]). No conduzca por sus propios medios [employer]. Resumen Si una persona tiene enfermedad de reflujo gastroesofgico (ERGE), los alimentos y el cido estomacal suben al esfago y causan sntomas o problemas tales como dao en el esfago. El tratamiento depender de la gravedad de los sntomas. Siga una dieta como se lo haya indicado el mdico. Use todos los medicamentos solamente como se lo haya indicado el mdico. Esta informacin no tiene Theme park manager el consejo del mdico. Asegresede hacerle al mdico cualquier pregunta que tenga. Document Revised: 12/20/2019 Document Reviewed: 12/20/2019 Elsevier Patient Education  2022 [employer].

## 2020-12-07 ENCOUNTER — Telehealth: Payer: Self-pay | Admitting: Nurse Practitioner

## 2020-12-07 MED ORDER — OMEPRAZOLE 40 MG PO CPDR: 40.0000 mg | DELAYED_RELEASE_CAPSULE | Freq: Every day | ORAL | 1 refills | Status: DC

## 2020-12-07 NOTE — Telephone Encounter (Signed)
RX sent to requested pharmacy. 

## 2021-01-18 NOTE — Progress Notes (Signed)
Reviewed and agree with documentation and assessment and plan. K. Veena Anderson [place] , MD   

## 2021-01-21 ENCOUNTER — Ambulatory Visit (INDEPENDENT_AMBULATORY_CARE_PROVIDER_SITE_OTHER): Payer: Self-pay

## 2021-01-21 DIAGNOSIS — Z3201 Encounter for pregnancy test, result positive: Secondary | ICD-10-CM

## 2021-01-21 LAB — POCT PREGNANCY, URINE: Preg Test, Ur: POSITIVE — AB

## 2021-01-21 NOTE — Progress Notes (Signed)
Pt dropped off urine today for UPT. UPT is positive. Pt called with results with interpreter Eda R. Spoke with pt. Pt given results and recommendations. Pt scheduled for new OB intake on 9/8 2 1015am. Pt agreeable to date and time of appt. Pt also was seen at ER at [facility] and had early OB US. Pt states having nausea and vomiting . Pt was given Rx Phenergan at [facility]. Pt advised ok to take. Pt verbalized understanding.   Pt denies any vaginal bleeding, abd pain or cramps at this time. Pt advised to start taking PNV.  Pt verbalized understanding and agreeable to plan of care.   LMP: 11/29/2020  EDC: 09/05/2021 [redacted]w[redacted]d   Judeth Cornfield, RN

## 2021-01-21 NOTE — Progress Notes (Signed)
Patient was assessed and managed by nursing staff during this encounter. I have reviewed the chart and agree with the documentation and plan. I have also made any necessary editorial changes.  Catalina Antigua, MD 01/21/2021 3:30 PM

## 2021-01-26 ENCOUNTER — Other Ambulatory Visit: Payer: Self-pay

## 2021-01-26 ENCOUNTER — Inpatient Hospital Stay (HOSPITAL_COMMUNITY): Payer: Self-pay

## 2021-01-26 ENCOUNTER — Encounter (HOSPITAL_COMMUNITY): Payer: Self-pay | Admitting: Obstetrics and Gynecology

## 2021-01-26 ENCOUNTER — Inpatient Hospital Stay (HOSPITAL_COMMUNITY)
Admission: AD | Admit: 2021-01-26 | Discharge: 2021-01-26 | Disposition: A | Discharge status: Home / Self Care | Payer: Self-pay | Attending: Obstetrics and Gynecology | Admitting: Obstetrics and Gynecology

## 2021-01-26 DIAGNOSIS — O3680X0 Pregnancy with inconclusive fetal viability, not applicable or unspecified: Secondary | ICD-10-CM | POA: Insufficient documentation

## 2021-01-26 DIAGNOSIS — Z3A01 Less than 8 weeks gestation of pregnancy: Secondary | ICD-10-CM | POA: Insufficient documentation

## 2021-01-26 DIAGNOSIS — O26891 Other specified pregnancy related conditions, first trimester: Secondary | ICD-10-CM | POA: Insufficient documentation

## 2021-01-26 DIAGNOSIS — O26899 Other specified pregnancy related conditions, unspecified trimester: Secondary | ICD-10-CM

## 2021-01-26 DIAGNOSIS — O208 Other hemorrhage in early pregnancy: Secondary | ICD-10-CM | POA: Insufficient documentation

## 2021-01-26 DIAGNOSIS — R1010 Upper abdominal pain, unspecified: Secondary | ICD-10-CM | POA: Insufficient documentation

## 2021-01-26 LAB — URINALYSIS, ROUTINE W REFLEX MICROSCOPIC
Bilirubin Urine: NEGATIVE
Glucose, UA: NEGATIVE mg/dL
Hgb urine dipstick: NEGATIVE
Ketones, ur: NEGATIVE mg/dL
Leukocytes,Ua: NEGATIVE
Nitrite: NEGATIVE
Protein, ur: NEGATIVE mg/dL
Specific Gravity, Urine: 1.018 (ref 1.005–1.030)
pH: 6 (ref 5.0–8.0)

## 2021-01-26 LAB — HCG, QUANTITATIVE, PREGNANCY: hCG, Beta Chain, Quant, S: 44536 m[IU]/mL — ABNORMAL HIGH (ref <5)

## 2021-01-26 MED ORDER — PANTOPRAZOLE SODIUM 40 MG IV SOLR
40.0000 mg | Freq: Once | INTRAVENOUS | Status: AC
  Administered 2021-01-26: 40 mg via INTRAVENOUS
  Filled 2021-01-26: qty 40

## 2021-01-26 NOTE — MAU Note (Signed)
Presents with c/o upper abdominal pain that began 3 weeks ago, pain has worsened.  Reports nausea, but hasn't vomited.  Reports having regular BM's, sometimes twice per day, last BM today.  Also has had a H/A x3 weeks, taking Tylenol.  Reports H/A relieved with Tylenol, but returns in approx 4 hours.

## 2021-01-26 NOTE — MAU Provider Note (Signed)
History     CSN: [id number]  Arrival date and time: 01/26/21 1228   Event Date/Time   First Provider Initiated Contact with Patient 01/26/21 1403      Chief Complaint  Patient presents with   Abdominal Pain    Meghan Mclean is a 45 y.o. female  [id number] @ [redacted]w[redacted]d with PMH of GERD, cholelithiasis, ectopic pregnancy. She presents with 3 weeks of upper abdominal pain that has been increasing in severity. She describes the pain as pressure and feels it in her epigastric region. Sometimes she feels it in her lower abdomen when pressing on her epigastric, and she feels it radiate to her ribs. She has tried tylenol with minimal relief. The pain improves when she rests her hands on her abdomen. She states the pain is worse when she's laying flat and after eating. She has not felt anything like this before and states it feels different from her other pregnancies. She denies any abdominal surgeries, but did have an ulcer ablation via colonoscopy 2-3 years ago.  She has some nausea, but denies vomiting, diarrhea, fever, blood in stool, vaginal or urinary sxs.  Around the same time the abdominal pain began, she developed a headache that she describes as pressure. The tylenol provides minimal relief for this as well. Denies dizziness, weakness, vision changes.  OB History     Gravida  10   Para  8   Term  7   Preterm  1   AB  1   Living  8      SAB  0   IAB  0   Ectopic  1   Multiple  0   Live Births  7           Past Medical History:  Diagnosis Date   Asthma 2007   Cholelithiasis    History of anemia    History of ectopic pregnancy 2007   History of pica 2010   Hx of carpal tunnel syndrome 2012   Recurrent boils 2010   Sinusitis    Varicosities     Past Surgical History:  Procedure Laterality Date   NO PAST SURGERIES      Family History  Problem Relation Age of Onset   Asthma Daughter     Social History   Tobacco Use   Smoking status: Never    Smokeless tobacco: Never  Vaping Use   Vaping Use: Never used  Substance Use Topics   Alcohol use: No   Drug use: No    Allergies:  Allergies  Allergen Reactions   Other Anaphylaxis    Peanuts cause her face and throat to swell up   Aspirin Other (See Comments)    "asthma attack"   Ibuprofen     REACTION: asthma trigger   Prednisone Nausea Only and Rash    Medications Prior to Admission  Medication Sig Dispense Refill Last Dose   acetaminophen (TYLENOL) 325 MG tablet Take 325-650 mg by mouth as needed.      albuterol (PROVENTIL HFA;VENTOLIN HFA) 108 (90 Base) MCG/ACT inhaler Inhale into the lungs every 6 (six) hours as needed for wheezing or shortness of breath.      fluticasone-salmeterol (ADVAIR HFA) 115-21 MCG/ACT inhaler Inhale 2 puffs into the lungs 2 (two) times daily.      omeprazole (PRILOSEC) 40 MG capsule Take 1 capsule (40 mg total) by mouth daily. (Patient not taking: Reported on 01/21/2021) 30 capsule 1    promethazine (PHENERGAN) 25 MG tablet Take  by mouth.       Review of Systems  Constitutional:  Negative for chills and fever.  HENT: Negative.    Eyes: Negative.   Respiratory: Negative.  Negative for shortness of breath.   Cardiovascular: Negative.  Negative for chest pain.  Gastrointestinal:  Positive for abdominal pain and nausea. Negative for blood in stool, constipation, diarrhea and vomiting.  Endocrine: Negative.   Genitourinary: Negative.  Negative for dysuria, pelvic pain, vaginal bleeding, vaginal discharge and vaginal pain.  Musculoskeletal: Negative.   Skin: Negative.   Allergic/Immunologic: Negative.   Neurological:  Positive for headaches. Negative for weakness.  Hematological: Negative.   Psychiatric/Behavioral: Negative.     Physical Exam   Blood pressure 103/60, pulse 72, temperature 98 F (36.7 C), temperature source Oral, resp. rate 20, weight 83.6 kg, SpO2 100 %.  Physical Exam Constitutional:      Appearance: She is well-developed.  She is obese.  HENT:     Head: Normocephalic and atraumatic.  Pulmonary:     Effort: Pulmonary effort is normal.  Abdominal:     General: Abdomen is protuberant.     Palpations: Abdomen is soft.     Tenderness: There is abdominal tenderness in the epigastric area and periumbilical area. There is no guarding. Negative signs include Murphy's sign.     Hernia: No hernia is present.  Genitourinary:    Comments: deferred Skin:    General: Skin is warm and dry.  Neurological:     Mental Status: She is alert.  Psychiatric:        Mood and Affect: Mood normal.        Behavior: Behavior normal.    MAU Course  Procedures  MDM  Assessment and Plan    PA attestation:  I have seen and examined this patient and agree with above documentation in the PA's note.   Meghan Mclean is a 45 y.o. [id number] female reporting upper abdominal pain. She has a history of GERD and colicstitis. She has been followed by GI who has been treating her GERD. She reports with upper abdominal pain x 3 weeks. She has no vomiting. She has tried pepcid which is not helping. The pain radiates around to both ribs. The pain comes and goes. The pain improves when she puts pressure on the upper part of her abdomen.   Associated symptoms:  Negative fever and chills positive abdominal pain Negative vaginal bleeding Negative vaginal discharge Negative urinary complaints Positive GI complaints  PE: No data found. Gen: calm comfortable, NAD Resp: normal effort, no distress Heart: Regular rate Abd: Soft, NT, Pos BS x 4, bilateral upper abdominal pain. + epigastric pain Neuro: A&O x 4 ROS, labs, PMH reviewed  Orders Placed This Encounter  Procedures   US OB LESS THAN 14 WEEKS WITH OB TRANSVAGINAL   US Abdomen Limited RUQ (LIVER/GB)   [place] MFM OB Comp Less 14 Wks   hCG, quantitative, pregnancy   Urinalysis, Routine w reflex microscopic Urine, Clean Catch   Discharge patient   Meds ordered this  encounter  Medications   pantoprazole (PROTONIX) injection 40 mg    MDM Wet prep & GC HIV, CBC, Hcg, ABO US OB transvaginal   Dr. Adrian Blackwater at bedside to see patient and assess her.   Recent Results (from the past 2160 hour(s))  Pregnancy, urine POC     Status: Abnormal   Collection Time: 01/21/21 11:09 AM  Result Value Ref Range   Preg Test, Ur POSITIVE (A) NEGATIVE  Comment:        THE SENSITIVITY OF THIS METHODOLOGY IS >24 mIU/mL   hCG, quantitative, pregnancy     Status: Abnormal   Collection Time: 01/26/21  1:42 PM  Result Value Ref Range   hCG, Beta Chain, Quant, S 44,536 (H) <5 mIU/mL    Comment:          GEST. AGE      CONC.  (mIU/mL)   <=1 WEEK        5 - 50     2 WEEKS       50 - 500     3 WEEKS       100 - 10,000     4 WEEKS     1,000 - 30,000     5 WEEKS     3,500 - 115,000   6-8 WEEKS     12,000 - 270,000    12 WEEKS     15,000 - 220,000        FEMALE AND NON-PREGNANT FEMALE:     LESS THAN 5 mIU/mL Performed at [facility], [address], [place], [place] [address]   H Pylori, IGM, IGG, IGA AB     Status: Abnormal   Collection Time: 01/26/21  4:50 PM  Result Value Ref Range   H. Pylogi, Iga Abs 23.3 (H) 0.0 - 8.9 units    Comment: (NOTE)                                Negative          <9.0                                Equivocal   9.0 - 11.0                                Positive         >11.0    H. Pylogi, Igm Abs <9.0 0.0 - 8.9 units    Comment: (NOTE)                                Negative          <9.0                                Equivocal   9.0 - 11.0                                Positive         >11.0 This test was developed and its performance characteristics determined by [facility]. It has not been cleared or approved by the Food and Drug Administration. Performed At: [facility] [address] [place], [place] [id number] Jolene Schimke MD PX:[id number]    H Pylori IgG 0.30 0.00 - 0.79 Index Value    Comment:  (NOTE)                             Negative           <0.80  Equivocal    0.80 - 0.89                             Positive           >0.89   Urinalysis, Routine w reflex microscopic Urine, Clean Catch     Status: None   Collection Time: 01/26/21  6:48 PM  Result Value Ref Range   Color, Urine YELLOW YELLOW   APPearance CLEAR CLEAR   Specific Gravity, Urine 1.018 1.005 - 1.030   pH 6.0 5.0 - 8.0   Glucose, UA NEGATIVE NEGATIVE mg/dL   Hgb urine dipstick NEGATIVE NEGATIVE   Bilirubin Urine NEGATIVE NEGATIVE   Ketones, ur NEGATIVE NEGATIVE mg/dL   Protein, ur NEGATIVE NEGATIVE mg/dL   Nitrite NEGATIVE NEGATIVE   Leukocytes,Ua NEGATIVE NEGATIVE    Comment: Performed at [facility], [address] [place], [place] [id number]      Assessment 1. Pregnancy with uncertain fetal viability, single or unspecified fetus   2. Abdominal pain affecting pregnancy   3. Upper abdominal pain     Plan: - Discharge home in stable condition - Strict return precautions. - Follow-up as scheduled at your doctor's office or sooner as needed if symptoms worsen. - Return to maternity admissions symptoms worsen - F/u US for viability - interpretor used - H-pylori collected and pending.  - f/u with GI   Omere Marti, Harolyn RutherfordJennifer I, NP 02/03/2021 8:48 PM

## 2021-01-27 LAB — H PYLORI, IGM, IGG, IGA AB
H Pylori IgG: 0.3 Index Value (ref 0.00–0.79)
H. Pylogi, Iga Abs: 23.3 units — ABNORMAL HIGH (ref 0.0–8.9)
H. Pylogi, Igm Abs: <9 units (ref 0.0–8.9)

## 2021-01-28 ENCOUNTER — Other Ambulatory Visit: Payer: Self-pay

## 2021-01-28 ENCOUNTER — Telehealth: Payer: Self-pay

## 2021-01-28 DIAGNOSIS — O3680X0 Pregnancy with inconclusive fetal viability, not applicable or unspecified: Secondary | ICD-10-CM

## 2021-01-28 DIAGNOSIS — K30 Functional dyspepsia: Secondary | ICD-10-CM

## 2021-01-28 MED ORDER — PANTOPRAZOLE SODIUM 20 MG PO TBEC: 20.0000 mg | DELAYED_RELEASE_TABLET | Freq: Every day | ORAL | 0 refills | Status: DC

## 2021-01-28 NOTE — Telephone Encounter (Signed)
Called Pt using [place] interpreter [place] id# [phone] to advise that we are not able to do intake until we can verify viability per U/S recommendation on 01/26/21,advised f/u US scheduled for 02/08/21 @ 2pm. Pt also states having pain in abdomen with painful bowel movements with mucous. Advised will send Rx for Protonix & take Tylenol. Also advised if continues to have pain to go back to ED. Pt verbalized understanding.

## 2021-02-03 ENCOUNTER — Other Ambulatory Visit: Payer: Self-pay | Admitting: Obstetrics and Gynecology

## 2021-02-03 MED ORDER — AMOXICILLIN 500 MG PO CAPS: 1000.0000 mg | ORAL_CAPSULE | Freq: Two times a day (BID) | ORAL | 0 refills | Status: DC

## 2021-02-03 MED ORDER — PANTOPRAZOLE SODIUM 40 MG PO TBEC: 40.0000 mg | DELAYED_RELEASE_TABLET | Freq: Two times a day (BID) | ORAL | 1 refills | Status: DC

## 2021-02-03 MED ORDER — CLARITHROMYCIN 500 MG PO TABS: 500.0000 mg | ORAL_TABLET | Freq: Two times a day (BID) | ORAL | 0 refills | Status: DC

## 2021-02-03 NOTE — Progress Notes (Signed)
+   H pylori.   Treatment sent into pharmacy. Used Teacher, English as a foreign language and discussed over the phone.  Rx: Clarithromycin, Amoxicillin, and Protonix.   Questions answered.   Venia Carbon, NP

## 2021-02-04 ENCOUNTER — Telehealth: Payer: Self-pay

## 2021-02-04 NOTE — Telephone Encounter (Signed)
Call received from after hours nurse answering service. RN wanted to clarify antibiotic administration instructions. Reviewed medications with nurse, who agrees to relay information to patient.

## 2021-02-08 ENCOUNTER — Ambulatory Visit (INDEPENDENT_AMBULATORY_CARE_PROVIDER_SITE_OTHER): Payer: Self-pay | Admitting: Family Medicine

## 2021-02-08 ENCOUNTER — Other Ambulatory Visit: Payer: Self-pay

## 2021-02-08 ENCOUNTER — Ambulatory Visit
Admission: RE | Admit: 2021-02-08 | Discharge: 2021-02-08 | Disposition: A | Discharge status: Home / Self Care | Payer: Self-pay | Source: Ambulatory Visit | Attending: Student | Admitting: Student

## 2021-02-08 ENCOUNTER — Encounter: Payer: Self-pay | Admitting: Family Medicine

## 2021-02-08 VITALS — Ht 59.0 in | Wt 184.2 lb

## 2021-02-08 DIAGNOSIS — O021 Missed abortion: Secondary | ICD-10-CM

## 2021-02-08 DIAGNOSIS — O3680X0 Pregnancy with inconclusive fetal viability, not applicable or unspecified: Secondary | ICD-10-CM | POA: Insufficient documentation

## 2021-02-08 NOTE — Progress Notes (Signed)
[place] scheduled for 02/16/21 @ 2 PM; pt to arrive at 1:45 PM.   Fleet Contras RN 02/08/21

## 2021-02-08 NOTE — Progress Notes (Signed)
Ultrasounds Results Note Spanish interpreter: Raquel used  SUBJECTIVE HPI:  Meghan Mclean is a 45 y.o. [id number] at [redacted]w[redacted]d by LMP who presents to the [facility] for followup ultrasound results. The patient denies abdominal pain. She has had some mild spotting with wiping.  Upon review of the patient's records, patient was first seen in MAU on 01/26/2021 for upper abdominal pain.   BHCG on that day was 44,536.  Ultrasound showed SIUP with CRL no yolk sac and no FHR.  Repeat ultrasound was performed earlier today.   Past Medical History:  Diagnosis Date   Asthma 2007   Cholelithiasis    History of anemia    History of ectopic pregnancy 2007   History of pica 2010   Hx of carpal tunnel syndrome 2012   Recurrent boils 2010   Sinusitis    Varicosities    Past Surgical History:  Procedure Laterality Date   NO PAST SURGERIES     Social History   Socioeconomic History   Marital status: Married    Spouse name: Not on file   Number of children: 8   Years of education: Not on file   Highest education level: Not on file  Occupational History   Not on file  Tobacco Use   Smoking status: Never   Smokeless tobacco: Never  Vaping Use   Vaping Use: Never used  Substance and Sexual Activity   Alcohol use: No   Drug use: No   Sexual activity: Not Currently  Other Topics Concern   Not on file  Social History Narrative   Not on file   Social Determinants of Health   Financial Resource Strain: Not on file  Food Insecurity: Not on file  Transportation Needs: Not on file  Physical Activity: Not on file  Stress: Not on file  Social Connections: Not on file  Intimate Partner Violence: Not on file   Current Outpatient Medications on File Prior to Visit  Medication Sig Dispense Refill   acetaminophen (TYLENOL) 325 MG tablet Take 325-650 mg by mouth as needed.     albuterol (PROVENTIL HFA;VENTOLIN HFA) 108 (90 Base) MCG/ACT inhaler Inhale into the lungs every  6 (six) hours as needed for wheezing or shortness of breath.     amoxicillin (AMOXIL) 500 MG capsule Take 2 capsules (1,000 mg total) by mouth 2 (two) times daily. 56 capsule 0   clarithromycin (BIAXIN) 500 MG tablet Take 1 tablet (500 mg total) by mouth 2 (two) times daily. 28 tablet 0   fluticasone-salmeterol (ADVAIR HFA) 115-21 MCG/ACT inhaler Inhale 2 puffs into the lungs 2 (two) times daily.     pantoprazole (PROTONIX) 40 MG tablet Take 1 tablet (40 mg total) by mouth 2 (two) times daily. 90 tablet 1   promethazine (PHENERGAN) 25 MG tablet Take by mouth.     omeprazole (PRILOSEC) 40 MG capsule Take 1 capsule (40 mg total) by mouth daily. (Patient not taking: Reported on 02/08/2021) 30 capsule 1   No current facility-administered medications on file prior to visit.   Allergies  Allergen Reactions   Other Anaphylaxis    Peanuts cause her face and throat to swell up   Aspirin Other (See Comments)    "asthma attack"   Ibuprofen     REACTION: asthma trigger   Prednisone Nausea Only and Rash    I have reviewed patient's Past Medical Hx, Surgical Hx, Family Hx, Social Hx, medications and allergies.   Review of Systems Review of Systems  Constitutional: Negative for fever and chills.  Gastrointestinal: Negative for nausea, vomiting, abdominal pain, diarrhea and constipation.  Genitourinary: Negative for dysuria.  Musculoskeletal: Negative for back pain.  Neurological: Negative for dizziness and weakness.    Physical Exam  Ht 4\' 11"  (1.499 m)   Wt 184 lb 3.2 oz (83.6 kg)   LMP  (LMP Unknown)   Breastfeeding No   BMI 37.20 kg/m   GENERAL: Well-developed, well-nourished female in no acute distress.  HEENT: Normocephalic, atraumatic.   LUNGS: Effort normal ABDOMEN: soft, non-tender HEART: Regular rate  SKIN: Warm, dry and without erythema PSYCH: Normal mood and affect NEURO: Alert and oriented x 4  LAB RESULTS No results found for this or any previous visit (from the past 24  hour(s)).  IMAGING OB Transvaginal  Result Date: 02/08/2021 CLINICAL DATA:  Check viability. EXAM: OBSTETRIC <14 WK ULTRASOUND TECHNIQUE: Transvaginal ultrasound was performed for evaluation of the gestation as well as the maternal uterus and adnexal regions. COMPARISON:  01/26/2021. FINDINGS: Intrauterine gestational sac: Yes. Yolk sac:  No. Embryo:  Questionable. Cardiac Activity: None. CRL:   3.1 mm   5 w 6 d                  03/28/2021 EDC: Subchorionic hemorrhage:  Small. Maternal uterus/adnexae: Ovaries are visualized.  Trace free fluid. IMPRESSION: 1. Large gestational sac with questionable tiny embryo and no cardiac activity, findings highly suspicious for a nonviable intrauterine pregnancy. However, according to SRU criteria, study is characterized as suspicious but not yet definitive for failed pregnancy. Recommend follow-up US in 10-14 days for definitive diagnosis. This recommendation follows SRU consensus guidelines: Diagnostic Criteria for Nonviable Pregnancy Early in the First Trimester. 02-15-1979 Med 20132014. 2. Small subchorionic hemorrhage. Electronically Signed   By: ; [id number] M.D.   On: 02/08/2021 15:03   02/10/2021 OB LESS THAN 14 WEEKS WITH OB TRANSVAGINAL  Result Date: 01/26/2021 CLINICAL DATA:  Abdominal pain EXAM: OBSTETRIC <14 WK 03/28/2021 AND TRANSVAGINAL OB US TECHNIQUE: Both transabdominal and transvaginal ultrasound examinations were performed for complete evaluation of the gestation as well as the maternal uterus, adnexal regions, and pelvic cul-de-sac. Transvaginal technique was performed to assess early pregnancy. COMPARISON:  None. FINDINGS: Intrauterine gestational sac: Single Yolk sac:  Not Visualized. Embryo:  Visualized. Cardiac Activity: Not Visualized. Heart Rate: N/a  bpm MSD: 28 mm   7 w   6 d CRL:  4 mm   6 w   0 d                  [place] EDC: 09/21/2021 Subchorionic hemorrhage:  Small Maternal uterus/adnexae: The ovaries are normal bilaterally. IMPRESSION: 1. Single  intrauterine pregnancy identified with crown-rump length yielding estimated gestational age of [redacted] weeks 0 days. No cardiac activity identified. 2. Small subchorionic hemorrhage. 3. Findings are suspicious but not yet definitive for failed pregnancy. Recommend follow-up 11/21/2021 in 10-14 days for definitive diagnosis. This recommendation follows SRU consensus guidelines: Diagnostic Criteria for Nonviable Pregnancy Early in the First Trimester. 02[birth year]15-1979 Med 20132014. Electronically Signed   By: ; [id number] M.D.   On: 01/26/2021 15:26   03/28/2021 Abdomen Limited RUQ (LIVER/GB)  Result Date: 01/26/2021 CLINICAL DATA:  Upper abdominal pain.  Nausea and vomiting. EXAM: ULTRASOUND ABDOMEN LIMITED RIGHT UPPER QUADRANT COMPARISON:  None. FINDINGS: Gallbladder: No gallstones or wall thickening visualized. Sonographic Murphy's sign reported by sonographer. Common bile duct: Diameter: 2.7 mm Liver: No focal lesion identified. Within normal  limits in parenchymal echogenicity. Portal vein is patent on color Doppler imaging with normal direction of blood flow towards the liver. Other: None. IMPRESSION: 1. Normal appearance of the gallbladder. No gallstones, gallbladder wall thickening or pericholecystic fluid. 2. Positive sonographic Murphy's sign reported by sonographer. Electronically Signed   By: Signa Kell M.D.   On: 01/26/2021 18:39    ASSESSMENT/PLAN: Problem List Items Addressed This Visit       Unprioritized   Missed abortion - Primary    Discussed last u/s showed 4 mm CRL and no FHR--now down to 3.1 mm no heart rate 13 days later. She has had spotting. She was offered surgical or medical evacuation and expectant management. She states her last baby, she was told something similar. She was offered and accepted f/u u/s in 10 days.      Relevant Orders   US OB LESS THAN 14 WEEKS WITH OB TRANSVAGINAL   Blood type is O pos Discharge home in stable condition    Reva Bores, MD  02/08/2021  3:45  PM

## 2021-02-08 NOTE — Assessment & Plan Note (Signed)
Discussed last u/s showed 4 mm CRL and no FHR--now down to 3.1 mm no heart rate 13 days later. She has had spotting. She was offered surgical or medical evacuation and expectant management. She states her last baby, she was told something similar. She was offered and accepted f/u u/s in 10 days.

## 2021-02-10 ENCOUNTER — Telehealth: Payer: Self-pay

## 2021-02-10 NOTE — Telephone Encounter (Signed)
Patient called front office and was transferred to clinical staff. Reports abdominal pain and pressure  Per chart review, pt had confirmed failed pregnancy on 02/08/21. Was offered expectant management vs Cytotec vs surgical intervention at that time. Pt reports pain and pressure since time of appt. Rates abdominal pain 7/10 at this time. Reports taking Tylenol 325 mg for the pain. Instructed pt she may take 3 of these tablets every 6 hours. Instructed pt to report to MAU for any increased abdominal pain, heavy vaginal bleeding, or fever like symptoms overnight. Pt may contact the office with any further concerns during business hours. Reviewed options with patient who states she would like to continue expectant management until ultrasound on 02/16/21. Telephone encounter completed with interpreter Eda.

## 2021-02-16 ENCOUNTER — Other Ambulatory Visit: Payer: Self-pay | Admitting: Family Medicine

## 2021-02-16 ENCOUNTER — Encounter: Payer: Self-pay | Admitting: Nurse Practitioner

## 2021-02-16 ENCOUNTER — Other Ambulatory Visit: Payer: Self-pay | Admitting: Obstetrics and Gynecology

## 2021-02-16 ENCOUNTER — Ambulatory Visit (INDEPENDENT_AMBULATORY_CARE_PROVIDER_SITE_OTHER): Payer: Self-pay | Admitting: Nurse Practitioner

## 2021-02-16 ENCOUNTER — Other Ambulatory Visit: Payer: Self-pay

## 2021-02-16 ENCOUNTER — Ambulatory Visit
Admission: RE | Admit: 2021-02-16 | Discharge: 2021-02-16 | Disposition: A | Discharge status: Home / Self Care | Payer: Self-pay | Source: Ambulatory Visit | Attending: Family Medicine | Admitting: Family Medicine

## 2021-02-16 VITALS — BP 114/63 | HR 86 | Wt 183.8 lb

## 2021-02-16 DIAGNOSIS — O021 Missed abortion: Secondary | ICD-10-CM

## 2021-02-16 MED ORDER — MISOPROSTOL 200 MCG PO TABS: 800.0000 ug | ORAL_TABLET | Freq: Four times a day (QID) | ORAL | 0 refills | Status: DC

## 2021-02-16 MED ORDER — MISOPROSTOL 200 MCG PO TABS: 800.0000 ug | ORAL_TABLET | Freq: Once | ORAL | 0 refills | Status: AC

## 2021-02-16 NOTE — Progress Notes (Signed)
GYNECOLOGY OFFICE VISIT NOTE   History:  45 y.o. [id number] here today for follow up after ultrasound.  Ultrasound done today and confirmed the suspected failed pregnancy.  No fetal pole.  No yolk sac. She denies any abnormal vaginal discharge, bleeding, pelvic pain or other concerns.   Has had some cramping but no bleeding. Interpreter present for the entire visit.  Husband accompanies patient.  Past Medical History:  Diagnosis Date   Asthma 2007   Cholelithiasis    History of anemia    History of ectopic pregnancy 2007   History of pica 2010   Hx of carpal tunnel syndrome 2012   Recurrent boils 2010   Sinusitis    Varicosities     Past Surgical History:  Procedure Laterality Date   NO PAST SURGERIES      The following portions of the patient's history were reviewed and updated as appropriate: allergies, current medications, past family history, past medical history, past social history, past surgical history and problem list.    Review of Systems:  Pertinent items noted in HPI and remainder of comprehensive ROS otherwise negative.  Objective:  Physical Exam BP 114/63   Pulse 86   Wt 183 lb 12.8 oz (83.4 kg)   LMP  (LMP Unknown)   BMI 37.12 kg/m  CONSTITUTIONAL: Well-developed, well-nourished female in no acute distress.  HENT:  Normocephalic, atraumatic. External right and left ear normal.  EYES: Conjunctivae and EOM are normal. Pupils are equal, round.  No scleral icterus.  NECK: Normal range of motion, supple, no masses SKIN: Skin is warm and dry. No rash noted. Not diaphoretic. No erythema. No pallor. NEUROLOGIC: Alert and oriented to person, place, and time. Normal muscle tone coordination. No cranial nerve deficit noted. PSYCHIATRIC: Normal mood and affect. Normal behavior. Normal judgment and thought content. CARDIOVASCULAR: Normal heart rate noted RESPIRATORY: Effort and breath sounds normal, no problems with respiration noted ABDOMEN: Soft, no distention  noted.   PELVIC: Deferred MUSCULOSKELETAL: Normal range of motion. No edema noted.  Labs and Imaging US OB Transvaginal  Result Date: 02/16/2021 CLINICAL DATA:  Pregnancy of unknown viability, missed abortion EXAM: TRANSVAGINAL OB ULTRASOUND TECHNIQUE: Transvaginal ultrasound was performed for complete evaluation of the gestation as well as the maternal uterus, adnexal regions, and pelvic cul-de-sac. COMPARISON:  02/08/2021 FINDINGS: Intrauterine gestational sac: Present, single, irregular Yolk sac:  Absent Embryo:  Absent Cardiac Activity: N/A Heart Rate: N/A bpm MSD: 29.0 mm   8 w   0 d Subchorionic hemorrhage:  Small subchronic hemorrhage Maternal uterus/adnexae: Maternal uterus otherwise unremarkable. RIGHT ovary not visualized. LEFT ovary normal size and morphology, 3.4 x 2.4 x 1.8 cm. No free pelvic fluid or adnexal masses. IMPRESSION: Single irregular gestational sac within the uterus, lacking a yolk sac and fetal pole. In combination with mean sac diameter of 29.0 mm, findings now meet definitive criteria for failed pregnancy. This follows SRU consensus guidelines: Diagnostic Criteria for Nonviable Pregnancy Early in the First Trimester. Macy Mis J Med [phone]. Electronically Signed   By: Ulyses Southward M.D.   On: 02/16/2021 14:52   US OB Transvaginal  Result Date: 02/08/2021 CLINICAL DATA:  Check viability. EXAM: OBSTETRIC <14 WK ULTRASOUND TECHNIQUE: Transvaginal ultrasound was performed for evaluation of the gestation as well as the maternal uterus and adnexal regions. COMPARISON:  01/26/2021. FINDINGS: Intrauterine gestational sac: Yes. Yolk sac:  No. Embryo:  Questionable. Cardiac Activity: None. CRL:   3.1 mm   5 w 6 d  [place] EDC: Subchorionic hemorrhage:  Small. Maternal uterus/adnexae: Ovaries are visualized.  Trace free fluid. IMPRESSION: 1. Large gestational sac with questionable tiny embryo and no cardiac activity, findings highly suspicious for a nonviable intrauterine  pregnancy. However, according to SRU criteria, study is characterized as suspicious but not yet definitive for failed pregnancy. Recommend follow-up US in 10-14 days for definitive diagnosis. This recommendation follows SRU consensus guidelines: Diagnostic Criteria for Nonviable Pregnancy Early in the First Trimester. Malva Limes Med 2013; 557:3220-25. 2. Small subchorionic hemorrhage. Electronically Signed   By: Leanna Battles M.D.   On: 02/08/2021 15:03   US OB LESS THAN 14 WEEKS WITH OB TRANSVAGINAL  Result Date: 01/26/2021 CLINICAL DATA:  Abdominal pain EXAM: OBSTETRIC <14 WK [place] AND TRANSVAGINAL OB US TECHNIQUE: Both transabdominal and transvaginal ultrasound examinations were performed for complete evaluation of the gestation as well as the maternal uterus, adnexal regions, and pelvic cul-de-sac. Transvaginal technique was performed to assess early pregnancy. COMPARISON:  None. FINDINGS: Intrauterine gestational sac: Single Yolk sac:  Not Visualized. Embryo:  Visualized. Cardiac Activity: Not Visualized. Heart Rate: N/a  bpm MSD: 28 mm   7 w   6 d CRL:  4 mm   6 w   0 d                  [place] EDC: 09/21/2021 Subchorionic hemorrhage:  Small Maternal uterus/adnexae: The ovaries are normal bilaterally. IMPRESSION: 1. Single intrauterine pregnancy identified with crown-rump length yielding estimated gestational age of [redacted] weeks 0 days. No cardiac activity identified. 2. Small subchorionic hemorrhage. 3. Findings are suspicious but not yet definitive for failed pregnancy. Recommend follow-up US in 10-14 days for definitive diagnosis. This recommendation follows SRU consensus guidelines: Diagnostic Criteria for Nonviable Pregnancy Early in the First Trimester. Malva Limes Med 2013; 427:0623-76. Electronically Signed   By: Lesia Hausen M.D.   On: 01/26/2021 15:26   US Abdomen Limited RUQ (LIVER/GB)  Result Date: 01/26/2021 CLINICAL DATA:  Upper abdominal pain.  Nausea and vomiting. EXAM: ULTRASOUND ABDOMEN LIMITED RIGHT  UPPER QUADRANT COMPARISON:  None. FINDINGS: Gallbladder: No gallstones or wall thickening visualized. Sonographic Murphy's sign reported by sonographer. Common bile duct: Diameter: 2.7 mm Liver: No focal lesion identified. Within normal limits in parenchymal echogenicity. Portal vein is patent on color Doppler imaging with normal direction of blood flow towards the liver. Other: None. IMPRESSION: 1. Normal appearance of the gallbladder. No gallstones, gallbladder wall thickening or pericholecystic fluid. 2. Positive sonographic Murphy's sign reported by sonographer. Electronically Signed   By: Signa Kell M.D.   On: 01/26/2021 18:39    Assessment & Plan:  Missed Abortion  Discussed ultrasound findings with patient for failed pregnancy.   Reviewed possible plans - expectant management and cytotec.  Patient decided to try cytotec - advised to use vaginally with tylenol.  Prescription sent to her pharmacy. Will need quant at following visit. Blood type O positive  Routine preventative health maintenance measures emphasized. Please refer to After Visit Summary for other counseling recommendations.   Return in about 3 weeks (around 03/09/2021) for in person visit for follow up of miscarriage.   Total face-to-face time with patient:  10  minutes.  Over 50% of encounter was spent on counseling and coordination of care.  Nolene Bernheim, RN, MSN, NP-BC Nurse Practitioner, [facility] for [employer], [facility] 02/16/2021 8:53 PM

## 2021-02-16 NOTE — Progress Notes (Signed)
Patient reports pains "similar to contractions" that started 2 nights ago. Patient denies any vaginal bleeding but stated "some pressure"

## 2021-02-23 ENCOUNTER — Telehealth: Payer: Self-pay | Admitting: General Practice

## 2021-02-23 ENCOUNTER — Other Ambulatory Visit: Payer: Self-pay | Admitting: Advanced Practice Midwife

## 2021-02-23 DIAGNOSIS — O021 Missed abortion: Secondary | ICD-10-CM

## 2021-02-23 DIAGNOSIS — O039 Complete or unspecified spontaneous abortion without complication: Secondary | ICD-10-CM

## 2021-02-23 MED ORDER — TRAMADOL HCL 50 MG PO TABS: 50.0000 mg | ORAL_TABLET | Freq: Four times a day (QID) | ORAL | 0 refills | Status: DC | PRN

## 2021-02-23 NOTE — Addendum Note (Signed)
Addended by: Kathee Delton on: 02/23/2021 04:14 PM   Modules accepted: Orders

## 2021-02-23 NOTE — Telephone Encounter (Signed)
Patient called into front office stating she is still bleeding from a miscarriage and is having cramping like contractions. Patient states she was seen in the ER on Friday. She reports taking tylenol 500mg  every 6 hours and is afraid to take more because of her asthma- currently rates pain at a 8 and bleeding is like a normal period. Notes reviewed via care everywhere in epic from [facility] ER note. Discussed with [facility] who states patient can have tramadol for pain, should have a bhcg this week, & follow up provider visit. Per chart review, patient currently has follow up visit scheduled for 10/24. Discussed with patient and reviewed medication instructions. Scheduled lab appt for 10/6 @ 10am. Patient verbalized understanding to all.

## 2021-02-25 ENCOUNTER — Other Ambulatory Visit: Payer: Self-pay

## 2021-02-25 DIAGNOSIS — O039 Complete or unspecified spontaneous abortion without complication: Secondary | ICD-10-CM

## 2021-02-26 LAB — BETA HCG QUANT (REF LAB): hCG Quant: 54 m[IU]/mL

## 2021-03-03 ENCOUNTER — Telehealth: Payer: Self-pay

## 2021-03-03 NOTE — Telephone Encounter (Signed)
-----   Message from Hurshel Party, CNM sent at 03/02/2021  4:35 PM EDT ----- Pt has had appropriate drop in hcg for miscarriage but will need lab weekly until less than 5. Last hcg was on 10/6 and was 54. Please call to let her know and schedule lab later this week.  She needs spanish interpreter. Thank you.

## 2021-03-03 NOTE — Telephone Encounter (Signed)
Pt has appt with Dr Debroah Loop for follow up on 10/24. Pt aware.  Judeth Cornfield, RN

## 2021-03-09 ENCOUNTER — Telehealth: Payer: Self-pay | Admitting: Family Medicine

## 2021-03-09 NOTE — Telephone Encounter (Signed)
Patient calling to get her test results 

## 2021-03-09 NOTE — Telephone Encounter (Signed)
Call placed to pt with interpreter Meghan Mclean. Pt states wanting to know if last beta on 10/6 was negative. Pt denies any vaginal bleeding or pain since SAB.  Pt given results as below. Pt verbalized understanding. Pt ok to schedule lab only appt for 10/20 to check beta. Pt agreeable to date and time of appt.  Pt then has follow up appt with Dr Debroah Loop on 10/24. Pt aware and verbalized understanding.   Judeth Cornfield, RN    Hurshel Party, CNM  03/02/2021  4:35 PM EDT Back to Top    Pt has had appropriate drop in hcg for miscarriage but will need lab weekly until less than 5. Last hcg was on 10/6 and was 54. Please call to let her know and schedule lab later this week.  She needs spanish interpreter. Thank you

## 2021-03-10 ENCOUNTER — Other Ambulatory Visit: Payer: Self-pay

## 2021-03-10 DIAGNOSIS — O039 Complete or unspecified spontaneous abortion without complication: Secondary | ICD-10-CM

## 2021-03-11 ENCOUNTER — Other Ambulatory Visit: Payer: Self-pay

## 2021-03-11 DIAGNOSIS — O039 Complete or unspecified spontaneous abortion without complication: Secondary | ICD-10-CM

## 2021-03-12 LAB — BETA HCG QUANT (REF LAB): hCG Quant: 2 m[IU]/mL

## 2021-03-15 ENCOUNTER — Ambulatory Visit: Payer: Self-pay | Admitting: Obstetrics & Gynecology

## 2021-04-19 ENCOUNTER — Other Ambulatory Visit: Payer: Self-pay

## 2021-04-19 ENCOUNTER — Ambulatory Visit (INDEPENDENT_AMBULATORY_CARE_PROVIDER_SITE_OTHER): Payer: Self-pay | Admitting: Obstetrics & Gynecology

## 2021-04-19 ENCOUNTER — Encounter: Payer: Self-pay | Admitting: Obstetrics & Gynecology

## 2021-04-19 VITALS — BP 119/73 | HR 68 | Wt 176.3 lb

## 2021-04-19 DIAGNOSIS — Z3040 Encounter for surveillance of contraceptives, unspecified: Secondary | ICD-10-CM

## 2021-04-19 NOTE — Progress Notes (Signed)
Patient in for SAB follow up. States she had her cycle on 04/14/21 and ended today. Has had no other issues or concerns. Would like to discuss birth control options specifically Nexplanon.   Wynona Canes, CMA

## 2021-04-19 NOTE — Progress Notes (Signed)
Patient ID: Meghan Mclean, female   DOB: 03/14/1976, 45 y.o.   MRN: [id number] Patient returns after miscarriage in September, first trimester failed pregnancy.  [id number] Patient's last menstrual period was 04/14/2021 (exact date). She has abstained from intercourse and requested Nexplanon but agrees to be referred to [facility] for this as she is self-pay and wants to avoid additional cost. Her questions were answered. 10 minutes Adam Phenix, MD 04/19/2021

## 2021-05-04 ENCOUNTER — Telehealth: Payer: Self-pay | Admitting: Nurse Practitioner

## 2021-05-04 MED ORDER — OMEPRAZOLE 40 MG PO CPDR
40.0000 mg | DELAYED_RELEASE_CAPSULE | Freq: Every day | ORAL | 1 refills | Status: DC
Start: 2021-05-04 — End: 2022-04-25

## 2021-05-04 NOTE — Telephone Encounter (Signed)
RX sent

## 2021-05-04 NOTE — Telephone Encounter (Signed)
Inbound call from patient's daughter requesting additional refills for omeprazole please.

## 2021-10-11 ENCOUNTER — Encounter: Payer: Self-pay | Admitting: Physician Assistant

## 2021-11-01 ENCOUNTER — Ambulatory Visit (INDEPENDENT_AMBULATORY_CARE_PROVIDER_SITE_OTHER): Payer: Self-pay | Admitting: Physician Assistant

## 2021-11-01 ENCOUNTER — Encounter: Payer: Self-pay | Admitting: Physician Assistant

## 2021-11-01 VITALS — BP 124/80 | HR 72 | Wt 181.0 lb

## 2021-11-01 DIAGNOSIS — R1013 Epigastric pain: Secondary | ICD-10-CM

## 2021-11-01 DIAGNOSIS — K219 Gastro-esophageal reflux disease without esophagitis: Secondary | ICD-10-CM

## 2021-11-01 NOTE — Progress Notes (Signed)
Chief Complaint: Abdominal pain  HPI:    Meghan Mclean is a 46 year old Hispanic female, assigned to Dr. Lavon Paganini, with past medical history as listed below, who was referred to me by List, Nash Shearer, FNP for a complaint of abdominal pain.      03/23/2018 colonoscopy with a few ulcers in the terminal ileum, nonbleeding internal hemorrhoids.  Biopsy showed small bowel mucosa with lymphoid aggregates.    12/03/2020 office visit with Alcide Evener, NP for GERD.  At that time noted she had previously been seen 01/28/2020 with left lower quadrant abdominal pain.  CT of the abdomen and pelvis with contrast 01/29/2020 showed a small hiatal hernia and was otherwise negative/normal.  She was given Dicyclomine.  At that time discussed ongoing reflux.  At that time patient was increased back to Omeprazole 40 mg daily.  Discussed scheduling an EGD if her symptoms persisted or worsened.    Today, patient is seen with the assistance of interpreter, the patient tells me that her PCP recently changed her to Pantoprazole 40 mg twice daily over the past couple of months given that she continued with pain above her bellybutton.  Tells me that this does help some but has not completely relieved her pain.  Tells me at times she just lays down and massages around her bellybutton to make it feel better.  Apparently at some point there was question of abdominal wall hernia.  Now she tells me the pain mostly bothers her at night when she lays down or about 30 minutes after eating.  Described as a burning/aching.  Rated as a 6-7/10.  Denies any nausea or vomiting.    Denies fever, chills, weight loss or blood in her stool.  Past Medical History:  Diagnosis Date   Asthma 2007   Cholelithiasis    History of anemia    History of ectopic pregnancy 2007   History of pica 2010   Hx of carpal tunnel syndrome 2012   Recurrent boils 2010   Sinusitis    Varicosities     Past Surgical History:  Procedure Laterality Date    NO PAST SURGERIES      Current Outpatient Medications  Medication Sig Dispense Refill   acetaminophen (TYLENOL) 325 MG tablet Take 325-650 mg by mouth as needed.     misoprostol (CYTOTEC) 200 MCG tablet Place 4 tablets (800 mcg total) vaginally once for 1 dose. Insert at bedtime. 4 tablet 0   omeprazole (PRILOSEC) 40 MG capsule Take 1 capsule (40 mg total) by mouth daily. 90 capsule 1   pantoprazole (PROTONIX) 40 MG tablet Take 1 tablet (40 mg total) by mouth 2 (two) times daily. 90 tablet 1   promethazine (PHENERGAN) 25 MG tablet Take by mouth.     No current facility-administered medications for this visit.    Allergies as of 11/01/2021 - Review Complete 04/19/2021  Allergen Reaction Noted   Other Anaphylaxis 06/18/2018   Aspirin Other (See Comments) 02/17/2010   Ibuprofen  04/08/2009   Prednisone Nausea Only and Rash 01/28/2020    Family History  Problem Relation Age of Onset   Asthma Daughter     Social History   Socioeconomic History   Marital status: Married    Spouse name: Not on file   Number of children: 8   Years of education: Not on file   Highest education level: Not on file  Occupational History   Not on file  Tobacco Use   Smoking status: Never   Smokeless  tobacco: Never  Vaping Use   Vaping Use: Never used  Substance and Sexual Activity   Alcohol use: No   Drug use: No   Sexual activity: Not Currently  Other Topics Concern   Not on file  Social History Narrative   Not on file   Social Determinants of Health   Financial Resource Strain: Not on file  Food Insecurity: Not on file  Transportation Needs: Not on file  Physical Activity: Not on file  Stress: Not on file  Social Connections: Not on file  Intimate Partner Violence: Not on file    Review of Systems:    Constitutional: No weight loss, fever or chills Cardiovascular: No chest pain Respiratory: No SOB  Gastrointestinal: See HPI and otherwise negative   Physical Exam:  Vital  signs: BP 124/80   Pulse 72   Wt 181 lb (82.1 kg)   SpO2 98%   BMI 36.56 kg/m    Constitutional:   Pleasant Hispanic female appears to be in NAD, Well developed, Well nourished, alert and cooperative  Respiratory: Respirations even and unlabored. Lungs clear to auscultation bilaterally.   No wheezes, crackles, or rhonchi.  Cardiovascular: Normal S1, S2. No MRG. Regular rate and rhythm. No peripheral edema, cyanosis or pallor.  Gastrointestinal:  Soft, nondistended, moderate epigastric TTP. No rebound or guarding. Normal bowel sounds. No appreciable masses or hepatomegaly.  No hernia appreciated Rectal:  Not performed.  Psychiatric:Demonstrates good judgement and reason without abnormal affect or behaviors.  No recent labs or imaging.  Assessment: 1.  Epigastric pain: Ongoing over the past 6 months regardless of increasing Pantoprazole to 40 mg twice a day; consider H. pylori +/- gastritis 2.  GERD: With above  Plan: 1.  Scheduled patient for diagnostic EGD in the [facility] with Dr. Lavon Paganini.  Did provide the patient a detailed list of risks for the procedure and she agrees to proceed. 2.  Continue Pantoprazole 40 mg twice daily, 30-60 minutes before breakfast and dinner.  Patient has this medication at home. 3.  Reviewed antireflux diet and lifestyle modifications. 4.  Patient follow in clinic per recommendations from Dr. Lavon Paganini after time of procedure.  Hyacinth Meeker, PA-C [facility] 11/01/2021, 11:04 AM  Cc: List, Nash Shearer, FNP

## 2021-11-01 NOTE — Patient Instructions (Addendum)
Continuar Pantoprazol 40 mg dos veces al da  Le han programado una endoscopia. Siga las instrucciones escritas que se le dieron en su visita de hoy. Si [place] inhaladores (aunque solo sea necesario), trigalos el da de su procedimiento.  Si tiene 65 aos o ms, su ndice de [employer] corporal debe estar entre 23 y 68. Su ndice de masa corporal es de 36,56 kg/m. Si esto est fuera del rango mencionado anteriormente, considere hacer un seguimiento con su proveedor de Midwife.  Si tiene 98 aos o menos, su ndice de [employer] corporal debe estar entre 54 y 33. Su ndice de masa corporal es de 36,56 kg/m. Si esto est fuera del rango mencionado anteriormente, considere hacer un seguimiento con su proveedor de Midwife.  ________________________________________________________  [employer] de [facility] desean alentarlo a que use MYCHART para comunicarse con los proveedores para solicitudes o preguntas que no sean urgentes. Debido a los Astronomer de espera en el telfono, enviar un mensaje a su proveedor por [employer] puede ser una forma ms rpida y eficiente de obtener una respuesta. Espere 48 horas hbiles para obtener [employer]. Recuerde que esto es para solicitudes no urgentes. _______________________________________________________  Continue Pantoprazole 40 mg twice daily  You have been scheduled for an endoscopy. Please follow written instructions given to you at your visit today. If you use inhalers (even only as needed), please bring them with you on the day of your procedure.  If you are age 35 or older, your body mass index should be between 23-30. Your Body mass index is 36.56 kg/m. If this is out of the aforementioned range listed, please consider follow up with your Primary Care Provider.  If you are age 32 or younger, your body mass index should be between 19-25. Your Body mass index is 36.56 kg/m. If this is out of the aformentioned range listed, please  consider follow up with your Primary Care Provider.   ________________________________________________________  The [facility] providers would like to encourage you to use [facility] to communicate with providers for non-urgent requests or questions.  Due to long hold times on the telephone, sending your provider a message by [facility] may be a faster and more efficient way to get a response.  Please allow 48 business hours for a response.  Please remember that this is for non-urgent requests.  _______________________________________________________

## 2021-11-02 ENCOUNTER — Telehealth: Payer: Self-pay

## 2021-11-02 NOTE — Telephone Encounter (Signed)
EGD time changed to arrive at 7:00 am on 12/02/21 at the [facility]. Patient agrees to this change.

## 2021-12-01 ENCOUNTER — Telehealth: Payer: Self-pay | Admitting: Gastroenterology

## 2021-12-01 NOTE — Telephone Encounter (Signed)
ok 

## 2021-12-01 NOTE — Telephone Encounter (Signed)
Patient called this morning to cancel her Thursday 7:30 a.m. procedure.  She did not give a reason--only that she wasn't going to come and that she would call later to reschedule.

## 2021-12-02 ENCOUNTER — Encounter: Payer: Self-pay | Admitting: Gastroenterology

## 2022-04-21 ENCOUNTER — Telehealth: Payer: Self-pay | Admitting: Physician Assistant

## 2022-04-21 NOTE — Telephone Encounter (Signed)
Patient called state she is having some type of reaction to the Omeprazole medication seeking to go back to Pantoprazole of possible.

## 2022-04-25 MED ORDER — PANTOPRAZOLE SODIUM 40 MG PO TBEC: 40.0000 mg | DELAYED_RELEASE_TABLET | Freq: Two times a day (BID) | ORAL | 1 refills | Status: AC

## 2022-04-25 NOTE — Telephone Encounter (Signed)
Script sent to patient pharmacy

## 2024-05-21 ENCOUNTER — Ambulatory Visit: Payer: Self-pay | Admitting: Gastroenterology
